# Patient Record
Sex: Male | Born: 1935 | Race: White | Hispanic: No | Marital: Married | Smoking: Former smoker
Health system: Southern US, Community
[De-identification: ages and names within clinical notes are randomized; demographics above are authoritative.]

## PROBLEM LIST (undated history)

## (undated) DIAGNOSIS — M1990 Unspecified osteoarthritis, unspecified site: Secondary | ICD-10-CM

## (undated) DIAGNOSIS — K5792 Diverticulitis of intestine, part unspecified, without perforation or abscess without bleeding: Secondary | ICD-10-CM

## (undated) DIAGNOSIS — K219 Gastro-esophageal reflux disease without esophagitis: Secondary | ICD-10-CM

## (undated) DIAGNOSIS — C61 Malignant neoplasm of prostate: Secondary | ICD-10-CM

## (undated) DIAGNOSIS — I10 Essential (primary) hypertension: Secondary | ICD-10-CM

## (undated) DIAGNOSIS — B029 Zoster without complications: Secondary | ICD-10-CM

## (undated) DIAGNOSIS — E7800 Pure hypercholesterolemia, unspecified: Secondary | ICD-10-CM

## (undated) HISTORY — DX: Zoster without complications: B02.9

## (undated) HISTORY — PX: PROSTATE SURGERY: SHX751

## (undated) HISTORY — DX: Essential (primary) hypertension: I10

## (undated) HISTORY — PX: BACK SURGERY: SHX140

## (undated) HISTORY — DX: Malignant neoplasm of prostate: C61

## (undated) HISTORY — DX: Unspecified osteoarthritis, unspecified site: M19.90

## (undated) HISTORY — DX: Diverticulitis of intestine, part unspecified, without perforation or abscess without bleeding: K57.92

## (undated) HISTORY — DX: Pure hypercholesterolemia, unspecified: E78.00

## (undated) HISTORY — DX: Gastro-esophageal reflux disease without esophagitis: K21.9

## (undated) HISTORY — PX: COLONOSCOPY: SHX174

---

## 1958-01-08 HISTORY — PX: APPENDECTOMY: SHX54

## 1958-01-08 HISTORY — PX: TONSILLECTOMY: SUR1361

## 1968-01-09 HISTORY — PX: INGUINAL HERNIA REPAIR: SUR1180

## 1998-02-22 ENCOUNTER — Ambulatory Visit (HOSPITAL_COMMUNITY): Admission: RE | Admit: 1998-02-22 | Discharge: 1998-02-22 | Payer: Self-pay | Admitting: Cardiology

## 2000-06-05 ENCOUNTER — Ambulatory Visit (HOSPITAL_COMMUNITY): Admission: RE | Admit: 2000-06-05 | Discharge: 2000-06-05 | Payer: Self-pay | Admitting: Internal Medicine

## 2000-06-05 ENCOUNTER — Encounter: Payer: Self-pay | Admitting: Internal Medicine

## 2001-07-14 ENCOUNTER — Encounter: Admission: RE | Admit: 2001-07-14 | Discharge: 2001-07-14 | Payer: Self-pay | Admitting: Internal Medicine

## 2001-07-14 ENCOUNTER — Encounter: Payer: Self-pay | Admitting: Internal Medicine

## 2005-08-22 ENCOUNTER — Emergency Department (HOSPITAL_COMMUNITY): Admission: EM | Admit: 2005-08-22 | Discharge: 2005-08-22 | Payer: Self-pay | Admitting: Emergency Medicine

## 2006-03-18 ENCOUNTER — Ambulatory Visit: Payer: Self-pay | Admitting: Internal Medicine

## 2006-05-02 ENCOUNTER — Ambulatory Visit: Payer: Self-pay | Admitting: Internal Medicine

## 2006-05-02 DIAGNOSIS — K648 Other hemorrhoids: Secondary | ICD-10-CM | POA: Insufficient documentation

## 2006-05-02 DIAGNOSIS — K5730 Diverticulosis of large intestine without perforation or abscess without bleeding: Secondary | ICD-10-CM | POA: Insufficient documentation

## 2006-07-08 ENCOUNTER — Inpatient Hospital Stay (HOSPITAL_COMMUNITY): Admission: RE | Admit: 2006-07-08 | Discharge: 2006-07-09 | Payer: Self-pay | Admitting: Urology

## 2006-07-08 ENCOUNTER — Encounter (INDEPENDENT_AMBULATORY_CARE_PROVIDER_SITE_OTHER): Payer: Self-pay | Admitting: Urology

## 2006-07-23 ENCOUNTER — Ambulatory Visit (HOSPITAL_COMMUNITY): Admission: RE | Admit: 2006-07-23 | Discharge: 2006-07-23 | Payer: Self-pay | Admitting: Internal Medicine

## 2007-12-31 DIAGNOSIS — K219 Gastro-esophageal reflux disease without esophagitis: Secondary | ICD-10-CM | POA: Insufficient documentation

## 2007-12-31 DIAGNOSIS — E109 Type 1 diabetes mellitus without complications: Secondary | ICD-10-CM | POA: Insufficient documentation

## 2007-12-31 DIAGNOSIS — I10 Essential (primary) hypertension: Secondary | ICD-10-CM | POA: Insufficient documentation

## 2007-12-31 DIAGNOSIS — M129 Arthropathy, unspecified: Secondary | ICD-10-CM | POA: Insufficient documentation

## 2008-01-08 ENCOUNTER — Ambulatory Visit: Payer: Self-pay | Admitting: Internal Medicine

## 2008-01-08 DIAGNOSIS — Z860100 Personal history of colon polyps, unspecified: Secondary | ICD-10-CM | POA: Insufficient documentation

## 2008-01-08 DIAGNOSIS — K5732 Diverticulitis of large intestine without perforation or abscess without bleeding: Secondary | ICD-10-CM | POA: Insufficient documentation

## 2009-05-16 DIAGNOSIS — D126 Benign neoplasm of colon, unspecified: Secondary | ICD-10-CM | POA: Insufficient documentation

## 2009-05-16 DIAGNOSIS — H9190 Unspecified hearing loss, unspecified ear: Secondary | ICD-10-CM | POA: Insufficient documentation

## 2009-05-16 DIAGNOSIS — C61 Malignant neoplasm of prostate: Secondary | ICD-10-CM | POA: Insufficient documentation

## 2010-05-23 NOTE — Op Note (Signed)
NAMEBRODAN, GREWELL NO.:  [id number]   MEDICAL RECORD NO.:  [id number]          PATIENT TYPE:  INP   LOCATION:  X010                         FACILITY:  [facility]   PHYSICIAN:  Heloise Purpura, MD      DATE OF BIRTH:  September 25, 1935   DATE OF PROCEDURE:  07/08/2006  DATE OF DISCHARGE:                               OPERATIVE REPORT   PREOPERATIVE DIAGNOSIS:  Clinically localized adenocarcinoma of the  prostate.   POSTOPERATIVE DIAGNOSIS:  Clinically localized adenocarcinoma of the  prostate.   PROCEDURE:  Robotic-assisted laparoscopic radical prostatectomy  (bilateral nerve-sparing).   SURGEON:  Crecencio Mc, MD   ASSISTANT:  Terie Purser, MD   ANESTHESIA:  General.   COMPLICATIONS:  None.   ESTIMATED BLOOD LOSS:  150 mL.   INTRAVENOUS FLUIDS:  2300 mL of lactated Ringer's.   SPECIMENS:  Prostate and seminal vesicles.   DISPOSITION:  Specimen to pathology.   DRAINS:  1. 20-French straight catheter.  2. #19 Blake pelvic drain.   INDICATIONS:  Mark Bowen is a 75 year old gentleman with clinical stage  T1C prostate cancer with a PSA of 2.9 and a Gleason score of 3+3=6.  His  pretreatment AUA Symptom Score was 1 with an IIEF Score of 25/25.  After  discussing management options for clinically localized prostate cancer,  the patient elected to proceed with surgical therapy and the above  procedure.  Potential risks and benefits were discussed with the patient  and informed consent was obtained.   DESCRIPTION OF PROCEDURE:  The patient was taken to the operating room  and a general anesthetic was administered.  He was given preoperative  antibiotics, placed in the dorsal lithotomy position, and prepped and  draped in the usual sterile fashion.  Next a preoperative time-out was  performed.  A Foley catheter was then inserted into the bladder.  A site  was selected just to left of the umbilicus for placement of the camera  port.  This was placed using a standard  open Hasson technique.  This  allowed entry into the peritoneal cavity under direct vision without  difficulty.  A 12 mm port was then placed and a pneumoperitoneum was  established.  A 0-degree lens was then used to inspect the abdomen and  there was no evidence of any intra-abdominal injuries or other  abnormalities.  The remaining ports were then placed.  Bilateral 8 mm  robotic ports were placed 16 cm from the pubic symphysis and 10 cm  lateral to the camera port.  An additional 8 mm robotic port was placed  in the far left lateral abdominal wall.  A 5 mm port was placed between  the camera port and the right robotic port.  An additional 12 mm port  was placed in the far right lateral abdominal wall.  All ports were  placed under direct vision and without difficulty.  The surgical cart  was then docked.  With the aid of the cautery scissors, the bladder was  reflected posteriorly allowing entry into the space of Retzius and  identification of the endopelvic fascia.  The endopelvic fascia was  incised from the apex back to the base of the prostate bilaterally and  the underlying levator muscle fibers were swept laterally off the  prostate.  This isolated the dorsal venous complex, which was then  stapled and divided with a 45-mm flex ETS stapler.  The bladder neck was  identified with the aid of Foley catheter manipulation.  The bladder  neck was divided anteriorly and the catheter was exposed.  The catheter  balloon was deflated and the catheter was brought into the operative  field and used to retract the prostate anteriorly.  The posterior  bladder neck was then divided and dissection continued between the  bladder and prostate until the vasa deferentia and seminal vesicles were  identified.  The vasa deferentia were isolated, divided and lifted  anteriorly.  The seminal vesicles were then lifted anteriorly as well  after care was taken to control the seminal vesicle arterial blood   supply.  The space between Denonvilliers fascia and the anterior rectum  was then bluntly developed, thereby isolating the vascular pedicles of  the prostate.  The lateral prostatic fascia was incised bilaterally and  the neurovascular bundles were swept laterally and posteriorly off the  prostate.  The vascular pedicles of the prostate were then ligated with  Hem-o-lok clips above the level of the neurovascular bundles and divided  with sharp cold scissor dissection.  The neurovascular bundles were  swept off the apex of the prostate and urethra and the urethra was  sharply divided, allowing the prostate to be disarticulated.  The pelvis  was then copiously irrigated and hemostasis was ensured.  With  irrigation in the pelvis, air was injected into the rectal catheter and  there was no evidence of a rectal injury.  There was noted to be an  arterial bleeding site off the right neurovascular bundle.  This was  oversewn with a figure-of-eight 3-0 Vicryl suture.  The remainder of  this 3-0 Vicryl suture was then used to reapproximate Denonvilliers  fascia to the posterior urethral tissue.  A 2-0 Vicryl slip-knot was  then placed at the 6 o'clock position between the bladder neck and  urethra to reapproximate these structures.  A double-armed 3-0 Monocryl  suture was then used to perform a 360-degree running tension-free  anastomosis between the bladder neck and urethra.  A 20-French Coude  catheter was then inserted into the bladder and the catheter was  irrigated.  There were no blood clots within the bladder and the  anastomosis appeared to be watertight.  A #19 Blake drain was then  brought through the left robotic port and appropriately positioned in  the pelvis.  It was secured at the skin with a nylon suture.  The  surgical cart was then undocked.  The right lateral 12 mm port site was  closed with a 0 Vicryl suture placed with the aid of the suture passer  device.  All remaining ports  were removed under direct vision.  The  prostate specimen was then removed intact within the Endopouch retrieval  bag via the periumbilical port site.  This fascial opening was then  closed with a running 0 Vicryl suture.  All port sites were injected  with 0.25% Marcaine and reapproximated at the skin level with staples.  Sterile dressings were applied.  The patient appeared to tolerate the  procedure well and without complications.  He was able to be extubated  and transferred to the recovery unit in satisfactory  condition.           ______________________________  Heloise Purpura, MD  Electronically Signed     LB/MEDQ  D:  07/08/2006  T:  07/09/2006  Job:  [id number]

## 2010-05-23 NOTE — H&P (Signed)
NAME:  Mark Bowen, Mark Bowen NO.:  [id number]   MEDICAL RECORD NO.:  [id number]          PATIENT TYPE:  INP   LOCATION:  NA                           FACILITY:  [facility]   PHYSICIAN:  Heloise Purpura, MD      DATE OF BIRTH:  09-24-1935   DATE OF ADMISSION:  DATE OF DISCHARGE:                              HISTORY & PHYSICAL   CHIEF COMPLAINT:  Prostate cancer.   HISTORY:  Mark Bowen is a 75 year old gentleman with clinical stage T1c  prostate cancer with a PSA of 2.9 and Gleason score 3 + 3 = 6  adenocarcinoma.  After discussion regarding management options for  clinically localized prostate cancer, the patient elected to proceed  with surgical therapy and a robotic prostatectomy.   PAST MEDICAL HISTORY:  1. Hypertension.  2. Diabetes.  3. Arthritis.  4. Gastroesophageal reflux disease.   PAST SURGICAL HISTORY:  1. Bilateral inguinal hernia repair.  2. Appendectomy.  3. Back surgery.  4. Tonsillectomy.   MEDICATIONS:  1. Lovastatin.  2. Metformin.  3. Omeprazole.  4. Benazepril/hydrochlorothiazide.  5. Aspirin, which is on hold.  6. Multivitamin, which is on hold.  7. Potassium supplement.  8. Cinnamon supplement, which is on hold.  9. Omega 3, which is on hold.  10.Fish oil, which is on hold.  11.Glucosamine, which is on hold.   ALLERGIES:  IV CONTRAST, which results in hives.   FAMILY HISTORY:  There is an extensive family history for prostate  cancer.  The patient has three brothers who have all been diagnosed with  prostate cancer.  The patient's father also had prostate cancer.  No  family members have died of prostate cancer.  His mother did live to be  age 52.   SOCIAL HISTORY:  The patient is currently retired.  He does work in  Animal nutritionist.  He did smoke one pack of cigarettes for 8 years  and quit 4-5 years ago.  He drinks alcohol only occasionally.   REVIEW OF SYSTEMS:  A complete review of systems was performed.  Pertinent positives  include history of sinus problems, joint pain and  heartburn.  All other systems are reviewed and otherwise negative.   PHYSICAL EXAMINATION:  CONSTITUTIONAL:  Well-nourished, well-developed,  age-appropriate male in no acute distress.  CARDIOVASCULAR:  Regular rate and rhythm without obvious murmurs.  LUNGS:  Clear bilaterally.  ABDOMEN:  Soft, nontender, nondistended without abdominal masses.  He  has well-healed right lower quadrant appendectomy scar.  DIGITAL RECTAL EXAM:  No nodularity or induration.   IMPRESSION:  Clinically localized adenocarcinoma of prostate.   PLAN:  Mark Bowen will undergo robotic-assisted laparoscopic radical  prostatectomy and then be admitted to the hospital for routine  postoperative care.           ______________________________  Heloise Purpura, MD  Electronically Signed     LB/MEDQ  D:  07/08/2006  T:  07/08/2006  Job:  [id number]

## 2010-05-23 NOTE — Discharge Summary (Signed)
NAMETEAGON, KRON NO.:  [id number]   MEDICAL RECORD NO.:  [id number]          PATIENT TYPE:  INP   LOCATION:  1409                         FACILITY:  [facility]   PHYSICIAN:  Heloise Purpura, MD      DATE OF BIRTH:  1935/04/05   DATE OF ADMISSION:  07/08/2006  DATE OF DISCHARGE:  07/09/2006                               DISCHARGE SUMMARY   ADMISSION DIAGNOSIS:  Prostate cancer.   DISCHARGE DIAGNOSIS:  Prostate cancer.   PROCEDURE:  Robotic-assisted laparoscopic radical prostatectomy.   HISTORY AND PHYSICAL:  For full details, please see admission history  and physical.  Briefly, Mr. Sailors is a 75 year old gentleman with  clinically localized prostate cancer.  After discussing management  options for clinically localized disease, he elected to proceed with  surgical therapy and a robotic-assisted laparoscopic radical  prostatectomy.   HOSPITAL COURSE:  On 07/08/2006, the patient was taken to the operating  room, and the above procedure was performed.  The patient tolerated this  procedure without difficulty and without complications.  Postoperatively, he was able to be transferred to a regular hospital  room following recovery from anesthesia.  He was able to begin  ambulating the night of surgery.  On the morning of postoperative day  #1, he had maintained excellent urine output from his Foley catheter,  with minimal output from his pelvic drain.  Pelvic drain was therefore  removed.  He was able to begin a clear liquid diet which he tolerated  without difficulty, and he was transitioned to oral pain medication.  He  was instructed on routine Foley catheter care.  In addition, he remained  hemodynamically stable, and his hemoglobin levels hemoglobin levels were  adequate on postoperative day #1.  He was therefore discharged home in  excellent condition.   DISPOSITION:  Home.   DISCHARGE MEDICATIONS:  The patient was instructed to resume his regular  home  medications excepting any aspirin, nonsteroidal anti-inflammatory  drugs, or herbal supplements.  He was given a prescription to take  Vicodin as needed for pain, told to take Colace as a stool softener, and  was instructed to begin Cipro 1 day prior to his return visit for Foley  catheter removal.   DISCHARGE INSTRUCTIONS:  The patient was instructed to be ambulatory but  specifically told to refrain from any heavy lifting, strenuous activity,  or driving.  He was told to gradually advance his diet, once passing  flatus.  He was also instructed on routine Foley catheter care.   FOLLOW UP:  Mr. Madan will follow-up in 1 week for Foley catheter  removal and to discuss his surgical pathology in detail.   ADDENDUM:  Mr. Michalski was noted on his preoperative chest x-ray to have a  possible lung mass.  A repeat chest x-ray performed with nipple markers  demonstrated this lesion to be most likely due to a nipple shadow.  However,  there was noted to be some increased markings suggestive of increased  vascularity, and it was recommended that this be further evaluated.  I,  therefore, discussed this with Mr.  Berkheimer and recommended that Dr. Waynard Edwards  be allowed to follow up on this and determine further evaluation.  I  will plan to send Dr. Waynard Edwards a copy of his chest x-ray report.           ______________________________  Heloise Purpura, MD  Electronically Signed     LB/MEDQ  D:  07/09/2006  T:  07/09/2006  Job:  [id number]

## 2010-10-06 ENCOUNTER — Other Ambulatory Visit: Payer: Self-pay | Admitting: Dermatology

## 2010-10-24 LAB — HEMOGLOBIN AND HEMATOCRIT, BLOOD
HCT: 36.5 — ABNORMAL LOW
Hemoglobin: 12.5 — ABNORMAL LOW

## 2010-10-25 LAB — URINALYSIS, ROUTINE W REFLEX MICROSCOPIC
Ketones, ur: NEGATIVE
Protein, ur: NEGATIVE
Urobilinogen, UA: 0.2

## 2010-10-25 LAB — CBC
MCHC: 34.6
Platelets: 206
RBC: 5.34
WBC: 6.2

## 2010-10-25 LAB — BASIC METABOLIC PANEL
BUN: 10
CO2: 31
Calcium: 9.8
Creatinine, Ser: 0.92
GFR calc Af Amer: >60

## 2010-10-25 LAB — TYPE AND SCREEN: ABO/RH(D): O POS

## 2010-10-25 LAB — ABO/RH: ABO/RH(D): O POS

## 2011-02-14 ENCOUNTER — Encounter: Payer: Self-pay | Admitting: Internal Medicine

## 2011-02-27 ENCOUNTER — Encounter: Payer: Self-pay | Admitting: Internal Medicine

## 2011-04-04 ENCOUNTER — Ambulatory Visit (AMBULATORY_SURGERY_CENTER): Payer: Medicare Other

## 2011-04-04 VITALS — Ht 69.0 in | Wt 177.0 lb

## 2011-04-04 DIAGNOSIS — Z1211 Encounter for screening for malignant neoplasm of colon: Secondary | ICD-10-CM

## 2011-04-04 DIAGNOSIS — Z860100 Personal history of colon polyps, unspecified: Secondary | ICD-10-CM

## 2011-04-04 MED ORDER — PEG-KCL-NACL-NASULF-NA ASC-C 100 G PO SOLR: 1.0000 | Freq: Once | ORAL | Status: DC

## 2011-04-18 ENCOUNTER — Encounter: Payer: Self-pay | Admitting: Internal Medicine

## 2011-04-18 ENCOUNTER — Ambulatory Visit (AMBULATORY_SURGERY_CENTER): Payer: Medicare Other | Admitting: Internal Medicine

## 2011-04-18 VITALS — BP 136/80 | HR 57 | Temp 97.4°F | Resp 14 | Ht 69.0 in | Wt 177.0 lb

## 2011-04-18 DIAGNOSIS — Z1211 Encounter for screening for malignant neoplasm of colon: Secondary | ICD-10-CM

## 2011-04-18 DIAGNOSIS — Z86010 Personal history of colonic polyps: Secondary | ICD-10-CM

## 2011-04-18 DIAGNOSIS — K5730 Diverticulosis of large intestine without perforation or abscess without bleeding: Secondary | ICD-10-CM

## 2011-04-18 LAB — GLUCOSE, CAPILLARY: Glucose-Capillary: 127 mg/dL — ABNORMAL HIGH (ref 70–99)

## 2011-04-18 MED ORDER — SODIUM CHLORIDE 0.9 % IV SOLN: 500.0000 mL | INTRAVENOUS | Status: DC

## 2011-04-18 NOTE — Progress Notes (Signed)
Patient did not experience any of the following events: a burn prior to discharge; a fall within the facility; wrong site/side/patient/procedure/implant event; or a hospital transfer or hospital admission upon discharge from the facility. (G8907) Patient did not have preoperative order for IV antibiotic SSI prophylaxis. (G8918)  

## 2011-04-18 NOTE — Patient Instructions (Signed)
YOU HAD AN ENDOSCOPIC PROCEDURE TODAY AT THE [facility]: Refer to the procedure report that was given to you for any specific questions about what was found during the examination.  If the procedure report does not answer your questions, please call your gastroenterologist to clarify.  If you requested that your care partner not be given the details of your procedure findings, then the procedure report has been included in a sealed envelope for you to review at your convenience later.  YOU SHOULD EXPECT: Some feelings of bloating in the abdomen. Passage of more gas than usual.  Walking can help get rid of the air that was put into your GI tract during the procedure and reduce the bloating. If you had a lower endoscopy (such as a colonoscopy or flexible sigmoidoscopy) you may notice spotting of blood in your stool or on the toilet paper. If you underwent a bowel prep for your procedure, then you may not have a normal bowel movement for a few days.  DIET: Your first meal following the procedure should be a light meal and then it is ok to progress to your normal diet.  A half-sandwich or bowl of soup is an example of a good first meal.  Heavy or fried foods are harder to digest and may make you feel nauseous or bloated.  Likewise meals heavy in dairy and vegetables can cause extra gas to form and this can also increase the bloating.  Drink plenty of fluids but you should avoid alcoholic beverages for 24 hours.  ACTIVITY: Your care partner should take you home directly after the procedure.  You should plan to take it easy, moving slowly for the rest of the day.  You can resume normal activity the day after the procedure however you should NOT DRIVE or use heavy machinery for 24 hours (because of the sedation medicines used during the test).    SYMPTOMS TO REPORT IMMEDIATELY: A gastroenterologist can be reached at any hour.  During normal business hours, 8:30 AM to 5:00 PM Monday through Friday,  call [phone].  After hours and on weekends, please call the GI answering service at [phone] who will take a message and have the physician on call contact you.   Following lower endoscopy (colonoscopy or flexible sigmoidoscopy):  Excessive amounts of blood in the stool  Significant tenderness or worsening of abdominal pains  Swelling of the abdomen that is new, acute  Fever of 100F or higher  Following upper endoscopy (EGD)  Vomiting of blood or coffee ground material  New chest pain or pain under the shoulder blades  Painful or persistently difficult swallowing  New shortness of breath  Fever of 100F or higher  Black, tarry-looking stools  FOLLOW UP: If any biopsies were taken you will be contacted by phone or by letter within the next 1-3 weeks.  Call your gastroenterologist if you have not heard about the biopsies in 3 weeks.  Our staff will call the home number listed on your records the next business day following your procedure to check on you and address any questions or concerns that you may have at that time regarding the information given to you following your procedure. This is a courtesy call and so if there is no answer at the home number and we have not heard from you through the emergency physician on call, we will assume that you have returned to your regular daily activities without incident.  SIGNATURES/CONFIDENTIALITY: You and/or your care   partner have signed paperwork which will be entered into your electronic medical record.  These signatures attest to the fact that that the information above on your After Visit Summary has been reviewed and is understood.  Full responsibility of the confidentiality of this discharge information lies with you and/or your care-partner.  

## 2011-04-18 NOTE — Op Note (Signed)
[facility] [address] [place], [place]  [address]  COLONOSCOPY PROCEDURE REPORT  PATIENT:  Mark Bowen, Mark Bowen  MR#:  [id number] BIRTHDATE:  1935-05-12, 75 yrs. old  GENDER:  male ENDOSCOPIST:  Wilhemina Bonito. Eda Keys, MD REF. BY:  Surveillance Program Recall, PROCEDURE DATE:  04/18/2011 PROCEDURE:  Surveillance Colonoscopy ASA CLASS:  Class II INDICATIONS:  history of pre-cancerous (adenomatous) colon polyps, surveillance and high-risk screening ; index 2004 (TA); f/u 2008 MEDICATIONS:   MAC sedation, administered by CRNA, propofol (Diprivan) 180 mg IV  DESCRIPTION OF PROCEDURE:   After the risks benefits and alternatives of the procedure were thoroughly explained, informed consent was obtained.  Digital rectal exam was performed and revealed no abnormalities.   The LB CF-H180AL [id number] endoscope was introduced through the anus and advanced to the cecum, which was identified by both the appendix and ileocecal valve, without limitations.  The quality of the prep was excellent, using MoviPrep.  The instrument was then slowly withdrawn as the colon was fully examined. <<PROCEDUREIMAGES>>  FINDINGS:  Moderate diverticulosis was found throughout the colon. A tiny A.V. 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  Otherwise normal colonoscopy without polyps, masses, vascular ectasias, or inflammatory changes. Retroflexed views in the rectum revealed internal hemorrhoids.  The time to cecum = 2:19  minutes. The scope was then withdrawn in 11:11  minutes from the cecum and the procedure completed.  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 Eda Keys, MD  CC:  Rodrigo Ran, MD;  The Patient  n. eSIGNED:   Wilhemina Bonito. Eda Keys at 04/18/2011 10:32 AM  Joselyn Glassman, [id number]

## 2011-04-19 ENCOUNTER — Telehealth: Payer: Self-pay | Admitting: *Deleted

## 2011-04-19 NOTE — Telephone Encounter (Signed)
  Follow up Call-  Call back number 04/18/2011  Post procedure Call Back phone  # [phone]  Permission to leave phone message Yes     Patient questions:  Do you have a fever, pain , or abdominal swelling? no Pain Score  0 *  Have you tolerated food without any problems? yes  Have you been able to return to your normal activities? yes  Do you have any questions about your discharge instructions: Diet   no Medications  no Follow up visit  no  Do you have questions or concerns about your Care? no  Actions: * If pain score is 4 or above: No action needed, pain <4.

## 2011-10-29 DIAGNOSIS — R5383 Other fatigue: Secondary | ICD-10-CM | POA: Insufficient documentation

## 2013-11-30 DIAGNOSIS — F329 Major depressive disorder, single episode, unspecified: Secondary | ICD-10-CM | POA: Insufficient documentation

## 2014-06-02 ENCOUNTER — Encounter: Payer: Self-pay | Admitting: Podiatry

## 2014-06-02 ENCOUNTER — Ambulatory Visit (INDEPENDENT_AMBULATORY_CARE_PROVIDER_SITE_OTHER): Payer: Medicare Other | Admitting: Podiatry

## 2014-06-02 VITALS — BP 120/74 | HR 64 | Resp 12

## 2014-06-02 DIAGNOSIS — B351 Tinea unguium: Secondary | ICD-10-CM

## 2014-06-02 DIAGNOSIS — M79676 Pain in unspecified toe(s): Secondary | ICD-10-CM | POA: Diagnosis not present

## 2014-06-02 NOTE — Patient Instructions (Signed)
We discussed treatment options for fungal toenails including no treatment , repetitive debridement or possible oral medication  Diabetes and Foot Care Diabetes may cause you to have problems because of poor blood supply (circulation) to your feet and legs. This may cause the skin on your feet to become thinner, break easier, and heal more slowly. Your skin may become dry, and the skin may peel and crack. You may also have nerve damage in your legs and feet causing decreased feeling in them. You may not notice minor injuries to your feet that could lead to infections or more serious problems. Taking care of your feet is one of the most important things you can do for yourself.  HOME CARE INSTRUCTIONS  Wear shoes at all times, even in the house. Do not go barefoot. Bare feet are easily injured.  Check your feet daily for blisters, cuts, and redness. If you cannot see the bottom of your feet, use a mirror or ask someone for help.  Wash your feet with warm water (do not use hot water) and mild soap. Then pat your feet and the areas between your toes until they are completely dry. Do not soak your feet as this can dry your skin.  Apply a moisturizing lotion or petroleum jelly (that does not contain alcohol and is unscented) to the skin on your feet and to dry, brittle toenails. Do not apply lotion between your toes.  Trim your toenails straight across. Do not dig under them or around the cuticle. File the edges of your nails with an emery board or nail file.  Do not cut corns or calluses or try to remove them with medicine.  Wear clean socks or stockings every day. Make sure they are not too tight. Do not wear knee-high stockings since they may decrease blood flow to your legs.  Wear shoes that fit properly and have enough cushioning. To break in new shoes, wear them for just a few hours a day. This prevents you from injuring your feet. Always look in your shoes before you put them on to be sure there  are no objects inside.  Do not cross your legs. This may decrease the blood flow to your feet.  If you find a minor scrape, cut, or break in the skin on your feet, keep it and the skin around it clean and dry. These areas may be cleansed with mild soap and water. Do not cleanse the area with peroxide, alcohol, or iodine.  When you remove an adhesive bandage, be sure not to damage the skin around it.  If you have a wound, look at it several times a day to make sure it is healing.  Do not use heating pads or hot water bottles. They may burn your skin. If you have lost feeling in your feet or legs, you may not know it is happening until it is too late.  Make sure your health care provider performs a complete foot exam at least annually or more often if you have foot problems. Report any cuts, sores, or bruises to your health care provider immediately. SEEK MEDICAL CARE IF:   You have an injury that is not healing.  You have cuts or breaks in the skin.  You have an ingrown nail.  You notice redness on your legs or feet.  You feel burning or tingling in your legs or feet.  You have pain or cramps in your legs and feet.  Your legs or feet are  numb.  Your feet always feel cold. SEEK IMMEDIATE MEDICAL CARE IF:   There is increasing redness, swelling, or pain in or around a wound.  There is a red line that goes up your leg.  Pus is coming from a wound.  You develop a fever or as directed by your health care provider.  You notice a bad smell coming from an ulcer or wound. Document Released: 12/23/1999 Document Revised: 08/27/2012 Document Reviewed: 06/03/2012 [facility] Patient Information 2015 [place], [place]. This information is not intended to replace advice given to you by your health care provider. Make sure you discuss any questions you have with your health care provider.

## 2014-06-02 NOTE — Progress Notes (Signed)
   Subjective:    Patient ID: Mark Bowen, male    DOB: 03-Feb-1935, 79 y.o.   MRN: [id number]  HPI  N-THICK, DISCOLORATION L-B/L TOENAILS D-LONG-TERM O-SLOWLY C-WORSE, THICKER A-PRESSURE WITH SHOES T-TRIM  Review of Systems  All other systems reviewed and are negative.  Patient denies any history of foot ulceration or claudication    Objective:   Physical Exam  Orientated 3  Vascular: DP and PT pulses 2/4 bilaterally Capillary reflex immediate bilaterally No peripheral edema noted bilaterally  Neurological: Sensation to 10 g monofilament wire intact 5/5 bilaterally Ankle reflex equal and reactive bilaterally Vibratory sensation intact bilaterally  Dermatological: Dry scaling skin plantarly bilaterally The toenails are elongated, discolored, brittle, hypertrophic and tender to direct palpation 6-10  Musculoskeletal: HAV deformities bilaterally Hammertoe second bilaterally There is no restriction ankle, subtalar, midtarsal joints bilaterally    Assessment & Plan:   Assessment: Satisfactory neurovascular status Tinea pedis bilaterally Symptomatic onychomycoses 6-10  Plan: Review the results with patient today Discussed treatment options for fungal toenails and at this time patient is requesting nail debridement without active treatment Debride toenails 10 without any bleeding  Reappoint 3 months

## 2014-07-14 DIAGNOSIS — R1032 Left lower quadrant pain: Secondary | ICD-10-CM | POA: Insufficient documentation

## 2014-08-10 DIAGNOSIS — M4716 Other spondylosis with myelopathy, lumbar region: Secondary | ICD-10-CM | POA: Insufficient documentation

## 2014-09-08 ENCOUNTER — Ambulatory Visit: Payer: Medicare Other | Admitting: Podiatry

## 2015-01-06 ENCOUNTER — Encounter: Payer: Self-pay | Admitting: Internal Medicine

## 2016-03-02 ENCOUNTER — Encounter: Payer: Self-pay | Admitting: Internal Medicine

## 2016-04-02 ENCOUNTER — Ambulatory Visit (INDEPENDENT_AMBULATORY_CARE_PROVIDER_SITE_OTHER): Payer: Medicare Other | Admitting: Internal Medicine

## 2016-04-02 ENCOUNTER — Encounter (INDEPENDENT_AMBULATORY_CARE_PROVIDER_SITE_OTHER): Payer: Self-pay

## 2016-04-02 ENCOUNTER — Encounter: Payer: Self-pay | Admitting: Internal Medicine

## 2016-04-02 VITALS — BP 136/74 | HR 68 | Ht 68.0 in | Wt 168.0 lb

## 2016-04-02 DIAGNOSIS — K219 Gastro-esophageal reflux disease without esophagitis: Secondary | ICD-10-CM | POA: Diagnosis not present

## 2016-04-02 DIAGNOSIS — R197 Diarrhea, unspecified: Secondary | ICD-10-CM

## 2016-04-02 DIAGNOSIS — Z86010 Personal history of colonic polyps: Secondary | ICD-10-CM | POA: Diagnosis not present

## 2016-04-02 DIAGNOSIS — K5732 Diverticulitis of large intestine without perforation or abscess without bleeding: Secondary | ICD-10-CM

## 2016-04-02 NOTE — Progress Notes (Signed)
HISTORY OF PRESENT ILLNESS:  Mark Bowen is a 81 y.o. male , Mark Bowen, with a history of hypertension, diabetes, arthritis, GERD, prostate cancer, adenomatous colon polyps, and diverticulosis complicated by diverticulitis. The patient presents today with a chief complaint of recurrent diverticulitis and transient diarrhea. He was last seen in 2013. He is sent by his primary care provider Dr. Joylene Draft. The patient underwent index colonoscopy 2004 and was found to have diverticulosis and adenomatous colon polyp. Follow-up exam in 2008 negative for neoplasia. Seen in the office 2009 for recurrent diverticulitis. CT confirmation of the same. Had been doing well until this past year when he has experienced 3 episodes of recurrent diverticulitis requiring antibiotics. After his most recent bout he had difficulties with diarrhea. Clostridium difficile negative. Empirically treated with Flagyl. The probiotic. Diarrhea has resolved. No further problems with pain. He has questions regarding diverticular disease. A friendly his had surgery. His last colonoscopy was in 2013. 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 Moderate diverticulosis throughout. No neoplasia. Follow-up in 5 years recommended. Patient also has a history of GERD which he takes omeprazole daily. No breakthrough symptoms. No dysphagia  REVIEW OF SYSTEMS:  All non-GI ROS negative except for urine problems, itching, arthritis  Past Medical History:  Diagnosis Date  . Arthritis   . Diabetes mellitus 2007   type 2  . Diverticulitis   . GERD (gastroesophageal reflux disease)   . Hypercholesterolemia since 2008 preventative   taking Zocor, now cholesterol WNL  . Hypertension since 1970  . Prostate CA ([place])   . Shingles     Past Surgical History:  Procedure Laterality Date  . APPENDECTOMY  1960  . [place] & 2009  . INGUINAL HERNIA REPAIR  1970   bilateral  . PROSTATE SURGERY    . TONSILLECTOMY  1960    Social History Mark Bowen   reports that he quit smoking about 57 years ago. He has never used smokeless tobacco. He reports that he does not drink alcohol or use drugs.  family history includes Lung cancer in his father; Prostate cancer in his brother.  Allergies  Allergen Reactions  . Iodine   . Iohexol      Code: HIVES, Desc: pt. states he breaks out in hives with iv dye        PHYSICAL EXAMINATION: Vital signs: BP 136/74   Pulse 68   Ht 5\' 8"  (1.727 m)   Wt 168 lb (76.2 kg)   BMI 25.54 kg/m   Constitutional: generally well-appearing, no acute distress Psychiatric: alert and oriented x3, cooperative Eyes: extraocular movements intact, anicteric, conjunctiva pink Mouth: oral pharynx moist, no lesions Neck: supple no lymphadenopathy Cardiovascular: heart regular rate and rhythm, no murmur Lungs: clear to auscultation bilaterally Abdomen: soft, nontender, nondistended, no obvious ascites, no peritoneal signs, normal bowel sounds, no organomegaly Rectal:Deferred until colonoscopy Extremities: no clubbing cyanosis or lower extremity edema bilaterally Skin: no lesions on visible extremities Neuro: No focal deficits. Normal DTRs. Cranial nerves intact  ASSESSMENT:  #1. Recurrent diverticulitis without complications. Currently asymptomatic #2. Transient diarrhea. Antibiotic-related. Resolved. Discussed #3. History of adenomatous colon polyps. Last colonoscopy 5 years ago. Due for surveillance #4. GERD. Asymptomatic on PPI  PLAN:  #1. Discussion on diverticular disease including the role of surgery #2. Recommend fiber supplementation with Metamucil one to 2 tablespoons daily #3. If the patient experiences a significant recurrent bout of diverticulitis would recommend CT imaging to rule out any complicating features #4. Schedule surveillance colonoscopy.The nature  of the procedure, as well as the risks, benefits, and alternatives were carefully and thoroughly reviewed with the patient. Ample time for  discussion and questions allowed. The patient understood, was satisfied, and agreed to proceed. #5. Hold diabetic medications the day of the examination to avoid and wanted hypoglycemia  A copy of this consultation note has been sent to Dr. Joylene Draft

## 2016-04-02 NOTE — Patient Instructions (Signed)
If you are age 79 or older, your body mass index should be between 23-30. Your Body mass index is 41.45 kg/m. If this is out of the aforementioned range listed, please consider follow up with your Primary Care Provider.  If you are age 43 or younger, your body mass index should be between 19-25. Your Body mass index is 41.45 kg/m. If this is out of the aformentioned range listed, please consider follow up with your Primary Care Provider.   1-2 tablespoons of metamucil daily.  Thank you for choosing [facility]

## 2016-06-10 ENCOUNTER — Inpatient Hospital Stay (HOSPITAL_COMMUNITY)
Admission: EM | Admit: 2016-06-10 | Discharge: 2016-06-15 | DRG: 871 | Disposition: A | Discharge status: Home / Self Care | Payer: Medicare Other | Attending: Internal Medicine | Admitting: Internal Medicine

## 2016-06-10 ENCOUNTER — Emergency Department (HOSPITAL_COMMUNITY): Payer: Medicare Other

## 2016-06-10 ENCOUNTER — Encounter (HOSPITAL_COMMUNITY): Payer: Self-pay | Admitting: Emergency Medicine

## 2016-06-10 DIAGNOSIS — R05 Cough: Secondary | ICD-10-CM

## 2016-06-10 DIAGNOSIS — A419 Sepsis, unspecified organism: Secondary | ICD-10-CM | POA: Diagnosis present

## 2016-06-10 DIAGNOSIS — B349 Viral infection, unspecified: Secondary | ICD-10-CM | POA: Diagnosis present

## 2016-06-10 DIAGNOSIS — E7800 Pure hypercholesterolemia, unspecified: Secondary | ICD-10-CM | POA: Diagnosis present

## 2016-06-10 DIAGNOSIS — Z87891 Personal history of nicotine dependence: Secondary | ICD-10-CM | POA: Diagnosis not present

## 2016-06-10 DIAGNOSIS — R059 Cough, unspecified: Secondary | ICD-10-CM

## 2016-06-10 DIAGNOSIS — K219 Gastro-esophageal reflux disease without esophagitis: Secondary | ICD-10-CM | POA: Diagnosis present

## 2016-06-10 DIAGNOSIS — J209 Acute bronchitis, unspecified: Secondary | ICD-10-CM | POA: Diagnosis present

## 2016-06-10 DIAGNOSIS — E872 Acidosis: Secondary | ICD-10-CM | POA: Diagnosis present

## 2016-06-10 DIAGNOSIS — Z7982 Long term (current) use of aspirin: Secondary | ICD-10-CM

## 2016-06-10 DIAGNOSIS — Z7984 Long term (current) use of oral hypoglycemic drugs: Secondary | ICD-10-CM | POA: Diagnosis not present

## 2016-06-10 DIAGNOSIS — R509 Fever, unspecified: Secondary | ICD-10-CM | POA: Insufficient documentation

## 2016-06-10 DIAGNOSIS — Z91041 Radiographic dye allergy status: Secondary | ICD-10-CM | POA: Diagnosis not present

## 2016-06-10 DIAGNOSIS — M1990 Unspecified osteoarthritis, unspecified site: Secondary | ICD-10-CM | POA: Diagnosis present

## 2016-06-10 DIAGNOSIS — G0490 Encephalitis and encephalomyelitis, unspecified: Secondary | ICD-10-CM | POA: Diagnosis present

## 2016-06-10 DIAGNOSIS — E785 Hyperlipidemia, unspecified: Secondary | ICD-10-CM | POA: Diagnosis present

## 2016-06-10 DIAGNOSIS — Z8546 Personal history of malignant neoplasm of prostate: Secondary | ICD-10-CM | POA: Diagnosis not present

## 2016-06-10 DIAGNOSIS — M791 Myalgia: Secondary | ICD-10-CM | POA: Diagnosis not present

## 2016-06-10 DIAGNOSIS — D696 Thrombocytopenia, unspecified: Secondary | ICD-10-CM | POA: Diagnosis present

## 2016-06-10 DIAGNOSIS — E871 Hypo-osmolality and hyponatremia: Secondary | ICD-10-CM | POA: Diagnosis present

## 2016-06-10 DIAGNOSIS — R519 Headache, unspecified: Secondary | ICD-10-CM

## 2016-06-10 DIAGNOSIS — Z888 Allergy status to other drugs, medicaments and biological substances status: Secondary | ICD-10-CM | POA: Diagnosis not present

## 2016-06-10 DIAGNOSIS — R410 Disorientation, unspecified: Secondary | ICD-10-CM | POA: Diagnosis not present

## 2016-06-10 DIAGNOSIS — Z779 Other contact with and (suspected) exposures hazardous to health: Secondary | ICD-10-CM | POA: Diagnosis not present

## 2016-06-10 DIAGNOSIS — E119 Type 2 diabetes mellitus without complications: Secondary | ICD-10-CM

## 2016-06-10 DIAGNOSIS — W57XXXA Bitten or stung by nonvenomous insect and other nonvenomous arthropods, initial encounter: Secondary | ICD-10-CM | POA: Diagnosis present

## 2016-06-10 DIAGNOSIS — I10 Essential (primary) hypertension: Secondary | ICD-10-CM | POA: Diagnosis present

## 2016-06-10 DIAGNOSIS — R51 Headache: Secondary | ICD-10-CM

## 2016-06-10 DIAGNOSIS — Z8042 Family history of malignant neoplasm of prostate: Secondary | ICD-10-CM | POA: Diagnosis not present

## 2016-06-10 DIAGNOSIS — E876 Hypokalemia: Secondary | ICD-10-CM | POA: Diagnosis not present

## 2016-06-10 DIAGNOSIS — Z801 Family history of malignant neoplasm of trachea, bronchus and lung: Secondary | ICD-10-CM | POA: Diagnosis not present

## 2016-06-10 DIAGNOSIS — E860 Dehydration: Secondary | ICD-10-CM | POA: Diagnosis present

## 2016-06-10 DIAGNOSIS — A4189 Other specified sepsis: Principal | ICD-10-CM | POA: Diagnosis present

## 2016-06-10 DIAGNOSIS — Z86010 Personal history of colonic polyps: Secondary | ICD-10-CM | POA: Diagnosis not present

## 2016-06-10 LAB — CSF CELL COUNT WITH DIFFERENTIAL
RBC Count, CSF: 0 /mm3
RBC Count, CSF: 1 /mm3 — ABNORMAL HIGH
Tube #: 1
Tube #: 4
WBC, CSF: 1 /mm3 (ref 0–5)
WBC, CSF: 3 /mm3 (ref 0–5)

## 2016-06-10 LAB — CBC WITH DIFFERENTIAL/PLATELET
BASOS ABS: 0 10*3/uL (ref 0.0–0.1)
BASOS PCT: 0 %
EOS ABS: 0 10*3/uL (ref 0.0–0.7)
EOS PCT: 0 %
HCT: 45 % (ref 39.0–52.0)
Hemoglobin: 15.8 g/dL (ref 13.0–17.0)
LYMPHS PCT: 7 %
Lymphs Abs: 0.6 10*3/uL — ABNORMAL LOW (ref 0.7–4.0)
MCH: 30.3 pg (ref 26.0–34.0)
MCHC: 35.1 g/dL (ref 30.0–36.0)
MCV: 86.4 fL (ref 78.0–100.0)
MONO ABS: 0.9 10*3/uL (ref 0.1–1.0)
Monocytes Relative: 10 %
Neutro Abs: 7.4 10*3/uL (ref 1.7–7.7)
Neutrophils Relative %: 83 %
PLATELETS: 159 10*3/uL (ref 150–400)
RBC: 5.21 MIL/uL (ref 4.22–5.81)
RDW: 12.9 % (ref 11.5–15.5)
WBC: 8.9 10*3/uL (ref 4.0–10.5)

## 2016-06-10 LAB — RESPIRATORY PANEL BY PCR

## 2016-06-10 LAB — GLUCOSE, CSF: Glucose, CSF: 84 mg/dL — ABNORMAL HIGH (ref 40–70)

## 2016-06-10 LAB — CRYPTOCOCCAL ANTIGEN, CSF: Crypto Ag: NEGATIVE

## 2016-06-10 LAB — URINALYSIS, ROUTINE W REFLEX MICROSCOPIC
BILIRUBIN URINE: NEGATIVE
Bacteria, UA: NONE SEEN
GLUCOSE, UA: 50 mg/dL — AB
Ketones, ur: 20 mg/dL — AB
LEUKOCYTES UA: NEGATIVE
Nitrite: NEGATIVE
PH: 5 (ref 5.0–8.0)
Protein, ur: 100 mg/dL — AB
SPECIFIC GRAVITY, URINE: 1.02 (ref 1.005–1.030)
SQUAMOUS EPITHELIAL / LPF: NONE SEEN

## 2016-06-10 LAB — COMPREHENSIVE METABOLIC PANEL
ALK PHOS: 74 U/L (ref 38–126)
ALT: 17 U/L (ref 17–63)
ANION GAP: 11 (ref 5–15)
AST: 24 U/L (ref 15–41)
Albumin: 3.5 g/dL (ref 3.5–5.0)
BUN: 11 mg/dL (ref 6–20)
CALCIUM: 8.7 mg/dL — AB (ref 8.9–10.3)
CO2: 23 mmol/L (ref 22–32)
CREATININE: 1.13 mg/dL (ref 0.61–1.24)
Chloride: 98 mmol/L — ABNORMAL LOW (ref 101–111)
GFR calc Af Amer: >60 mL/min (ref >60)
Glucose, Bld: 201 mg/dL — ABNORMAL HIGH (ref 65–99)
Potassium: 3.5 mmol/L (ref 3.5–5.1)
Sodium: 132 mmol/L — ABNORMAL LOW (ref 135–145)
TOTAL PROTEIN: 6.9 g/dL (ref 6.5–8.1)
Total Bilirubin: 1 mg/dL (ref 0.3–1.2)

## 2016-06-10 LAB — PROTEIN, CSF: Total  Protein, CSF: 52 mg/dL — ABNORMAL HIGH (ref 15–45)

## 2016-06-10 LAB — I-STAT CG4 LACTIC ACID, ED
LACTIC ACID, VENOUS: 2.06 mmol/L — AB (ref 0.5–1.9)
Lactic Acid, Venous: 1.06 mmol/L (ref 0.5–1.9)

## 2016-06-10 LAB — INFLUENZA PANEL BY PCR (TYPE A & B)
Influenza A By PCR: NEGATIVE
Influenza B By PCR: NEGATIVE

## 2016-06-10 MED ORDER — VANCOMYCIN HCL IN DEXTROSE 750-5 MG/150ML-% IV SOLN
750.0000 mg | Freq: Two times a day (BID) | INTRAVENOUS | Status: DC
  Filled 2016-06-10: qty 150

## 2016-06-10 MED ORDER — ACETAMINOPHEN 325 MG PO TABS
650.0000 mg | ORAL_TABLET | Freq: Once | ORAL | Status: AC
  Administered 2016-06-10: 650 mg via ORAL
  Filled 2016-06-10: qty 2

## 2016-06-10 MED ORDER — SODIUM CHLORIDE 0.9% FLUSH
3.0000 mL | Freq: Two times a day (BID) | INTRAVENOUS | Status: DC
  Administered 2016-06-11 – 2016-06-15 (×6): 3 mL via INTRAVENOUS

## 2016-06-10 MED ORDER — ACETAMINOPHEN 500 MG PO TABS
1000.0000 mg | ORAL_TABLET | Freq: Once | ORAL | Status: AC
  Administered 2016-06-10: 1000 mg via ORAL
  Filled 2016-06-10: qty 2

## 2016-06-10 MED ORDER — OMEGA-3 FATTY ACIDS 1000 MG PO CAPS: 1.0000 g | ORAL_CAPSULE | Freq: Every day | ORAL | Status: DC

## 2016-06-10 MED ORDER — HYDRALAZINE HCL 20 MG/ML IJ SOLN: 5.0000 mg | INTRAMUSCULAR | Status: DC | PRN

## 2016-06-10 MED ORDER — SIMVASTATIN 20 MG PO TABS
20.0000 mg | ORAL_TABLET | Freq: Every day | ORAL | Status: DC
  Administered 2016-06-11 – 2016-06-14 (×4): 20 mg via ORAL
  Filled 2016-06-10 (×4): qty 1

## 2016-06-10 MED ORDER — OMEGA-3-ACID ETHYL ESTERS 1 G PO CAPS
1.0000 g | ORAL_CAPSULE | Freq: Every day | ORAL | Status: DC
  Administered 2016-06-11 – 2016-06-15 (×5): 1 g via ORAL
  Filled 2016-06-10 (×6): qty 1

## 2016-06-10 MED ORDER — VANCOMYCIN HCL IN DEXTROSE 1-5 GM/200ML-% IV SOLN
1000.0000 mg | Freq: Once | INTRAVENOUS | Status: AC
  Administered 2016-06-11: 1000 mg via INTRAVENOUS
  Filled 2016-06-10: qty 200

## 2016-06-10 MED ORDER — KETOROLAC TROMETHAMINE 15 MG/ML IJ SOLN
15.0000 mg | Freq: Once | INTRAMUSCULAR | Status: AC
  Administered 2016-06-10: 15 mg via INTRAVENOUS
  Filled 2016-06-10: qty 1

## 2016-06-10 MED ORDER — DOXYCYCLINE HYCLATE 100 MG IV SOLR
100.0000 mg | Freq: Two times a day (BID) | INTRAVENOUS | Status: DC
  Administered 2016-06-11 – 2016-06-14 (×7): 100 mg via INTRAVENOUS
  Filled 2016-06-10 (×9): qty 100

## 2016-06-10 MED ORDER — INSULIN ASPART 100 UNIT/ML ~~LOC~~ SOLN
0.0000 [IU] | Freq: Every day | SUBCUTANEOUS | Status: DC
Start: 2016-06-10 — End: 2016-06-15

## 2016-06-10 MED ORDER — SODIUM CHLORIDE 0.9 % IV BOLUS (SEPSIS)
1000.0000 mL | Freq: Once | INTRAVENOUS | Status: AC
  Administered 2016-06-11: 1000 mL via INTRAVENOUS

## 2016-06-10 MED ORDER — DEXTROSE 5 % IV SOLN
700.0000 mg | Freq: Three times a day (TID) | INTRAVENOUS | Status: DC
  Administered 2016-06-11 – 2016-06-12 (×5): 700 mg via INTRAVENOUS
  Filled 2016-06-10 (×7): qty 14

## 2016-06-10 MED ORDER — ZOLPIDEM TARTRATE 5 MG PO TABS
5.0000 mg | ORAL_TABLET | Freq: Every evening | ORAL | Status: DC | PRN
  Administered 2016-06-11 – 2016-06-12 (×2): 5 mg via ORAL
  Filled 2016-06-10 (×2): qty 1

## 2016-06-10 MED ORDER — PIPERACILLIN-TAZOBACTAM 3.375 G IVPB 30 MIN
3.3750 g | Freq: Once | INTRAVENOUS | Status: AC
  Administered 2016-06-11: 3.375 g via INTRAVENOUS
  Filled 2016-06-10: qty 50

## 2016-06-10 MED ORDER — ONDANSETRON HCL 4 MG/2ML IJ SOLN: 4.0000 mg | Freq: Three times a day (TID) | INTRAMUSCULAR | Status: DC | PRN

## 2016-06-10 MED ORDER — ACETAMINOPHEN 325 MG PO TABS
650.0000 mg | ORAL_TABLET | Freq: Four times a day (QID) | ORAL | Status: DC | PRN
  Administered 2016-06-11 – 2016-06-14 (×11): 650 mg via ORAL
  Filled 2016-06-10 (×11): qty 2

## 2016-06-10 MED ORDER — SODIUM CHLORIDE 0.9 % IV SOLN
INTRAVENOUS | Status: DC
Start: 2016-06-10 — End: 2016-06-11
  Administered 2016-06-11: 02:00:00 via INTRAVENOUS

## 2016-06-10 MED ORDER — ASPIRIN 81 MG PO CHEW
81.0000 mg | CHEWABLE_TABLET | Freq: Every day | ORAL | Status: DC
  Administered 2016-06-11 – 2016-06-15 (×5): 81 mg via ORAL
  Filled 2016-06-10 (×5): qty 1

## 2016-06-10 MED ORDER — PIPERACILLIN-TAZOBACTAM 3.375 G IVPB
3.3750 g | Freq: Three times a day (TID) | INTRAVENOUS | Status: DC
  Filled 2016-06-10: qty 50

## 2016-06-10 MED ORDER — PANTOPRAZOLE SODIUM 40 MG PO TBEC
40.0000 mg | DELAYED_RELEASE_TABLET | Freq: Every day | ORAL | Status: DC
  Administered 2016-06-11 – 2016-06-15 (×5): 40 mg via ORAL
  Filled 2016-06-10 (×3): qty 1
  Filled 2016-06-10: qty 2
  Filled 2016-06-10: qty 1
  Filled 2016-06-10: qty 2

## 2016-06-10 MED ORDER — DOXYCYCLINE HYCLATE 100 MG PO TABS: 100.0000 mg | ORAL_TABLET | Freq: Once | ORAL | Status: DC

## 2016-06-10 MED ORDER — LIDOCAINE HCL 2 % IJ SOLN
20.0000 mL | Freq: Once | INTRAMUSCULAR | Status: AC
  Administered 2016-06-10: 400 mg
  Filled 2016-06-10: qty 20

## 2016-06-10 MED ORDER — SODIUM CHLORIDE 0.9 % IV BOLUS (SEPSIS)
2000.0000 mL | Freq: Once | INTRAVENOUS | Status: AC
  Administered 2016-06-10: 2000 mL via INTRAVENOUS

## 2016-06-10 MED ORDER — INSULIN ASPART 100 UNIT/ML ~~LOC~~ SOLN
0.0000 [IU] | Freq: Three times a day (TID) | SUBCUTANEOUS | Status: DC
  Administered 2016-06-11 – 2016-06-12 (×3): 2 [IU] via SUBCUTANEOUS
  Administered 2016-06-12 (×2): 1 [IU] via SUBCUTANEOUS
  Administered 2016-06-13: 2 [IU] via SUBCUTANEOUS
  Administered 2016-06-14: 1 [IU] via SUBCUTANEOUS
  Administered 2016-06-14: 2 [IU] via SUBCUTANEOUS
  Administered 2016-06-14: 3 [IU] via SUBCUTANEOUS
  Administered 2016-06-15: 1 [IU] via SUBCUTANEOUS
  Administered 2016-06-15: 2 [IU] via SUBCUTANEOUS

## 2016-06-10 NOTE — ED Notes (Signed)
Patient transported to CT 

## 2016-06-10 NOTE — ED Triage Notes (Addendum)
Pt to ER for evaluation of fever, measured at home up to 104, at present 103.1. Denies cough, but does report nausea, vomiting and diarrhea onset two days ago along with fever. 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 Hx of diverticulitis.

## 2016-06-10 NOTE — Progress Notes (Signed)
Pharmacy Antibiotic Note  Mark Bowen is a 81 y.o. male admitted on 06/10/2016 with sepsis with unclear etiology and HA.  Pharmacy has been consulted for Vancocin, Zosyn, and acyclovir dosing.  Plan: Vancomycin 1000mg  x1 then 750mg  IV every 12 hours.  Goal trough 15-20 mcg/mL. Zosyn 3.375g IV every 8 hours (4-hour infusion).  Acyclovir 700mg  IV every 8 hours.  Height: 5\' 9"  (175.3 cm) Weight: 172 lb (78 kg) IBW/kg (Calculated) : 70.7  Temp (24hrs), Avg:101.9 F (38.8 C), Min:100.4 F (38 C), Max:103.1 F (39.5 C)   Recent Labs Lab 06/10/16 1451 06/10/16 1511 06/10/16 1743  WBC 8.9  --   --   CREATININE 1.13  --   --   LATICACIDVEN  --  2.06* 1.06    Estimated Creatinine Clearance: 53.9 mL/min (by C-G formula based on SCr of 1.13 mg/dL).    Allergies  Allergen Reactions  . Iodine   . Iohexol      Code: HIVES, Desc: pt. states he breaks out in hives with iv dye      Thank you for allowing pharmacy to be a part of this patient's care.  Wynona Neat, PharmD, BCPS  06/10/2016 11:24 PM

## 2016-06-10 NOTE — ED Notes (Signed)
ED Provider at bedside. 

## 2016-06-10 NOTE — H&P (Signed)
History and Physical    Mark Bowen XNA:[id number] DOB: Sep 22, 1935 DOA: 06/10/2016  Referring MD/NP/PA:   PCP: [name], MD   Patient coming from:  The patient is coming from home.  At baseline, pt is independent for most of ADL.   Chief Complaint: fever, HA, nausea, vomiting  HPI: Mark Bowen is a 81 y.o. male with medical history significant of hypertension, hyperlipidemia, diabetes mellitus, GERD, prostate cancer (surgery, no radiation and chemotherapy), diverticulitis, who presents with fever, nausea, vomiting, HA.  Patient states that he has been feeling ill for 4 day. He has difficulty sleeping and body aches. Today he developed chill and fever with temperature 104 at home. He has bad headache on the top of his head, which is constant, nonradiating. Denies neck pain or neck rigidity. Patient does not have cough, shortness breath, chest pain. He states that he has chronic mild loose stool bowel movement, which has not changed. He has nausea and vomited twice today. No hematochezia or hematemesis. Denies symptoms of UTI or hematuria. No unilateral weakness. Patient notes that he has had tick bites most recently approximately 3 weeks ago. He also notes numerous mosquito bites. He denies any rashes. 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  ED Course: pt was found to have WBC 8.9, lactate of 2.06, 1.06, negative urinalysis, creatinine 1.13, temperature 103.1, tachycardia, tachypnea, oxygen satting 95% on room air, negative CT he had a full acute intracranial abnormalities, negative chest x-ray. Lumbar puncture was performed by EDP. Initial SCF results showed WBC 3, 1 and RBC 1, 0, protein 52, and glucose 84. Flu pcr negative. Pt is admitted to telemetry bed as inpatient.  Review of Systems:   General: has fevers, chills, no changes in body weight, has poor appetite, has fatigue and HA HEENT: no blurry vision, hearing changes or sore throat Respiratory: no  dyspnea, coughing, wheezing CV: no chest pain, no palpitations GI: has nausea, vomiting, abdominal pain. Has loose stool. GU: no dysuria, burning on urination, increased urinary frequency, hematuria  Ext: no leg edema Neuro: no unilateral weakness, numbness, or tingling, no vision change or hearing loss Skin: no rash, no skin tear. MSK: No muscle spasm, no deformity, no limitation of range of movement in spin Heme: No easy bruising.  Travel history: No recent long distant travel.  Allergy:  Allergies  Allergen Reactions  . Iodine   . Iohexol      Code: HIVES, Desc: pt. states he breaks out in hives with iv dye     Past Medical History:  Diagnosis Date  . Arthritis   . Diabetes mellitus 2007   type 2  . Diverticulitis   . GERD (gastroesophageal reflux disease)   . Hypercholesterolemia since 2008 preventative   taking Zocor, now cholesterol WNL  . Hypertension since 1970  . Prostate CA ([place])   . Shingles     Past Surgical History:  Procedure Laterality Date  . APPENDECTOMY  1960  . [place] & 2009  . INGUINAL HERNIA REPAIR  1970   bilateral  . PROSTATE SURGERY    . TONSILLECTOMY  1960    Social History:  reports that he quit smoking about 57 years ago. He has never used smokeless tobacco. He reports that he does not drink alcohol or use drugs.  Family History:  Family History  Problem Relation Age of Onset  . Lung cancer Father   . Prostate cancer Brother  x 3 brothers  . Colon cancer Neg Hx   . Stomach cancer Neg Hx      Prior to Admission medications   Medication Sig Start Date End Date Taking? Authorizing Provider  aspirin 81 MG tablet Take 81 mg by mouth daily.   Yes [provider]  benazepril (LOTENSIN) 20 MG tablet Take 20 mg by mouth daily.   Yes [provider]  fish oil-omega-3 fatty acids 1000 MG capsule Take 1 g by mouth daily.    Yes [provider]  omeprazole (PRILOSEC) 20 MG capsule Take 20 mg by  mouth daily.   Yes [provider]  simvastatin (ZOCOR) 40 MG tablet Take 20 mg by mouth every morning.   Yes [provider]  sitaGLIPtan-metformin (JANUMET) 50-1000 MG per tablet Take 1 tablet by mouth 2 (two) times daily with a meal.   Yes [provider]    Physical Exam: Vitals:   06/10/16 2300 06/10/16 2348 06/11/16 0000 06/11/16 0039  BP: 110/66  120/72 (!) 122/97  Pulse: (!) 59   (!) 57  Resp: 16  17 18   Temp:  98.6 F (37 C)  97.9 F (36.6 C)  TempSrc:  Oral  Oral  SpO2: 95%   99%  Weight:      Height:       General: Not in acute distress HEENT:       Eyes: PERRL, EOMI, no scleral icterus.       ENT: No discharge from the ears and nose, no pharynx injection, no tonsillar enlargement.        Neck: No JVD, no bruit, no mass felt. Heme: No neck lymph node enlargement. Cardiac: S1/S2, RRR, No murmurs, No gallops or rubs. Respiratory: No rales, wheezing, rhonchi or rubs. GI: Soft, nondistended, nontender, no rebound pain, no organomegaly, BS present. GU: No hematuria Ext: No pitting leg edema bilaterally. 2+DP/PT pulse bilaterally. Musculoskeletal: No joint deformities, No joint redness or warmth, no limitation of ROM in spin. Skin: No rashes.  Neuro: Alert, oriented X3, cranial nerves II-XII grossly intact, moves all extremities normally. Neck is supple. Psych: Patient is not psychotic, no suicidal or hemocidal ideation.  Labs on Admission: I have personally reviewed following labs and imaging studies  CBC:  Recent Labs Lab 06/10/16 1451  WBC 8.9  NEUTROABS 7.4  HGB 15.8  HCT 45.0  MCV 86.4  PLT 638   Basic Metabolic Panel:  Recent Labs Lab 06/10/16 1451  NA 132*  K 3.5  CL 98*  CO2 23  GLUCOSE 201*  BUN 11  CREATININE 1.13  CALCIUM 8.7*   GFR: Estimated Creatinine Clearance: 53.9 mL/min (by C-G formula based on SCr of 1.13 mg/dL). Liver Function Tests:  Recent Labs Lab 06/10/16 1451  AST 24  ALT 17  ALKPHOS 74    BILITOT 1.0  PROT 6.9  ALBUMIN 3.5   No results for input(s): LIPASE, AMYLASE in the last 168 hours. No results for input(s): AMMONIA in the last 168 hours. Coagulation Profile: No results for input(s): INR, PROTIME in the last 168 hours. Cardiac Enzymes: No results for input(s): CKTOTAL, CKMB, CKMBINDEX, TROPONINI in the last 168 hours. BNP (last 3 results) No results for input(s): PROBNP in the last 8760 hours. HbA1C: No results for input(s): HGBA1C in the last 72 hours. CBG: No results for input(s): GLUCAP in the last 168 hours. Lipid Profile: No results for input(s): CHOL, HDL, LDLCALC, TRIG, CHOLHDL, LDLDIRECT in the last 72 hours. Thyroid Function Tests:  No results for input(s): TSH, T4TOTAL, FREET4, T3FREE, THYROIDAB in the last 72 hours. Anemia Panel: No results for input(s): VITAMINB12, FOLATE, FERRITIN, TIBC, IRON, RETICCTPCT in the last 72 hours. Urine analysis:    Component Value Date/Time   COLORURINE YELLOW 06/10/2016 1538   APPEARANCEUR CLEAR 06/10/2016 1538   LABSPEC 1.020 06/10/2016 1538   PHURINE 5.0 06/10/2016 1538   GLUCOSEU 50 (A) 06/10/2016 1538   HGBUR MODERATE (A) 06/10/2016 1538   BILIRUBINUR NEGATIVE 06/10/2016 1538   KETONESUR 20 (A) 06/10/2016 1538   PROTEINUR 100 (A) 06/10/2016 1538   UROBILINOGEN 0.2 07/03/2006 0915   NITRITE NEGATIVE 06/10/2016 1538   LEUKOCYTESUR NEGATIVE 06/10/2016 1538   Sepsis Labs: @LABRCNTIP (procalcitonin:4,lacticidven:4) ) Recent Results (from the past 240 hour(s))  Respiratory Panel by PCR     Status: None   Collection Time: 06/10/16  5:11 PM  Result Value Ref Range Status   Adenovirus NOT DETECTED NOT DETECTED Final   Coronavirus 229E NOT DETECTED NOT DETECTED Final   Coronavirus HKU1 NOT DETECTED NOT DETECTED Final   Coronavirus NL63 NOT DETECTED NOT DETECTED Final   Coronavirus OC43 NOT DETECTED NOT DETECTED Final   Metapneumovirus NOT DETECTED NOT DETECTED Final   Rhinovirus / Enterovirus NOT DETECTED NOT  DETECTED Final   Influenza A NOT DETECTED NOT DETECTED Final   Influenza B NOT DETECTED NOT DETECTED Final   Parainfluenza Virus 1 NOT DETECTED NOT DETECTED Final   Parainfluenza Virus 2 NOT DETECTED NOT DETECTED Final   Parainfluenza Virus 3 NOT DETECTED NOT DETECTED Final   Parainfluenza Virus 4 NOT DETECTED NOT DETECTED Final   Respiratory Syncytial Virus NOT DETECTED NOT DETECTED Final   Bordetella pertussis NOT DETECTED NOT DETECTED Final   Chlamydophila pneumoniae NOT DETECTED NOT DETECTED Final   Mycoplasma pneumoniae NOT DETECTED NOT DETECTED Final  CSF culture     Status: None (Preliminary result)   Collection Time: 06/10/16  9:24 PM  Result Value Ref Range Status   Specimen Description CSF  Final   Special Requests NO 2  Final   Gram Stain   Final    CYTOSPIN SMEAR WBC PRESENT, PREDOMINANTLY MONONUCLEAR NO ORGANISMS SEEN    Culture PENDING  Incomplete   Report Status PENDING  Incomplete     Radiological Exams on Admission: Dg Chest 2 View  Result Date: 06/10/2016 CLINICAL DATA:  Fever EXAM: CHEST  2 VIEW COMPARISON:  07/08/2006 FINDINGS: Low lung volumes. Heart size is accentuated by the AP nature of the study and the low lung volumes, felt to be within normal limits. No confluent opacities or effusions. No acute bony abnormality. IMPRESSION: No active cardiopulmonary disease. Electronically Signed   By: Rolm Baptise M.D.   On: 06/10/2016 18:41   Ct Head Wo Contrast  Result Date: 06/10/2016 CLINICAL DATA:  Headaches EXAM: CT HEAD WITHOUT CONTRAST TECHNIQUE: Contiguous axial images were obtained from the base of the skull through the vertex without intravenous contrast. COMPARISON:  None. FINDINGS: Brain: Diffuse atrophic changes are noted. No findings to suggest acute hemorrhage, acute infarction or space-occupying mass lesion are noted. Vascular: No hyperdense vessel or unexpected calcification. Skull: Normal. Negative for fracture or focal lesion. Sinuses/Orbits: No acute  finding. Other: None IMPRESSION: Diffuse atrophic changes.  No acute abnormality is noted. Electronically Signed   By: Inez Catalina M.D.   On: 06/10/2016 20:31     EKG:  Reviewed independently, sinus rhythm, QTC 419, low voltage, nonspecific T-wave change.  Assessment/Plan Principal Problem:   Sepsis ([place]) Active Problems:  Essential hypertension   GERD   HLD (hyperlipidemia)   Diabetes mellitus without complication ([place])   Sepsis [facility]): Patient is septic with elevated lactate, fever, tachycardia and tachypnea. No leukocytosis. Currently hemodynamically stable. Etiology is not clear. Differential diagnosis is broad, including meningitis given headache, tick born diease and viral or fungal infection given working with pigeon. LP was performed in ED, initial CSF analysis is not consistent with bacterial meningitis, cannot rule out viral meningitis completely. Not sure if pt may have other source of infectiion.   -will admit to tele bed as inpt -start doxycline IV -start IV vanco and zosyn -IV acyclovir until HSV prn negative -f/u CSF analysis, lyme and RMSF titers, Bx and Ux -will get Procalcitonin and trend lactic acid levels per sepsis protocol. -IVF: 3L of NS bolus in ED, followed by 100 cc/h   HTN: -Hold Benezepril since pt is at risk of developing hypertension -IV hydralazine when necessary  GERD: -Protonix  HLD: -zocor  DM-II: Last A1c nor on record. Patient is taking Janumet at home -SSI -Check A1c  DVT ppx: SCD Code Status: Full code Family Communication:  Yes, patient's son-in law at bed side Disposition Plan:  Anticipate discharge back to previous home environment Consults called:  none Admission status:   Inpatient/tele   Date of Service 06/11/2016    Ivor Costa [facility] Pager [phone]  If 7PM-7AM, please contact night-coverage [email or web address] Password [email or web address] 06/11/2016, 1:00 AM

## 2016-06-10 NOTE — ED Provider Notes (Signed)
[facility] DEPT Provider Note   CSN: [id number] Arrival date & time: 06/10/16  1430   History   Chief Complaint Chief Complaint  Patient presents with  . Fever    HPI Mark Bowen is a 81 y.o. male.  HPI   81 year old male presents today with complaints of fever. Patient notes that 4 days ago he started feeling ill. He had difficulty sleeping 3 nights ago with body aches and fever. Daughter notes that she measured temperature of 101 yesterday, 104 today. Patient reports he's had a generalized headache, nonsevere with no associated neck stiffness. He denies any upper respiratory congestion, cough, abdominal pain. Patient notes he normally has loose stools, this is unchanged. He denies any abdominal pain, urinary changes. He denies any rashes. Patient notes that he has had tick bites most recently approximately 2 weeks ago. He also notes numerous mosquito bites. Patient reports that one of his coworkers was sick with similar symptoms several weeks ago. 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  Daughter notes some word finding yesterday.    Past Medical History:  Diagnosis Date  . Arthritis   . Diabetes mellitus 2007   type 2  . Diverticulitis   . GERD (gastroesophageal reflux disease)   . Hypercholesterolemia since 2008 preventative   taking Zocor, now cholesterol WNL  . Hypertension since 1970  . Prostate CA ([place])   . Shingles     Patient Active Problem List   Diagnosis Date Noted  . DIVERTICULITIS-COLON 01/08/2008  . CHOLELITHIASIS 01/08/2008  . PERSONAL HX COLONIC POLYPS 01/08/2008  . DIABETES MELLITUS, TYPE I, CONTROLLED 12/31/2007  . HYPERTENSION 12/31/2007  . GERD 12/31/2007  . ARTHRITIS 12/31/2007  . HEMORRHOIDS, INTERNAL 05/02/2006  . DIVERTICULOSIS, COLON 05/02/2006    Past Surgical History:  Procedure Laterality Date  . APPENDECTOMY  1960  . [place] & 2009  . INGUINAL HERNIA REPAIR  1970   bilateral  . PROSTATE  SURGERY    . TONSILLECTOMY  1960     Home Medications    Prior to Admission medications   Medication Sig Start Date End Date Taking? Authorizing Provider  aspirin 81 MG tablet Take 81 mg by mouth daily.    [provider]  benazepril (LOTENSIN) 20 MG tablet Take 20 mg by mouth daily.    [provider]  fish oil-omega-3 fatty acids 1000 MG capsule Take 1 g by mouth daily.     [provider]  omeprazole (PRILOSEC) 20 MG capsule Take 20 mg by mouth daily.    [provider]  simvastatin (ZOCOR) 40 MG tablet Take 20 mg by mouth every morning.    [provider]  sitaGLIPtan-metformin (JANUMET) 50-1000 MG per tablet Take 1 tablet by mouth 2 (two) times daily with a meal.    [provider]    Family History Family History  Problem Relation Age of Onset  . Lung cancer Father   . Prostate cancer Brother        x 3 brothers  . Colon cancer Neg Hx   . Stomach cancer Neg Hx     Social History Social History  Substance Use Topics  . Smoking status: Former Smoker    Quit date: 04/04/1959  . Smokeless tobacco: Never Used  . Alcohol use No     Allergies   Iodine and Iohexol   Review of Systems Review of Systems  All other systems reviewed and are negative.   Physical Exam Updated  Vital Signs BP (!) 144/81   Pulse 82   Temp (!) 100.4 F (38 C) (Oral)   Resp (!) 22   Ht 5\' 9"  (1.753 m)   Wt 78 kg (172 lb)   SpO2 97%   BMI 25.40 kg/m   Physical Exam  Constitutional: He is oriented to person, place, and time. He appears well-developed and well-nourished.  HENT:  Head: Normocephalic and atraumatic.  Eyes: Conjunctivae are normal. Pupils are equal, round, and reactive to light. Right eye exhibits no discharge. Left eye exhibits no discharge. No scleral icterus.  Neck: Normal range of motion. Neck supple. No JVD present. No tracheal deviation present.  Neck supple FOM, NTTP  Cardiovascular: Normal rate, regular  rhythm, normal heart sounds and intact distal pulses.  Exam reveals no gallop and no friction rub.   No murmur heard. Pulmonary/Chest: Effort normal and breath sounds normal. No stridor. No respiratory distress. He has no wheezes. He has no rales. He exhibits no tenderness.  Musculoskeletal: Normal range of motion.  Neurological: He is alert and oriented to person, place, and time. Coordination normal.  Skin: Skin is warm. No rash noted. No erythema.  Psychiatric: He has a normal mood and affect. His behavior is normal. Judgment and thought content normal.  Nursing note and vitals reviewed.   ED Treatments / Results  Labs (all labs ordered are listed, but only abnormal results are displayed) Labs Reviewed  COMPREHENSIVE METABOLIC PANEL - Abnormal; Notable for the following:       Result Value   Sodium 132 (*)    Chloride 98 (*)    Glucose, Bld 201 (*)    Calcium 8.7 (*)    All other components within normal limits  CBC WITH DIFFERENTIAL/PLATELET - Abnormal; Notable for the following:    Lymphs Abs 0.6 (*)    All other components within normal limits  URINALYSIS, ROUTINE W REFLEX MICROSCOPIC - Abnormal; Notable for the following:    Glucose, UA 50 (*)    Hgb urine dipstick MODERATE (*)    Ketones, ur 20 (*)    Protein, ur 100 (*)    All other components within normal limits  I-STAT CG4 LACTIC ACID, ED - Abnormal; Notable for the following:    Lactic Acid, Venous 2.06 (*)    All other components within normal limits  RESPIRATORY PANEL BY PCR  CULTURE, BLOOD (ROUTINE X 2)  CULTURE, BLOOD (ROUTINE X 2)  CSF CULTURE  GRAM STAIN  HSV CULTURE AND TYPING  INFLUENZA PANEL BY PCR (TYPE A & B)  ROCKY MTN SPOTTED FVR ABS PNL(IGG+IGM)  B. BURGDORFI ANTIBODIES  CSF CELL COUNT WITH DIFFERENTIAL  CSF CELL COUNT WITH DIFFERENTIAL  GLUCOSE, CSF  PROTEIN, CSF  HERPES SIMPLEX VIRUS(HSV) DNA BY PCR  CRYPTOCOCCAL ANTIGEN, CSF  ARBOVIRUS IGG, CSF  VDRL, CSF  I-STAT CG4 LACTIC ACID, ED     EKG  EKG Interpretation None       Radiology Dg Chest 2 View  Result Date: 06/10/2016 CLINICAL DATA:  Fever EXAM: CHEST  2 VIEW COMPARISON:  07/08/2006 FINDINGS: Low lung volumes. Heart size is accentuated by the AP nature of the study and the low lung volumes, felt to be within normal limits. No confluent opacities or effusions. No acute bony abnormality. IMPRESSION: No active cardiopulmonary disease. Electronically Signed   By: Rolm Baptise M.D.   On: 06/10/2016 18:41   Ct Head Wo Contrast  Result Date: 06/10/2016 CLINICAL DATA:  Headaches EXAM: CT HEAD WITHOUT CONTRAST  TECHNIQUE: Contiguous axial images were obtained from the base of the skull through the vertex without intravenous contrast. COMPARISON:  None. FINDINGS: Brain: Diffuse atrophic changes are noted. No findings to suggest acute hemorrhage, acute infarction or space-occupying mass lesion are noted. Vascular: No hyperdense vessel or unexpected calcification. Skull: Normal. Negative for fracture or focal lesion. Sinuses/Orbits: No acute finding. Other: None IMPRESSION: Diffuse atrophic changes.  No acute abnormality is noted. Electronically Signed   By: Inez Catalina M.D.   On: 06/10/2016 20:31    Procedures .Lumbar Puncture Date/Time: 06/10/2016 9:20 PM Performed by: Penni Bombard, Makena Murdock Authorized by: Penni Bombard, Vonnie Ligman   Consent:    Consent obtained:  Verbal and emergent situation   Consent given by:  Patient   Risks discussed:  Bleeding, headache, infection, nerve damage, pain and repeat procedure   Alternatives discussed:  No treatment and observation Universal protocol:    Procedure explained and questions answered to patient or proxy's satisfaction: yes     Relevant documents present and verified: yes     Test results available and properly labeled: yes     Imaging studies available: yes     Required blood products, implants, devices, and special equipment available: yes     Immediately prior to procedure a time out was  called: yes     Site/side marked: yes     Patient identity confirmed:  Verbally with patient and hospital-assigned identification number Pre-procedure details:    Procedure purpose:  Diagnostic   Preparation: Patient was prepped and draped in usual sterile fashion   Anesthesia (see MAR for exact dosages):    Anesthesia method:  Local infiltration   Local anesthetic:  Lidocaine 2% w/o epi Procedure details:    Lumbar space:  L3-L4 interspace   Patient position:  L lateral decubitus   Needle gauge:  20   Needle type:  Spinal needle - Quincke tip   Needle length (in):  3.5   Ultrasound guidance: no     Number of attempts:  1   Fluid appearance:  Clear   Tubes of fluid:  4   Total volume (ml):  8 Post-procedure:    Puncture site:  Direct pressure applied and adhesive bandage applied   Patient tolerance of procedure:  Tolerated well, no immediate complications   (including critical care time)  Medications Ordered in ED Medications  lidocaine (XYLOCAINE) 2 % (with pres) injection 400 mg (not administered)  ketorolac (TORADOL) 15 MG/ML injection 15 mg (not administered)  sodium chloride 0.9 % bolus 2,000 mL (0 mLs Intravenous Stopped 06/10/16 1758)  acetaminophen (TYLENOL) tablet 650 mg (650 mg Oral Given 06/10/16 1627)     Initial Impression / Assessment and Plan / ED Course  I have reviewed the triage vital signs and the nursing notes.  Pertinent labs & imaging results that were available during my care of the patient were reviewed by me and considered in my medical decision making (see chart for details).     Final Clinical Impressions(s) / ED Diagnoses   Final diagnoses:  Fever, unspecified fever cause    Labs:  Lactic acid, recommend spotted fever, [place] E antibodies, influenza panel, respiratory panel, blood culture, urinalysis , CMP, CBC  Imaging: DG chest 2 view, CT head without   Consults:  Therapeutics: Tylenol  Discharge Meds:   Assessment/Plan:  75 YOM  presents today with fever. Uncertain etiology at this time. Pt is having headache without meningeal signs. He has no other acute findings as to origin of fever.  Pt will require LP in the ED with likely hospital admission for ongoing evaluation.   LP successfully - labs sent to lab   New Prescriptions New Prescriptions   No medications on file     Francee Gentile 06/10/16 2122    Carmin Muskrat, MD 06/10/16 2356

## 2016-06-10 NOTE — ED Notes (Signed)
Lactic acid 2.06

## 2016-06-11 DIAGNOSIS — Z87891 Personal history of nicotine dependence: Secondary | ICD-10-CM

## 2016-06-11 DIAGNOSIS — W57XXXA Bitten or stung by nonvenomous insect and other nonvenomous arthropods, initial encounter: Secondary | ICD-10-CM

## 2016-06-11 DIAGNOSIS — Z888 Allergy status to other drugs, medicaments and biological substances status: Secondary | ICD-10-CM

## 2016-06-11 DIAGNOSIS — R51 Headache: Secondary | ICD-10-CM

## 2016-06-11 DIAGNOSIS — Z91041 Radiographic dye allergy status: Secondary | ICD-10-CM

## 2016-06-11 DIAGNOSIS — D696 Thrombocytopenia, unspecified: Secondary | ICD-10-CM

## 2016-06-11 DIAGNOSIS — M791 Myalgia: Secondary | ICD-10-CM

## 2016-06-11 DIAGNOSIS — Z8042 Family history of malignant neoplasm of prostate: Secondary | ICD-10-CM

## 2016-06-11 DIAGNOSIS — R410 Disorientation, unspecified: Secondary | ICD-10-CM

## 2016-06-11 DIAGNOSIS — Z801 Family history of malignant neoplasm of trachea, bronchus and lung: Secondary | ICD-10-CM

## 2016-06-11 DIAGNOSIS — Z779 Other contact with and (suspected) exposures hazardous to health: Secondary | ICD-10-CM

## 2016-06-11 DIAGNOSIS — E871 Hypo-osmolality and hyponatremia: Secondary | ICD-10-CM

## 2016-06-11 LAB — LACTIC ACID, PLASMA
Lactic Acid, Venous: 1 mmol/L (ref 0.5–1.9)
Lactic Acid, Venous: 1.1 mmol/L (ref 0.5–1.9)

## 2016-06-11 LAB — BASIC METABOLIC PANEL
ANION GAP: 8 (ref 5–15)
BUN: 11 mg/dL (ref 6–20)
CO2: 23 mmol/L (ref 22–32)
Calcium: 7.8 mg/dL — ABNORMAL LOW (ref 8.9–10.3)
Chloride: 101 mmol/L (ref 101–111)
Creatinine, Ser: 1.02 mg/dL (ref 0.61–1.24)
GFR calc Af Amer: >60 mL/min (ref >60)
GFR calc non Af Amer: >60 mL/min (ref >60)
GLUCOSE: 156 mg/dL — AB (ref 65–99)
POTASSIUM: 3.6 mmol/L (ref 3.5–5.1)
Sodium: 132 mmol/L — ABNORMAL LOW (ref 135–145)

## 2016-06-11 LAB — CBC
HEMATOCRIT: 37.4 % — AB (ref 39.0–52.0)
HEMOGLOBIN: 12.8 g/dL — AB (ref 13.0–17.0)
MCH: 29.8 pg (ref 26.0–34.0)
MCHC: 34.2 g/dL (ref 30.0–36.0)
MCV: 87 fL (ref 78.0–100.0)
Platelets: 137 10*3/uL — ABNORMAL LOW (ref 150–400)
RBC: 4.3 MIL/uL (ref 4.22–5.81)
RDW: 13 % (ref 11.5–15.5)
WBC: 6.8 10*3/uL (ref 4.0–10.5)

## 2016-06-11 LAB — GLUCOSE, CAPILLARY
GLUCOSE-CAPILLARY: 163 mg/dL — AB (ref 65–99)
GLUCOSE-CAPILLARY: 168 mg/dL — AB (ref 65–99)
GLUCOSE-CAPILLARY: 137 mg/dL — AB (ref 65–99)
Glucose-Capillary: 146 mg/dL — ABNORMAL HIGH (ref 65–99)

## 2016-06-11 LAB — PROCALCITONIN: PROCALCITONIN: 0.39 ng/mL

## 2016-06-11 LAB — PROTIME-INR
INR: 1.18
Prothrombin Time: 15.1 seconds (ref 11.4–15.2)

## 2016-06-11 MED ORDER — IBUPROFEN 600 MG PO TABS
600.0000 mg | ORAL_TABLET | Freq: Four times a day (QID) | ORAL | Status: AC | PRN
  Administered 2016-06-11 – 2016-06-13 (×4): 600 mg via ORAL
  Filled 2016-06-11 (×4): qty 1

## 2016-06-11 MED ORDER — SODIUM CHLORIDE 0.9 % IV SOLN
INTRAVENOUS | Status: AC
  Administered 2016-06-11 – 2016-06-12 (×2): via INTRAVENOUS

## 2016-06-11 MED ORDER — PIPERACILLIN-TAZOBACTAM 3.375 G IVPB
3.3750 g | Freq: Three times a day (TID) | INTRAVENOUS | Status: DC
  Filled 2016-06-11: qty 50

## 2016-06-11 NOTE — Consult Note (Signed)
[facility] for Infectious Disease  Total days of antibiotics 2        Day Day 2 Vancomycin, Zosyn, Acyclovir, Doxycycline         Admission Date: 06/10/2016      Reason for Consult: Fevers    Referring Physician: Algis Liming  Principal Problem:   Sepsis [facility]) Active Problems:   Essential hypertension   GERD   HLD (hyperlipidemia)   Diabetes mellitus without complication ([place])    HPI: Mark Bowen is a 81 y.o. male that was admitted to the hospital with fever, nausea, vomiting and headaches. Symptoms started Friday night with myalgias/arthralgias and feeling "wore out" with what are normal activities for him. Saturday morning he felt mild improvement after Tylenol. Saturday evening/night he again felt terrible with same symptoms, but also had facial flushing/chills and confusion per his daughter. He does not recall a lot of the conversations from Saturday night. Sunday morning he ate a light breakfast and had 2 small episodes of emesis w/o nausea. Presented to the hospital when temp was found to be 104 deg. He endorse a history of tick and mosquito bites with a known bite approx 3 weeks prior. He also picked a small tick off his left inner thigh last night and has a small red spot today. No ticks he recalls picking off him have ever been embedded or large. He sprays often and feels he is careful to prevent. 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 Lives in [place] since the 1970s. No travel to any other parts of the [place] or out of the country recently. He has a house in [place] that he is looking forward to visit soon. Has not been around any sick contacts recently.   In the ED he was found to have elevated lactate of 2.06, Temp 103.1deg, tachycardia, tachypnea with adequate oxygen saturation on RA. Head CT revealed no acute process and chest XRay unrevealing. LP was obtained  WBC 3 >> 1, RBC 1 >> 0, Protein 52 and glucose 84. Started empirically on Acyclovir, Doxycycline, Vancomycin and Zosyn. He was slightly hyponatremic (132) with normal LFTs. No thrombocytopenia or leukocytosis. Creatinine normal.   Overnight he has had ongoing fevers (101.2 - 102.3) responsive to Tylenol. He is normotensive with BP 130s/70s, tachycardia and lactic acidosis has resolved with IVF. He is overall still with same symptoms today. Headache is described to be over his whole scalp and is constantly achy, worsened with palpation of scalp. No chest pain, sore throat, lymphadenopathy, cough, sputum production, shortness of breath or GI/GU complaints/changes.   Past Medical History:  Diagnosis Date  . Arthritis   . Diabetes mellitus 2007   type 2  . Diverticulitis   . GERD (gastroesophageal reflux disease)   . Hypercholesterolemia since 2008 preventative   taking Zocor, now cholesterol WNL  . Hypertension since 1970  . Prostate CA ([place])   . Shingles     Allergies:  Allergies  Allergen Reactions  . Iodine   . Iohexol      Code: HIVES, Desc: pt. states he breaks out in hives with iv dye     Current antibiotics:   MEDICATIONS: . aspirin  81 mg Oral Daily  . insulin aspart  0-5 Units Subcutaneous QHS  . insulin aspart  0-9 Units Subcutaneous TID WC  . omega-3 acid ethyl esters  1  g Oral Daily  . pantoprazole  40 mg Oral Daily  . simvastatin  20 mg Oral q1800  . sodium chloride flush  3 mL Intravenous Q12H    Social History  Substance Use Topics  . Smoking status: Former Smoker    Quit date: 04/04/1959  . Smokeless tobacco: Never Used  . Alcohol use No    Family History  Problem Relation Age of Onset  . Lung cancer Father   . Prostate cancer Brother        x 3 brothers  . Colon cancer Neg Hx   . Stomach cancer Neg Hx     Review of Systems - Negative except for what has been described in HPI.    OBJECTIVE: Temp:  [97.9 F (36.6 C)-103.1 F (39.5 C)] 98.4 F  (36.9 C) (06/04 0840) Pulse Rate:  [57-100] 67 (06/04 0840) Resp:  [16-22] 16 (06/04 0840) BP: (107-153)/(66-97) 133/77 (06/04 0840) SpO2:  [95 %-100 %] 95 % (06/04 0840) Weight:  [172 lb (78 kg)] 172 lb (78 kg) (06/03 1543)   General appearance: alert, cooperative and no distress Eyes: conjunctivae/corneas clear. PERRL, EOM's intact. Fundi benign. Throat: lips, mucosa, and tongue normal; teeth and gums normal Neck: no adenopathy, no carotid bruit, no JVD, supple, symmetrical, trachea midline, thyroid not enlarged, symmetric, no tenderness/mass/nodules and no nuchal rigidity or impairment to ROM Resp: clear to auscultation bilaterally and normal effort and rate Cardio: regular rate and rhythm, S1, S2 normal, no murmur, click, rub or gallop GI: soft, non-tender; bowel sounds normal; no masses,  no organomegaly Extremities: extremities normal, atraumatic, no cyanosis or edema Pulses: 2+ and symmetric Skin: Skin color, texture, turgor normal. No rashes or lesions or Left Inner Thigh - small papule with red base noted in area of where he pulled tick off him this morning Lymph nodes: Cervical, supraclavicular, and axillary nodes normal Neurologic: Alert and oriented X 3, normal strength and tone. Normal symmetric reflexes. CN II-XII intact with exam.   LABS: Results for orders placed or performed during the hospital encounter of 06/10/16 (from the past 48 hour(s))  Comprehensive metabolic panel     Status: Abnormal   Collection Time: 06/10/16  2:51 PM  Result Value Ref Range   Sodium 132 (L) 135 - 145 mmol/L   Potassium 3.5 3.5 - 5.1 mmol/L   Chloride 98 (L) 101 - 111 mmol/L   CO2 23 22 - 32 mmol/L   Glucose, Bld 201 (H) 65 - 99 mg/dL   BUN 11 6 - 20 mg/dL   Creatinine, Ser 1.13 0.61 - 1.24 mg/dL   Calcium 8.7 (L) 8.9 - 10.3 mg/dL   Total Protein 6.9 6.5 - 8.1 g/dL   Albumin 3.5 3.5 - 5.0 g/dL   AST 24 15 - 41 U/L   ALT 17 17 - 63 U/L   Alkaline Phosphatase 74 38 - 126 U/L   Total  Bilirubin 1.0 0.3 - 1.2 mg/dL   GFR calc non Af Amer >60 >60 mL/min   GFR calc Af Amer >60 >60 mL/min    Comment: (NOTE) The eGFR has been calculated using the CKD EPI equation. This calculation has not been validated in all clinical situations. eGFR's persistently <60 mL/min signify possible Chronic Kidney Disease.    Anion gap 11 5 - 15  CBC with Differential     Status: Abnormal   Collection Time: 06/10/16  2:51 PM  Result Value Ref Range   WBC 8.9 4.0 - 10.5 K/uL  RBC 5.21 4.22 - 5.81 MIL/uL   Hemoglobin 15.8 13.0 - 17.0 g/dL   HCT 45.0 39.0 - 52.0 %   MCV 86.4 78.0 - 100.0 fL   MCH 30.3 26.0 - 34.0 pg   MCHC 35.1 30.0 - 36.0 g/dL   RDW 12.9 11.5 - 15.5 %   Platelets 159 150 - 400 K/uL   Neutrophils Relative % 83 %   Neutro Abs 7.4 1.7 - 7.7 K/uL   Lymphocytes Relative 7 %   Lymphs Abs 0.6 (L) 0.7 - 4.0 K/uL   Monocytes Relative 10 %   Monocytes Absolute 0.9 0.1 - 1.0 K/uL   Eosinophils Relative 0 %   Eosinophils Absolute 0.0 0.0 - 0.7 K/uL   Basophils Relative 0 %   Basophils Absolute 0.0 0.0 - 0.1 K/uL  I-Stat CG4 Lactic Acid, ED     Status: Abnormal   Collection Time: 06/10/16  3:11 PM  Result Value Ref Range   Lactic Acid, Venous 2.06 (HH) 0.5 - 1.9 mmol/L   Comment NOTIFIED PHYSICIAN   Urinalysis, Routine w reflex microscopic     Status: Abnormal   Collection Time: 06/10/16  3:38 PM  Result Value Ref Range   Color, Urine YELLOW YELLOW   APPearance CLEAR CLEAR   Specific Gravity, Urine 1.020 1.005 - 1.030   pH 5.0 5.0 - 8.0   Glucose, UA 50 (A) NEGATIVE mg/dL   Hgb urine dipstick MODERATE (A) NEGATIVE   Bilirubin Urine NEGATIVE NEGATIVE   Ketones, ur 20 (A) NEGATIVE mg/dL   Protein, ur 100 (A) NEGATIVE mg/dL   Nitrite NEGATIVE NEGATIVE   Leukocytes, UA NEGATIVE NEGATIVE   RBC / HPF 6-30 0 - 5 RBC/hpf   WBC, UA 0-5 0 - 5 WBC/hpf   Bacteria, UA NONE SEEN NONE SEEN   Squamous Epithelial / LPF NONE SEEN NONE SEEN   Mucous PRESENT   Influenza panel by PCR  (type A & B)     Status: None   Collection Time: 06/10/16  5:11 PM  Result Value Ref Range   Influenza A By PCR NEGATIVE NEGATIVE   Influenza B By PCR NEGATIVE NEGATIVE    Comment: (NOTE) The Xpert Xpress Flu assay is intended as an aid in the diagnosis of  influenza and should not be used as a sole basis for treatment.  This  assay is FDA approved for nasopharyngeal swab specimens only. Nasal  washings and aspirates are unacceptable for Xpert Xpress Flu testing.   Respiratory Panel by PCR     Status: None   Collection Time: 06/10/16  5:11 PM  Result Value Ref Range   Adenovirus NOT DETECTED NOT DETECTED   Coronavirus 229E NOT DETECTED NOT DETECTED   Coronavirus HKU1 NOT DETECTED NOT DETECTED   Coronavirus NL63 NOT DETECTED NOT DETECTED   Coronavirus OC43 NOT DETECTED NOT DETECTED   Metapneumovirus NOT DETECTED NOT DETECTED   Rhinovirus / Enterovirus NOT DETECTED NOT DETECTED   Influenza A NOT DETECTED NOT DETECTED   Influenza B NOT DETECTED NOT DETECTED   Parainfluenza Virus 1 NOT DETECTED NOT DETECTED   Parainfluenza Virus 2 NOT DETECTED NOT DETECTED   Parainfluenza Virus 3 NOT DETECTED NOT DETECTED   Parainfluenza Virus 4 NOT DETECTED NOT DETECTED   Respiratory Syncytial Virus NOT DETECTED NOT DETECTED   Bordetella pertussis NOT DETECTED NOT DETECTED   Chlamydophila pneumoniae NOT DETECTED NOT DETECTED   Mycoplasma pneumoniae NOT DETECTED NOT DETECTED  I-Stat CG4 Lactic Acid, ED  Status: None   Collection Time: 06/10/16  5:43 PM  Result Value Ref Range   Lactic Acid, Venous 1.06 0.5 - 1.9 mmol/L  CSF cell count with differential collection tube #: 1     Status: None   Collection Time: 06/10/16  9:24 PM  Result Value Ref Range   Tube # 1    Color, CSF COLORLESS COLORLESS   Appearance, CSF CLEAR CLEAR   Supernatant NOT INDICATED    RBC Count, CSF 0 0 /cu mm   WBC, CSF 1 0 - 5 /cu mm   Other Cells, CSF TOO FEW TO COUNT, SMEAR AVAILABLE FOR REVIEW     Comment: FEW  LYMPHOCYTES AND MONOCYTES  CSF culture     Status: None (Preliminary result)   Collection Time: 06/10/16  9:24 PM  Result Value Ref Range   Specimen Description CSF    Special Requests NO 2    Gram Stain      CYTOSPIN SMEAR WBC PRESENT, PREDOMINANTLY MONONUCLEAR NO ORGANISMS SEEN    Culture PENDING    Report Status PENDING   Glucose, CSF     Status: Abnormal   Collection Time: 06/10/16  9:24 PM  Result Value Ref Range   Glucose, CSF 84 (H) 40 - 70 mg/dL  Protein, CSF     Status: Abnormal   Collection Time: 06/10/16  9:24 PM  Result Value Ref Range   Total  Protein, CSF 52 (H) 15 - 45 mg/dL  Cryptococcal antigen, CSF     Status: Abnormal   Collection Time: 06/10/16  9:24 PM  Result Value Ref Range   Crypto Ag NEGATIVE NEGATIVE   Cryptococcal Ag Titer NONE (A) NOT INDICATED  CSF cell count with differential collection tube #: 4     Status: Abnormal   Collection Time: 06/10/16  9:26 PM  Result Value Ref Range   Tube # 4    Color, CSF COLORLESS COLORLESS   Appearance, CSF CLEAR CLEAR   Supernatant NOT INDICATED    RBC Count, CSF 1 (H) 0 /cu mm   WBC, CSF 3 0 - 5 /cu mm   Other Cells, CSF TOO FEW TO COUNT, SMEAR AVAILABLE FOR REVIEW     Comment: FEW LYMPHOCYTES AND MONOCYTES  Glucose, capillary     Status: Abnormal   Collection Time: 06/11/16  1:04 AM  Result Value Ref Range   Glucose-Capillary 146 (H) 65 - 99 mg/dL  Lactic acid, plasma     Status: None   Collection Time: 06/11/16  1:20 AM  Result Value Ref Range   Lactic Acid, Venous 1.0 0.5 - 1.9 mmol/L  Procalcitonin     Status: None   Collection Time: 06/11/16  1:20 AM  Result Value Ref Range   Procalcitonin 0.39 ng/mL    Comment:        Interpretation: PCT (Procalcitonin) <= 0.5 ng/mL: Systemic infection (sepsis) is not likely. Local bacterial infection is possible. (NOTE)         ICU PCT Algorithm               Non ICU PCT Algorithm    ----------------------------     ------------------------------         PCT  < 0.25 ng/mL                 PCT < 0.1 ng/mL     Stopping of antibiotics            Stopping of antibiotics  strongly encouraged.               strongly encouraged.    ----------------------------     ------------------------------       PCT level decrease by               PCT < 0.25 ng/mL       >= 80% from peak PCT       OR PCT 0.25 - 0.5 ng/mL          Stopping of antibiotics                                             encouraged.     Stopping of antibiotics           encouraged.    ----------------------------     ------------------------------       PCT level decrease by              PCT >= 0.25 ng/mL       < 80% from peak PCT        AND PCT >= 0.5 ng/mL            Continuin g antibiotics                                              encouraged.       Continuing antibiotics            encouraged.    ----------------------------     ------------------------------     PCT level increase compared          PCT > 0.5 ng/mL         with peak PCT AND          PCT >= 0.5 ng/mL             Escalation of antibiotics                                          strongly encouraged.      Escalation of antibiotics        strongly encouraged.   Protime-INR     Status: None   Collection Time: 06/11/16  1:20 AM  Result Value Ref Range   Prothrombin Time 15.1 11.4 - 15.2 seconds   INR 4.17   Basic metabolic panel     Status: Abnormal   Collection Time: 06/11/16  1:20 AM  Result Value Ref Range   Sodium 132 (L) 135 - 145 mmol/L   Potassium 3.6 3.5 - 5.1 mmol/L   Chloride 101 101 - 111 mmol/L   CO2 23 22 - 32 mmol/L   Glucose, Bld 156 (H) 65 - 99 mg/dL   BUN 11 6 - 20 mg/dL   Creatinine, Ser 1.02 0.61 - 1.24 mg/dL   Calcium 7.8 (L) 8.9 - 10.3 mg/dL   GFR calc non Af Amer >60 >60 mL/min   GFR calc Af Amer >60 >60 mL/min    Comment: (NOTE) The eGFR has been calculated using the CKD EPI equation. This calculation has not been validated in all clinical situations. eGFR's persistently <60  mL/min signify  possible Chronic Kidney Disease.    Anion gap 8 5 - 15  CBC     Status: Abnormal   Collection Time: 06/11/16  1:20 AM  Result Value Ref Range   WBC 6.8 4.0 - 10.5 K/uL   RBC 4.30 4.22 - 5.81 MIL/uL   Hemoglobin 12.8 (L) 13.0 - 17.0 g/dL   HCT 37.4 (L) 39.0 - 52.0 %   MCV 87.0 78.0 - 100.0 fL   MCH 29.8 26.0 - 34.0 pg   MCHC 34.2 30.0 - 36.0 g/dL   RDW 13.0 11.5 - 15.5 %   Platelets 137 (L) 150 - 400 K/uL  Lactic acid, plasma     Status: None   Collection Time: 06/11/16  5:01 AM  Result Value Ref Range   Lactic Acid, Venous 1.1 0.5 - 1.9 mmol/L  Glucose, capillary     Status: Abnormal   Collection Time: 06/11/16  7:43 AM  Result Value Ref Range   Glucose-Capillary 163 (H) 65 - 99 mg/dL    MICRO: LP 6/3 >> WBC 3 >> 1, RBC 1 >> 0, Protein 52 and glucose 84 CSF Cx 6/3 >> pending, no organisms seen on GS  Arbovirus IgG >> pending  HSV PCR >> pending  VDRL >> pending Resp Panel 6/3 >> neg  BCx 6/3 >> pending RMSM Ab >> pending B. Burgdorfi Ab >> pending   IMAGING: Dg Chest 2 View  Result Date: 06/10/2016 CLINICAL DATA:  Fever EXAM: CHEST  2 VIEW COMPARISON:  07/08/2006 FINDINGS: Low lung volumes. Heart size is accentuated by the AP nature of the study and the low lung volumes, felt to be within normal limits. No confluent opacities or effusions. No acute bony abnormality. IMPRESSION: No active cardiopulmonary disease. Electronically Signed   By: Rolm Baptise M.D.   On: 06/10/2016 18:41   Ct Head Wo Contrast  Result Date: 06/10/2016 CLINICAL DATA:  Headaches EXAM: CT HEAD WITHOUT CONTRAST TECHNIQUE: Contiguous axial images were obtained from the base of the skull through the vertex without intravenous contrast. COMPARISON:  None. FINDINGS: Brain: Diffuse atrophic changes are noted. No findings to suggest acute hemorrhage, acute infarction or space-occupying mass lesion are noted. Vascular: No hyperdense vessel or unexpected calcification. Skull: Normal. Negative for  fracture or focal lesion. Sinuses/Orbits: No acute finding. Other: None IMPRESSION: Diffuse atrophic changes.  No acute abnormality is noted. Electronically Signed   By: Inez Catalina M.D.   On: 06/10/2016 20:31    HISTORICAL MICRO/IMAGING  Assessment/Plan:  81 yo male with fevers, arthralgias/myalgias and history of tick bites and wildlife exposure.   Fevers =  - Continue Doxycycline as there is potential for tick-born illness - Would D/C Vanco/Zosyn as there does not seem to be a bacterial component to his presentation  HA = confusion/encephalitis symptoms - LP does not indicate bacterial meningitis.  - D/C Droplet Precautions - HSV pending, for now can continue the acyclovir.  - Neurologic exam normal  Would continue the doxy and acyclovir until HSV pends back. Continue to watch for clinical improvement and await serologies.   Janene Madeira, MSN, NP-C [facility] [place] Cell: [phone] Pager: [phone]  06/11/2016 10:34 AM

## 2016-06-11 NOTE — Progress Notes (Signed)
Received report from ED RN, Caitlynn.  

## 2016-06-11 NOTE — Progress Notes (Signed)
NURSING PROGRESS NOTE  Mark Bowen [id number] Admission Data: 06/11/2016 1:31 AM Attending Provider: Ivor Costa, MD [facility]:[id number], Elta Guadel[place], MD Code Status: Full  Allergies:  Iodine and Iohexol Past Medical History:   has a past medical history of Arthritis; Diabetes mellitus (2007); Diverticulitis; GERD (gastroesophageal reflux disease); Hypercholesterolemia (since 2008 preventative); Hypertension (since 1970); Prostate CA ([place]); and Shingles. Past Surgical History:   has a past surgical history that includes Tonsillectomy (1960); Inguinal hernia repair (1970); Appendectomy (1960); Back surgery (1960 & 2009); and Prostate surgery. Social History:   reports that he quit smoking about 57 years ago. He has never used smokeless tobacco. He reports that he does not drink alcohol or use drugs.  Mark Bowen is a 81 y.o. male patient admitted from ED:   Last Documented Vital Signs: Blood pressure (!) 122/97, pulse (!) 57, temperature 97.9 F (36.6 C), temperature source Oral, resp. rate 18, height 5\' 9"  (1.753 m), weight 78 kg (172 lb), SpO2 99 %.  Cardiac Monitoring: Box # 13 in place. Cardiac monitor yields:normal sinus rhythm.  IV Fluids:  IV in place, occlusive dsg intact without redness, IV cath antecubital left, condition patent and no redness normal saline.   Skin: Appropriate for ethnicity and intact with exception of abrasion to left lower leg.  Patient orientated to room. Information packet given to patient. Admission inpatient armband information verified with patient to include name and date of birth and placed on patient arm. Side rails up x 2, fall assessment and education completed with patient. Patient able to verbalize understanding of risk associated with falls and verbalized understanding to call for assistance before getting out of bed. Call light within reach. Patient able to voice and demonstrate understanding of unit orientation instructions.    Will continue to evaluate and  treat per MD orders.  Clydell Hakim RN, BSN

## 2016-06-11 NOTE — Progress Notes (Signed)
Patient asked RN to look at left inner thigh because he thought he had a mole or a tick on his leg.  RN then removed a tick from the patient's leg and patient said that it was a seed tick. On initial assessment both RNs thought that the tick was a mole.  Tick placed in specimen container for MD to see.  Patient doesn't complain of any pain or itching at the site.  RN will continue to monitor patient and pass information on to on comming RN.

## 2016-06-11 NOTE — Progress Notes (Addendum)
PROGRESS NOTE   Mark Bowen  OZH:[id number]    DOB: 05-15-1935    DOA: 06/10/2016  PCP: [name], MD   I have briefly reviewed patients previous medical records in [facility].  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 History of tick exposure (a tic was removed from left upper medial thigh in the hospital) & mosquito bites. No cough, dyspnea, dysuria, urinary frequency, arthralgias/arthritis or skin rash. Admitted for sepsis evaluation and management. Infectious disease consulted.   Assessment & Plan:   Principal Problem:   Sepsis ([place]) Active Problems:   Essential hypertension   GERD   HLD (hyperlipidemia)   Diabetes mellitus without complication ([place])   1. Sepsis: Unclear etiology.? Tick borne illness versus other causes related to his occupation of wildlife exposure. UA, chest x-ray, flu panel PCR, RSV negative. CSF not indicative of acute bacterial meningitis, cryptococcal antigen negative. Pro-calcitonin elevated at 0.39. Mildly elevated lactate has normalized. CSF culture, urine culture, blood cultures 2: Pending. CSF VDRL, arbovirus, HSV PCR pending. RMSF and Borrelia serology pending. CT head without acute findings. Treated with IV fluids and started empirically on IV vancomycin, Zosyn, acyclovir and doxycycline. Clinically somewhat better. Infectious disease consulted for assistance.? DC acyclovir, vancomycin and Zosyn. Symptomatic treatment. 2. Mild thrombocytopenia: Possibly related to problem #1. No bleeding reported. Follow CBCs. 3. Dehydration with hyponatremia: Secondary to fevers, sepsis, GI losses and poor oral intake. Brief IV fluids. Encourage oral  intake. Follow BMP in a.m. 4. Essential hypertension: Controlled. Benazepril currently on hold. Continue IV when necessary hydralazine. 5. DM type II: Follow A1c. Continue SSI. General met on hold. 6. Hyperlipidemia: Continue statins. 7. GERD: Asymptomatic. Continue Protonix.  8. Tick bite: A small tick (? Seed tick) removed from patient's left upper thigh. Discussion as above.   DVT prophylaxis: SCDs Code Status: Full Family Communication: Discussed in detail with daughter at bedside. Updated care and answered questions. Disposition: DC home when medically improved and stable.   Consultants:  Infectious disease   Procedures:  Lumbar puncture  Antimicrobials:  IV Zosyn IV vancomycin IV doxycycline IV acyclovir    Subjective: Complaints of headache at the vertex, improved, 4/10, mostly on touching the scalp. Ongoing myalgia but no arthralgia or arthritis. No BM or vomiting over the last 24 hours. No skin rash reported.  ROS: Denies dyspnea, cough, chest pain, urinary frequency or dysuria.  Objective:  Vitals:   06/11/16 0428 06/11/16 0515 06/11/16 0619 06/11/16 0840  BP:  132/76  133/77  Pulse:  74  67  Resp:  18  16  Temp: 98.6 F (37 C) (!) 101.2 F (38.4 C) 99.5 F (37.5 C) 98.4 F (36.9 C)  TempSrc: Oral  Oral Oral  SpO2:  100%  95%  Weight:      Height:        Examination:  General exam: Pleasant elderly male, moderately built and nourished, lying comfortably supine in bed. Does not look septic or toxic currently. Respiratory system: Clear to auscultation. Respiratory effort normal. Cardiovascular system: S1 & S2 heard, RRR. No JVD, murmurs, rubs, gallops or clicks. No pedal edema. Telemetry personally reviewed: SB in the 50s-SR in the 60s. Gastrointestinal system: Abdomen  is nondistended, soft and nontender. No organomegaly or masses felt. Normal bowel sounds heard. Central nervous system: Alert and oriented. No focal neurological deficits. Extremities:  Symmetric 5 x 5 power. Skin: No rashes, lesions or ulcers Psychiatry: Judgement and insight appear normal. Mood & affect appropriate.     Data Reviewed: I have personally reviewed following labs and imaging studies  CBC:  Recent Labs Lab 06/10/16 1451 06/11/16 0120  WBC 8.9 6.8  NEUTROABS 7.4  --   HGB 15.8 12.8*  HCT 45.0 37.4*  MCV 86.4 87.0  PLT 159 720*   Basic Metabolic Panel:  Recent Labs Lab 06/10/16 1451 06/11/16 0120  NA 132* 132*  K 3.5 3.6  CL 98* 101  CO2 23 23  GLUCOSE 201* 156*  BUN 11 11  CREATININE 1.13 1.02  CALCIUM 8.7* 7.8*   Liver Function Tests:  Recent Labs Lab 06/10/16 1451  AST 24  ALT 17  ALKPHOS 74  BILITOT 1.0  PROT 6.9  ALBUMIN 3.5   Coagulation Profile:  Recent Labs Lab 06/11/16 0120  INR 1.18   CBG:  Recent Labs Lab 06/11/16 0104 06/11/16 0743  GLUCAP 146* 163*    Recent Results (from the past 240 hour(s))  Respiratory Panel by PCR     Status: None   Collection Time: 06/10/16  5:11 PM  Result Value Ref Range Status   Adenovirus NOT DETECTED NOT DETECTED Final   Coronavirus 229E NOT DETECTED NOT DETECTED Final   Coronavirus HKU1 NOT DETECTED NOT DETECTED Final   Coronavirus NL63 NOT DETECTED NOT DETECTED Final   Coronavirus OC43 NOT DETECTED NOT DETECTED Final   Metapneumovirus NOT DETECTED NOT DETECTED Final   Rhinovirus / Enterovirus NOT DETECTED NOT DETECTED Final   Influenza A NOT DETECTED NOT DETECTED Final   Influenza B NOT DETECTED NOT DETECTED Final   Parainfluenza Virus 1 NOT DETECTED NOT DETECTED Final   Parainfluenza Virus 2 NOT DETECTED NOT DETECTED Final   Parainfluenza Virus 3 NOT DETECTED NOT DETECTED Final   Parainfluenza Virus 4 NOT DETECTED NOT DETECTED Final   Respiratory Syncytial Virus NOT DETECTED NOT DETECTED Final   Bordetella pertussis NOT DETECTED NOT DETECTED Final   Chlamydophila pneumoniae NOT DETECTED NOT DETECTED Final   Mycoplasma pneumoniae NOT DETECTED NOT DETECTED Final    CSF culture     Status: None (Preliminary result)   Collection Time: 06/10/16  9:24 PM  Result Value Ref Range Status   Specimen Description CSF  Final   Special Requests NO 2  Final   Gram Stain   Final    CYTOSPIN SMEAR WBC PRESENT, PREDOMINANTLY MONONUCLEAR NO ORGANISMS SEEN    Culture PENDING  Incomplete   Report Status PENDING  Incomplete         Radiology Studies: Dg Chest 2 View  Result Date: 06/10/2016 CLINICAL DATA:  Fever EXAM: CHEST  2 VIEW COMPARISON:  07/08/2006 FINDINGS: Low lung volumes. Heart size is accentuated by the AP nature of the study and the low lung volumes, felt to be within normal limits. No confluent opacities or effusions. No acute bony abnormality. IMPRESSION: No active cardiopulmonary disease. Electronically Signed   By: Rolm Baptise M.D.   On: 06/10/2016 18:41   Ct Head Wo Contrast  Result Date: 06/10/2016 CLINICAL DATA:  Headaches EXAM: CT HEAD WITHOUT CONTRAST TECHNIQUE: Contiguous axial images were obtained from the base of the skull through the vertex without intravenous contrast. COMPARISON:  None. FINDINGS: Brain: Diffuse atrophic changes are noted. No findings  to suggest acute hemorrhage, acute infarction or space-occupying mass lesion are noted. Vascular: No hyperdense vessel or unexpected calcification. Skull: Normal. Negative for fracture or focal lesion. Sinuses/Orbits: No acute finding. Other: None IMPRESSION: Diffuse atrophic changes.  No acute abnormality is noted. Electronically Signed   By: Inez Catalina M.D.   On: 06/10/2016 20:31        Scheduled Meds: . aspirin  81 mg Oral Daily  . insulin aspart  0-5 Units Subcutaneous QHS  . insulin aspart  0-9 Units Subcutaneous TID WC  . omega-3 acid ethyl esters  1 g Oral Daily  . pantoprazole  40 mg Oral Daily  . simvastatin  20 mg Oral q1800  . sodium chloride flush  3 mL Intravenous Q12H   Continuous Infusions: . sodium chloride 100 mL/hr at 06/11/16 0205  . acyclovir Stopped  (06/11/16 0310)  . doxycycline (VIBRAMYCIN) IV Stopped (06/11/16 0405)  . piperacillin-tazobactam (ZOSYN)  IV    . vancomycin       LOS: 1 day     Aikam Vinje, MD, FACP, FHM. [facility] Pager [phone]  If 7PM-7AM, please contact night-coverage [email or web address] Password [email or web address] 06/11/2016, 10:46 AM

## 2016-06-11 NOTE — Progress Notes (Signed)
[facility] Infusion Coordinator will follow pt with ID team to support home IV ABX if needed at DC.   If patient discharges after hours, please call [phone].   Larry Sierras 06/11/2016, 4:48 PM

## 2016-06-12 DIAGNOSIS — I10 Essential (primary) hypertension: Secondary | ICD-10-CM

## 2016-06-12 DIAGNOSIS — A419 Sepsis, unspecified organism: Secondary | ICD-10-CM

## 2016-06-12 DIAGNOSIS — K219 Gastro-esophageal reflux disease without esophagitis: Secondary | ICD-10-CM

## 2016-06-12 DIAGNOSIS — R509 Fever, unspecified: Secondary | ICD-10-CM

## 2016-06-12 DIAGNOSIS — E119 Type 2 diabetes mellitus without complications: Secondary | ICD-10-CM

## 2016-06-12 LAB — CBC WITH DIFFERENTIAL/PLATELET
BASOS ABS: 0 10*3/uL (ref 0.0–0.1)
Basophils Relative: 0 %
EOS ABS: 0 10*3/uL (ref 0.0–0.7)
EOS PCT: 0 %
HCT: 35.6 % — ABNORMAL LOW (ref 39.0–52.0)
Hemoglobin: 11.9 g/dL — ABNORMAL LOW (ref 13.0–17.0)
Lymphocytes Relative: 14 %
Lymphs Abs: 0.6 10*3/uL — ABNORMAL LOW (ref 0.7–4.0)
MCH: 28.5 pg (ref 26.0–34.0)
MCHC: 33.4 g/dL (ref 30.0–36.0)
MCV: 85.4 fL (ref 78.0–100.0)
Monocytes Absolute: 0.4 10*3/uL (ref 0.1–1.0)
Monocytes Relative: 9 %
Neutro Abs: 3.1 10*3/uL (ref 1.7–7.7)
Neutrophils Relative %: 77 %
PLATELETS: 122 10*3/uL — AB (ref 150–400)
RBC: 4.17 MIL/uL — AB (ref 4.22–5.81)
RDW: 13 % (ref 11.5–15.5)
WBC: 4.1 10*3/uL (ref 4.0–10.5)

## 2016-06-12 LAB — HEMOGLOBIN A1C
Hgb A1c MFr Bld: 7 % — ABNORMAL HIGH (ref 4.8–5.6)
Mean Plasma Glucose: 154 mg/dL

## 2016-06-12 LAB — GLUCOSE, CAPILLARY
GLUCOSE-CAPILLARY: 135 mg/dL — AB (ref 65–99)
GLUCOSE-CAPILLARY: 171 mg/dL — AB (ref 65–99)
GLUCOSE-CAPILLARY: 169 mg/dL — AB (ref 65–99)
Glucose-Capillary: 139 mg/dL — ABNORMAL HIGH (ref 65–99)
Glucose-Capillary: 172 mg/dL — ABNORMAL HIGH (ref 65–99)

## 2016-06-12 LAB — B. BURGDORFI ANTIBODIES: B burgdorferi Ab IgG+IgM: <0.91 {ISR} (ref 0.00–0.90)

## 2016-06-12 LAB — BASIC METABOLIC PANEL
Anion gap: 10 (ref 5–15)
BUN: 8 mg/dL (ref 6–20)
CALCIUM: 7.6 mg/dL — AB (ref 8.9–10.3)
CO2: 21 mmol/L — ABNORMAL LOW (ref 22–32)
CREATININE: 1 mg/dL (ref 0.61–1.24)
Chloride: 102 mmol/L (ref 101–111)
GFR calc Af Amer: >60 mL/min (ref >60)
Glucose, Bld: 154 mg/dL — ABNORMAL HIGH (ref 65–99)
POTASSIUM: 3.3 mmol/L — AB (ref 3.5–5.1)
SODIUM: 133 mmol/L — AB (ref 135–145)

## 2016-06-12 LAB — HERPES SIMPLEX VIRUS(HSV) DNA BY PCR
HSV 1 DNA: NEGATIVE
HSV 2 DNA: NEGATIVE

## 2016-06-12 LAB — URINE CULTURE

## 2016-06-12 LAB — VDRL, CSF: VDRL Quant, CSF: NONREACTIVE

## 2016-06-12 LAB — RMSF, IGG, IFA: RMSF, IGG, IFA: <1:64 {titer}

## 2016-06-12 LAB — ROCKY MTN SPOTTED FVR ABS PNL(IGG+IGM)
RMSF IgG: POSITIVE — AB
RMSF IgM: 0.25 index (ref 0.00–0.89)

## 2016-06-12 LAB — SEDIMENTATION RATE: Sed Rate: 33 mm/hr — ABNORMAL HIGH (ref 0–16)

## 2016-06-12 MED ORDER — POTASSIUM CHLORIDE CRYS ER 20 MEQ PO TBCR
40.0000 meq | EXTENDED_RELEASE_TABLET | Freq: Once | ORAL | Status: AC
  Administered 2016-06-12: 40 meq via ORAL
  Filled 2016-06-12: qty 2

## 2016-06-12 MED ORDER — POTASSIUM CHLORIDE IN NACL 20-0.9 MEQ/L-% IV SOLN
INTRAVENOUS | Status: DC
  Administered 2016-06-13 – 2016-06-15 (×4): via INTRAVENOUS
  Filled 2016-06-12 (×4): qty 1000

## 2016-06-12 NOTE — Progress Notes (Signed)
Mark Bowen for Infectious Disease    Date of Admission:  06/10/2016    Total days of antibiotics 3                                                                                     Day 3 Acyclovir, Doxycycline   ID: BRAXDEN LOVERING is a 81 y.o. male with  fevers, arthralgias/myalgias and history of tick bites and wildlife exposure.   Principal Problem:   Sepsis ([place]) Active Problems:   Essential hypertension   GERD   HLD (hyperlipidemia)   Diabetes mellitus without complication ([facility])   Subjective: Feeling better. Arthralgias/myalgias controlled with ibuprofen/tylenol and HA's improved. Only symptomatic when he has fevers. No further nausea or vomiting episodes. Passing urine fine and is having BMs normally today.   Medications:  . aspirin  81 mg Oral Daily  . insulin aspart  0-5 Units Subcutaneous QHS  . insulin aspart  0-9 Units Subcutaneous TID WC  . omega-3 acid ethyl esters  1 g Oral Daily  . pantoprazole  40 mg Oral Daily  . simvastatin  20 mg Oral q1800  . sodium chloride flush  3 mL Intravenous Q12H    Objective: Vital signs in last 24 hours: Temp:  [98.6 F (37 C)-103.2 F (39.6 C)] 100.1 F (37.8 C) (06/05 0544) Pulse Rate:  [65-79] 65 (06/05 0544) Resp:  [16-18] 18 (06/05 0544) BP: (111-128)/(63-65) 120/64 (06/05 0544) SpO2:  [92 %-100 %] 92 % (06/05 0544)   General appearance: alert, cooperative and no distress. Sitting up on side of bed eating breakfast Eyes: conjunctivae/corneas clear Resp: clear to auscultation bilaterally and normal effort and rate Cardio: regular rate and rhythm, S1, S2 normal, no murmur, click, rub or gallop GI: soft, non-tender; bowel sounds normal; no masses,  no organomegaly Extremities: extremities normal, atraumatic, no cyanosis or edema Pulses: 2+ and symmetric Skin: Skin color, texture, turgor normal. No rashes or lesions or Left Inner Thigh - no advancement of red papule where tick was removed.  Lymph nodes: Cervical,  supraclavicular, and axillary nodes normal Neurologic: Alert and oriented X 3, normal strength and tone.  Lab Results  Recent Labs  06/11/16 0120 06/12/16 0529  WBC 6.8 4.1  HGB 12.8* 11.9*  HCT 37.4* 35.6*  NA 132* 133*  K 3.6 3.3*  CL 101 102  CO2 23 21*  BUN 11 8  CREATININE 1.02 1.00   Liver Panel  Recent Labs  06/10/16 1451  PROT 6.9  ALBUMIN 3.5  AST 24  ALT 17  ALKPHOS 74  BILITOT 1.0   Sedimentation Rate No results for input(s): ESRSEDRATE in the last 72 hours. C-Reactive Protein No results for input(s): CRP in the last 72 hours.  Microbiology: LP 6/3 >> WBC 3 >> 1, RBC 1 >> 0, Protein 52 and glucose 84 CSF Cx 6/3 >> NG x 24h             Arbovirus IgG >> pending             HSV PCR >> pending             VDRL >> pending Resp Panel  6/3 >> neg  BCx 6/3 >> NG x 24h  RMSM Ab >> pending B. Burgdorfi Ab >> pending   Assessment/Plan: Fevers =  - Still with occasional fevers to 103 over last 12h. If this is RMSF can take a few days on Doxycycline to defervesce. - With suspicion for tick bite would continue Doxycycline. Would consider changing to PO today or tomorrow as he is holding down foods and has no further nausea. Would not treat for longer than 14 days total. No need for home IV antibiotics.   HA = confusion/encephalitis symptoms initially - Resolved  - LP does not indicate bacterial meningitis - HSV pending, for now can continue the acyclovir until ruled out  From our stand point he is OK for discharge home as he is improved symptomatically and fevers are controllable with ibuprofen/tylenol. Eating/drinking normally. Cautioned him today about sun exposure when he goes home with Doxycycline as it can cause severe sun burns.   Janene Madeira, MSN, NP-C [facility] for Infectious [place] Cell: [phone] Pager: [phone]  06/12/2016, 9:16 AM

## 2016-06-12 NOTE — Progress Notes (Signed)
PROGRESS NOTE   Mark Bowen  BDZ:[id number]    DOB: 01-16-1935    DOA: 06/10/2016  PCP: [name], MD   I have briefly reviewed patients previous medical records in [facility].  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 History of tick exposure (a tic was removed from left upper medial thigh in the hospital) & mosquito bites. No cough, dyspnea, dysuria, urinary frequency, arthralgias/arthritis or skin rash. Admitted for sepsis evaluation and management. Infectious disease consulted.   Assessment & Plan:   Principal Problem:   Sepsis ([place]) Active Problems:   Essential hypertension   GERD   HLD (hyperlipidemia)   Diabetes mellitus without complication ([place])   1. Sepsis: Continues to have occasional fever. Tick borne illness versus other causes related to his occupation of wildlife exposure. Continue doxycycline per infectious disease. UA, chest x-ray, flu panel PCR, RSV negative. CSF not indicative of acute bacterial meningitis, cryptococcal antigen negative. Pro-calcitonin elevated at 0.39. Mildly elevated lactate has normalized. CSF culture, urine culture, blood cultures 2: Pending. CSF VDRL, arbovirus, HSV PCR pending. RMSF and Borrelia serology pending. CT head without acute findings. Treated with IV fluids and started empirically on IV vancomycin, Zosyn, acyclovir and doxycycline. Clinically somewhat better. Infectious disease consulted for assistance.  DC acyclovir, vancomycin and Zosyn. Symptomatic treatment. 2. Confusion/suspected encephalitis-symptoms resolved, MB does not indicate bacterial meningitis. Acyclovir  Per ID recommendations 3. Mild thrombocytopenia:  Possibly related to problem #1. No bleeding reported. Follow CBCs. 4. Dehydration with hyponatremia: Secondary to fevers, sepsis, GI losses and poor oral intake. Brief IV fluids. Encourage oral intake. Follow BMP in a.m. 5. Essential hypertension: Controlled. Benazepril currently on hold. Continue IV when necessary hydralazine. 6. DM type II:  A1c 7.0 . Continue SSI.  Oral hypoglycemics on hold 7. Hyperlipidemia: Continue statins. 8. GERD: Asymptomatic. Continue Protonix.  9. Tick bite: A small tick (? Seed tick) removed from patient's left upper thigh. Discussion as above. 10. Hypokalemia-replete   DVT prophylaxis: SCDs Code Status: Full Family Communication: Discussed in detail with daughter at bedside. Updated care and answered questions. Disposition: DC home likely tomorrow   Consultants:  Infectious disease   Procedures:  Lumbar puncture  Antimicrobials:  IV Zosyn IV vancomycin IV doxycycline IV acyclovir    Subjective: Unable to discharge ,shaking chills ,rigors , HA   ROS: Denies dyspnea, cough, chest pain, urinary frequency or dysuria.  Objective:  Vitals:   06/11/16 2324 06/12/16 0412 06/12/16 0515 06/12/16 0544  BP:    120/64  Pulse:    65  Resp:    18  Temp: 99.5 F (37.5 C) (!) 103.1 F (39.5 C) (!) 100.4 F (38 C) 100.1 F (37.8 C)  TempSrc: Oral Oral Oral Oral  SpO2:    92%  Weight:      Height:        Examination:  General exam: Pleasant elderly male, moderately built and nourished, lying comfortably supine in bed. Does not look septic or toxic currently. Respiratory system: Clear to auscultation. Respiratory effort normal. Cardiovascular system: S1 & S2 heard, RRR. No JVD, murmurs, rubs, gallops or clicks. No pedal edema. Telemetry personally reviewed: SB in the 50s-SR in the 60s.  Gastrointestinal system: Abdomen is nondistended, soft and nontender. No organomegaly or masses felt. Normal bowel sounds heard. Central nervous system: Alert and  oriented. No focal neurological deficits. Extremities: Symmetric 5 x 5 power. Skin: No rashes, lesions or ulcers Psychiatry: Judgement and insight appear normal. Mood & affect appropriate.     Data Reviewed: I have personally reviewed following labs and imaging studies  CBC:  Recent Labs Lab 06/10/16 1451 06/11/16 0120 06/12/16 0529  WBC 8.9 6.8 4.1  NEUTROABS 7.4  --  3.1  HGB 15.8 12.8* 11.9*  HCT 45.0 37.4* 35.6*  MCV 86.4 87.0 85.4  PLT 159 137* 062*   Basic Metabolic Panel:  Recent Labs Lab 06/10/16 1451 06/11/16 0120 06/12/16 0529  NA 132* 132* 133*  K 3.5 3.6 3.3*  CL 98* 101 102  CO2 23 23 21*  GLUCOSE 201* 156* 154*  BUN 11 11 8   CREATININE 1.13 1.02 1.00  CALCIUM 8.7* 7.8* 7.6*   Liver Function Tests:  Recent Labs Lab 06/10/16 1451  AST 24  ALT 17  ALKPHOS 74  BILITOT 1.0  PROT 6.9  ALBUMIN 3.5   Coagulation Profile:  Recent Labs Lab 06/11/16 0120  INR 1.18   CBG:  Recent Labs Lab 06/11/16 0104 06/11/16 0743 06/11/16 1645 06/11/16 2156 06/12/16 0835  GLUCAP 146* 163* 168* 137* 135*    Recent Results (from the past 240 hour(s))  Culture, blood (Routine x 2)     Status: None (Preliminary result)   Collection Time: 06/10/16  3:37 PM  Result Value Ref Range Status   Specimen Description BLOOD LEFT ANTECUBITAL  Final   Special Requests   Final    BOTTLES DRAWN AEROBIC AND ANAEROBIC Blood Culture adequate volume   Culture NO GROWTH < 24 HOURS  Final   Report Status PENDING  Incomplete  Urine culture     Status: Abnormal   Collection Time: 06/10/16  3:38 PM  Result Value Ref Range Status   Specimen Description URINE, RANDOM  Final   Special Requests NONE  Final   Culture <10,000 COLONIES/mL INSIGNIFICANT GROWTH (A)  Final   Report Status 06/12/2016 FINAL  Final  Culture, blood (Routine x 2)     Status: None (Preliminary result)   Collection Time: 06/10/16  3:51 PM  Result Value Ref Range Status   Specimen Description BLOOD  RIGHT WRIST  Final   Special Requests   Final    BOTTLES DRAWN AEROBIC AND ANAEROBIC Blood Culture adequate volume   Culture NO GROWTH < 24 HOURS  Final   Report Status PENDING  Incomplete  Respiratory Panel by PCR     Status: None   Collection Time: 06/10/16  5:11 PM  Result Value Ref Range Status   Adenovirus NOT DETECTED NOT DETECTED Final   Coronavirus 229E NOT DETECTED NOT DETECTED Final   Coronavirus HKU1 NOT DETECTED NOT DETECTED Final   Coronavirus NL63 NOT DETECTED NOT DETECTED Final   Coronavirus OC43 NOT DETECTED NOT DETECTED Final   Metapneumovirus NOT DETECTED NOT DETECTED Final   Rhinovirus / Enterovirus NOT DETECTED NOT DETECTED Final   Influenza A NOT DETECTED NOT DETECTED Final   Influenza B NOT DETECTED NOT DETECTED Final   Parainfluenza Virus 1 NOT DETECTED NOT DETECTED Final   Parainfluenza Virus 2 NOT DETECTED NOT DETECTED Final   Parainfluenza Virus 3 NOT DETECTED NOT DETECTED Final   Parainfluenza Virus 4 NOT DETECTED NOT DETECTED Final   Respiratory Syncytial Virus NOT DETECTED NOT DETECTED Final  Bordetella pertussis NOT DETECTED NOT DETECTED Final   Chlamydophila pneumoniae NOT DETECTED NOT DETECTED Final   Mycoplasma pneumoniae NOT DETECTED NOT DETECTED Final  CSF culture     Status: None (Preliminary result)   Collection Time: 06/10/16  9:24 PM  Result Value Ref Range Status   Specimen Description CSF  Final   Special Requests NO 2  Final   Gram Stain   Final    CYTOSPIN SMEAR WBC PRESENT, PREDOMINANTLY MONONUCLEAR NO ORGANISMS SEEN    Culture NO GROWTH 2 DAYS  Final   Report Status PENDING  Incomplete         Radiology Studies: Dg Chest 2 View  Result Date: 06/10/2016 CLINICAL DATA:  Fever EXAM: CHEST  2 VIEW COMPARISON:  07/08/2006 FINDINGS: Low lung volumes. Heart size is accentuated by the AP nature of the study and the low lung volumes, felt to be within normal limits. No confluent opacities or effusions. No acute bony abnormality.  IMPRESSION: No active cardiopulmonary disease. Electronically Signed   By: Rolm Baptise M.D.   On: 06/10/2016 18:41   Ct Head Wo Contrast  Result Date: 06/10/2016 CLINICAL DATA:  Headaches EXAM: CT HEAD WITHOUT CONTRAST TECHNIQUE: Contiguous axial images were obtained from the base of the skull through the vertex without intravenous contrast. COMPARISON:  None. FINDINGS: Brain: Diffuse atrophic changes are noted. No findings to suggest acute hemorrhage, acute infarction or space-occupying mass lesion are noted. Vascular: No hyperdense vessel or unexpected calcification. Skull: Normal. Negative for fracture or focal lesion. Sinuses/Orbits: No acute finding. Other: None IMPRESSION: Diffuse atrophic changes.  No acute abnormality is noted. Electronically Signed   By: Inez Catalina M.D.   On: 06/10/2016 20:31        Scheduled Meds: . aspirin  81 mg Oral Daily  . insulin aspart  0-5 Units Subcutaneous QHS  . insulin aspart  0-9 Units Subcutaneous TID WC  . omega-3 acid ethyl esters  1 g Oral Daily  . pantoprazole  40 mg Oral Daily  . potassium chloride  40 mEq Oral Once  . simvastatin  20 mg Oral q1800  . sodium chloride flush  3 mL Intravenous Q12H   Continuous Infusions: . acyclovir Stopped (06/12/16 1047)  . doxycycline (VIBRAMYCIN) IV Stopped (06/12/16 0001)     LOS: 2 days     Reyne Dumas, MD,  [facility] Pager [phone]  If 7PM-7AM, please contact night-coverage [email or web address] Password [email or web address] 06/12/2016, 11:17 AM

## 2016-06-13 ENCOUNTER — Inpatient Hospital Stay (HOSPITAL_COMMUNITY): Payer: Medicare Other

## 2016-06-13 LAB — HSV CULTURE AND TYPING

## 2016-06-13 LAB — GLUCOSE, CAPILLARY
GLUCOSE-CAPILLARY: 117 mg/dL — AB (ref 65–99)
GLUCOSE-CAPILLARY: 179 mg/dL — AB (ref 65–99)
Glucose-Capillary: 121 mg/dL — ABNORMAL HIGH (ref 65–99)
Glucose-Capillary: 156 mg/dL — ABNORMAL HIGH (ref 65–99)

## 2016-06-13 MED ORDER — ACETAMINOPHEN 325 MG PO TABS: 650.0000 mg | ORAL_TABLET | Freq: Four times a day (QID) | ORAL | 2 refills | Status: DC | PRN

## 2016-06-13 MED ORDER — TRAMADOL HCL 50 MG PO TABS
50.0000 mg | ORAL_TABLET | Freq: Four times a day (QID) | ORAL | Status: DC | PRN
  Administered 2016-06-13 – 2016-06-15 (×5): 50 mg via ORAL
  Filled 2016-06-13 (×5): qty 1

## 2016-06-13 MED ORDER — SODIUM CHLORIDE 0.9 % IV BOLUS (SEPSIS)
500.0000 mL | Freq: Once | INTRAVENOUS | Status: AC
  Administered 2016-06-13: 500 mL via INTRAVENOUS

## 2016-06-13 MED ORDER — DOXYCYCLINE HYCLATE 50 MG PO CAPS: 100.0000 mg | ORAL_CAPSULE | Freq: Two times a day (BID) | ORAL | 0 refills | Status: AC

## 2016-06-13 MED ORDER — GABAPENTIN 600 MG PO TABS
300.0000 mg | ORAL_TABLET | Freq: Two times a day (BID) | ORAL | Status: DC
  Administered 2016-06-13 – 2016-06-15 (×5): 300 mg via ORAL
  Filled 2016-06-13 (×5): qty 1

## 2016-06-13 MED ORDER — BENAZEPRIL HCL 20 MG PO TABS: 20.0000 mg | ORAL_TABLET | Freq: Every day | ORAL | 1 refills | Status: DC

## 2016-06-13 NOTE — Discharge Summary (Addendum)
Physician Discharge Summary  RIELEY HAUSMAN MRN: [id number] DOB/AGE: Aug 05, 1935 81 y.o.  PCP: [name], MD   Admit date: 06/10/2016 Discharge date: 06/13/2016  Discharge Diagnoses:    Principal Problem:   Sepsis [facility]) Active Problems:   Essential hypertension   GERD   HLD (hyperlipidemia)   Diabetes mellitus without complication ([place])   Addendum PATIENT SPIKED FEVER DEVELOPED HA AND UPSET THAT HE IS NOT IMPROVING WILL CHECK MRI BRAIN AND REPEAT CXR HAVE NOTIFIED DR COMER TO EVALUATE HIM PRIOR  TO DC  Follow-up recommendations Follow-up with PCP in 3-5 days , including all  additional recommended appointments as below Follow-up CBC, CMP in 3-5 days Patient can follow-up with Dr. Scharlene Gloss if needed for persistent fevers      Current Discharge Medication List    START taking these medications   Details  acetaminophen (TYLENOL) 325 MG tablet Take 2 tablets (650 mg total) by mouth every 6 (six) hours as needed for mild pain, fever or headache. Qty: 30 tablet, Refills: 2    doxycycline (VIBRAMYCIN) 50 MG capsule Take 2 capsules (100 mg total) by mouth 2 (two) times daily. Qty: 40 capsule, Refills: 0      CONTINUE these medications which have CHANGED   Details  benazepril (LOTENSIN) 20 MG tablet Take 1 tablet (20 mg total) by mouth daily. Qty: 30 tablet, Refills: 1      CONTINUE these medications which have NOT CHANGED   Details  aspirin 81 MG tablet Take 81 mg by mouth daily.    fish oil-omega-3 fatty acids 1000 MG capsule Take 1 g by mouth daily.     omeprazole (PRILOSEC) 20 MG capsule Take 20 mg by mouth daily.    simvastatin (ZOCOR) 40 MG tablet Take 20 mg by mouth every morning.    sitaGLIPtan-metformin (JANUMET) 50-1000 MG per tablet Take 1 tablet by mouth 2 (two) times daily with a meal.         Discharge Condition: Stable   Discharge Instructions Get Medicines reviewed and adjusted: Please take all your medications with you for your next  visit with your Primary MD  Please request your Primary MD to go over all hospital tests and procedure/radiological results at the follow up, please ask your Primary MD to get all Hospital records sent to his/her office.  If you experience worsening of your admission symptoms, develop shortness of breath, life threatening emergency, suicidal or homicidal thoughts you must seek medical attention immediately by calling 911 or calling your MD immediately if symptoms less severe.  You must read complete instructions/literature along with all the possible adverse reactions/side effects for all the Medicines you take and that have been prescribed to you. Take any new Medicines after you have completely understood and accpet all the possible adverse reactions/side effects.   Do not drive when taking Pain medications.   Do not take more than prescribed Pain, Sleep and Anxiety Medications  Special Instructions: If you have smoked or chewed Tobacco in the last 2 yrs please stop smoking, stop any regular Alcohol and or any Recreational drug use.  Wear Seat belts while driving.  Please note  You were cared for by a hospitalist during your hospital stay. Once you are discharged, your primary care physician will handle any further medical issues. Please note that NO REFILLS for any discharge medications will be authorized once you are discharged, as it is imperative that you return to your primary care physician (or establish a relationship with a primary  care physician if you do not have one) for your aftercare needs so that they can reassess your need for medications and monitor your lab values.     Allergies  Allergen Reactions  . Iodine   . Iohexol      Code: HIVES, Desc: pt. states he breaks out in hives with iv dye       Disposition:  home   Consults:  Infectious disease    Significant Diagnostic Studies:  Dg Chest 2 View  Result Date: 06/10/2016 CLINICAL DATA:  Fever EXAM:  CHEST  2 VIEW COMPARISON:  07/08/2006 FINDINGS: Low lung volumes. Heart size is accentuated by the AP nature of the study and the low lung volumes, felt to be within normal limits. No confluent opacities or effusions. No acute bony abnormality. IMPRESSION: No active cardiopulmonary disease. Electronically Signed   By: Rolm Baptise M.D.   On: 06/10/2016 18:41   Ct Head Wo Contrast  Result Date: 06/10/2016 CLINICAL DATA:  Headaches EXAM: CT HEAD WITHOUT CONTRAST TECHNIQUE: Contiguous axial images were obtained from the base of the skull through the vertex without intravenous contrast. COMPARISON:  None. FINDINGS: Brain: Diffuse atrophic changes are noted. No findings to suggest acute hemorrhage, acute infarction or space-occupying mass lesion are noted. Vascular: No hyperdense vessel or unexpected calcification. Skull: Normal. Negative for fracture or focal lesion. Sinuses/Orbits: No acute finding. Other: None IMPRESSION: Diffuse atrophic changes.  No acute abnormality is noted. Electronically Signed   By: Inez Catalina M.D.   On: 06/10/2016 20:31       Filed Weights   06/10/16 1543  Weight: 78 kg (172 lb)     Microbiology: Recent Results (from the past 240 hour(s))  Culture, blood (Routine x 2)     Status: None (Preliminary result)   Collection Time: 06/10/16  3:37 PM  Result Value Ref Range Status   Specimen Description BLOOD LEFT ANTECUBITAL  Final   Special Requests   Final    BOTTLES DRAWN AEROBIC AND ANAEROBIC Blood Culture adequate volume   Culture NO GROWTH 2 DAYS  Final   Report Status PENDING  Incomplete  Urine culture     Status: Abnormal   Collection Time: 06/10/16  3:38 PM  Result Value Ref Range Status   Specimen Description URINE, RANDOM  Final   Special Requests NONE  Final   Culture <10,000 COLONIES/mL INSIGNIFICANT GROWTH (A)  Final   Report Status 06/12/2016 FINAL  Final  Culture, blood (Routine x 2)     Status: None (Preliminary result)   Collection Time: 06/10/16   3:51 PM  Result Value Ref Range Status   Specimen Description BLOOD RIGHT WRIST  Final   Special Requests   Final    BOTTLES DRAWN AEROBIC AND ANAEROBIC Blood Culture adequate volume   Culture NO GROWTH 2 DAYS  Final   Report Status PENDING  Incomplete  Respiratory Panel by PCR     Status: None   Collection Time: 06/10/16  5:11 PM  Result Value Ref Range Status   Adenovirus NOT DETECTED NOT DETECTED Final   Coronavirus 229E NOT DETECTED NOT DETECTED Final   Coronavirus HKU1 NOT DETECTED NOT DETECTED Final   Coronavirus NL63 NOT DETECTED NOT DETECTED Final   Coronavirus OC43 NOT DETECTED NOT DETECTED Final   Metapneumovirus NOT DETECTED NOT DETECTED Final   Rhinovirus / Enterovirus NOT DETECTED NOT DETECTED Final   Influenza A NOT DETECTED NOT DETECTED Final   Influenza B NOT DETECTED NOT DETECTED Final  Parainfluenza Virus 1 NOT DETECTED NOT DETECTED Final   Parainfluenza Virus 2 NOT DETECTED NOT DETECTED Final   Parainfluenza Virus 3 NOT DETECTED NOT DETECTED Final   Parainfluenza Virus 4 NOT DETECTED NOT DETECTED Final   Respiratory Syncytial Virus NOT DETECTED NOT DETECTED Final   Bordetella pertussis NOT DETECTED NOT DETECTED Final   Chlamydophila pneumoniae NOT DETECTED NOT DETECTED Final   Mycoplasma pneumoniae NOT DETECTED NOT DETECTED Final  CSF culture     Status: None (Preliminary result)   Collection Time: 06/10/16  9:24 PM  Result Value Ref Range Status   Specimen Description CSF  Final   Special Requests NO 2  Final   Gram Stain   Final    CYTOSPIN SMEAR WBC PRESENT, PREDOMINANTLY MONONUCLEAR NO ORGANISMS SEEN    Culture NO GROWTH 3 DAYS  Final   Report Status PENDING  Incomplete       Blood Culture    Component Value Date/Time   SDES CSF 06/10/2016 2124   SPECREQUEST NO 2 06/10/2016 2124   CULT NO GROWTH 3 DAYS 06/10/2016 2124   REPTSTATUS PENDING 06/10/2016 2124      Labs: Results for orders placed or performed during the hospital encounter of  06/10/16 (from the past 48 hour(s))  Glucose, capillary     Status: Abnormal   Collection Time: 06/11/16  4:45 PM  Result Value Ref Range   Glucose-Capillary 168 (H) 65 - 99 mg/dL  Glucose, capillary     Status: Abnormal   Collection Time: 06/11/16  9:56 PM  Result Value Ref Range   Glucose-Capillary 137 (H) 65 - 99 mg/dL  CBC with Differential/Platelet     Status: Abnormal   Collection Time: 06/12/16  5:29 AM  Result Value Ref Range   WBC 4.1 4.0 - 10.5 K/uL   RBC 4.17 (L) 4.22 - 5.81 MIL/uL   Hemoglobin 11.9 (L) 13.0 - 17.0 g/dL   HCT 35.6 (L) 39.0 - 52.0 %   MCV 85.4 78.0 - 100.0 fL   MCH 28.5 26.0 - 34.0 pg   MCHC 33.4 30.0 - 36.0 g/dL   RDW 13.0 11.5 - 15.5 %   Platelets 122 (L) 150 - 400 K/uL   Neutrophils Relative % 77 %   Neutro Abs 3.1 1.7 - 7.7 K/uL   Lymphocytes Relative 14 %   Lymphs Abs 0.6 (L) 0.7 - 4.0 K/uL   Monocytes Relative 9 %   Monocytes Absolute 0.4 0.1 - 1.0 K/uL   Eosinophils Relative 0 %   Eosinophils Absolute 0.0 0.0 - 0.7 K/uL   Basophils Relative 0 %   Basophils Absolute 0.0 0.0 - 0.1 K/uL  Basic metabolic panel     Status: Abnormal   Collection Time: 06/12/16  5:29 AM  Result Value Ref Range   Sodium 133 (L) 135 - 145 mmol/L   Potassium 3.3 (L) 3.5 - 5.1 mmol/L   Chloride 102 101 - 111 mmol/L   CO2 21 (L) 22 - 32 mmol/L   Glucose, Bld 154 (H) 65 - 99 mg/dL   BUN 8 6 - 20 mg/dL   Creatinine, Ser 1.00 0.61 - 1.24 mg/dL   Calcium 7.6 (L) 8.9 - 10.3 mg/dL   GFR calc non Af Amer >60 >60 mL/min   GFR calc Af Amer >60 >60 mL/min    Comment: (NOTE) The eGFR has been calculated using the CKD EPI equation. This calculation has not been validated in all clinical situations. eGFR's persistently <60 mL/min signify possible Chronic  Kidney Disease.    Anion gap 10 5 - 15  Glucose, capillary     Status: Abnormal   Collection Time: 06/12/16  8:35 AM  Result Value Ref Range   Glucose-Capillary 135 (H) 65 - 99 mg/dL   Comment 1 Document in Chart    Glucose, capillary     Status: Abnormal   Collection Time: 06/12/16 12:04 PM  Result Value Ref Range   Glucose-Capillary 139 (H) 65 - 99 mg/dL   Comment 1 Document in Chart   Sedimentation rate     Status: Abnormal   Collection Time: 06/12/16  2:46 PM  Result Value Ref Range   Sed Rate 33 (H) 0 - 16 mm/hr  Glucose, capillary     Status: Abnormal   Collection Time: 06/12/16  4:24 PM  Result Value Ref Range   Glucose-Capillary 172 (H) 65 - 99 mg/dL   Comment 1 Notify RN    Comment 2 Document in Chart   Glucose, capillary     Status: Abnormal   Collection Time: 06/12/16  6:33 PM  Result Value Ref Range   Glucose-Capillary 171 (H) 65 - 99 mg/dL  Glucose, capillary     Status: Abnormal   Collection Time: 06/12/16  9:15 PM  Result Value Ref Range   Glucose-Capillary 169 (H) 65 - 99 mg/dL  Glucose, capillary     Status: Abnormal   Collection Time: 06/13/16  8:31 AM  Result Value Ref Range   Glucose-Capillary 121 (H) 65 - 99 mg/dL   Comment 1 Notify RN   Glucose, capillary     Status: Abnormal   Collection Time: 06/13/16 11:41 AM  Result Value Ref Range   Glucose-Capillary 117 (H) 65 - 99 mg/dL   Comment 1 Notify RN      Lipid Panel  No results found for: CHOL, TRIG, HDL, CHOLHDL, VLDL, LDLCALC, LDLDIRECT   Lab Results  Component Value Date   HGBA1C 7.0 (H) 06/11/2016      HPI :*   81 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 History of tick exposure (a tic was removed from left upper medial thigh in the hospital) & mosquito bites. No cough, dyspnea, dysuria, urinary frequency, arthralgias/arthritis or skin rash. Admitted for sepsis evaluation and management. Infectious disease  consulted  HOSPITAL COURSE:    1. Probable Sepsis due to viral syndrome: Continues to have occasional fever. Tick borne illness versus other causes related to his occupation of wildlife exposure. Continue doxycycline per infectious disease. UA, chest x-ray, flu panel PCR, RSV negative. CSF not indicative of acute bacterial meningitis, cryptococcal antigen negative. Pro-calcitonin elevated at 0.39. Mildly elevated lactate has normalized. CSF culture, urine culture, blood cultures 2: Pending. CSF VDRL negative, arbovirus pending, HSV PCR negative. RMSF and Borrelia serology also negative except for positive [facility] spotted fever IgG. CT head without acute findings. Initially treated with IV fluids and started empirically on IV vancomycin, Zosyn, acyclovir and doxycycline. Clinically somewhat better. Infectious disease consulted for assistance. ID  DC'd  acyclovir, vancomycin and Zosyn. Discussed with infectious disease Dr. Scharlene Gloss . Fever has abated . No fever since 6 PM yesterday. Okay to discharge from ID standpoint on doxycycline for 7 days  2. Confusion/suspected encephalitis-symptoms resolved, symptoms does not indicate bacterial meningitis.   Per ID recommendations,acyclovir  now discontinued 3. Mild thrombocytopenia: Possibly related to problem #1. No bleeding reported. Follow CBCs. 4. Dehydration with hyponatremia: Secondary to fevers, sepsis, GI losses and poor oral intake. Brief IV fluids. Encourage oral intake. Follow BMP in a.m. 5. Essential hypertension: Controlled. Benazepril   6. DM type II:  A1c 7.0 . Continue SSI.  Oral hypoglycemics on hold 7. Hyperlipidemia: Continue statins. 8. GERD: Asymptomatic. Continue Protonix.  9. Tick bite: A small tick (? Seed tick) removed from patient's left upper thigh. Discussion as above. 10. Hypokalemia-repleted    Discharge Exam:   Blood pressure (!) 151/85, pulse 78, temperature 98.8 F (37.1 C), temperature source Oral, resp. rate  18, height _0  (1.753 m), weight 78 kg (172 lb), SpO2 100 %.  Cardiovascular system: S1 & S2 heard, RRR. No JVD, murmurs, rubs, gallops or clicks. No pedal edema. Telemetry personally reviewed: SB in the 50s-SR in the 60s. Gastrointestinal system: Abdomen is nondistended, soft and nontender. No organomegaly or masses felt. Normal bowel sounds heard. Central nervous system: Alert and oriented. No focal neurological deficits. Extremities: Symmetric 5 x 5 power. Skin: No rashes, lesions or ulcers Psychiatry: Judgement and insight appear normal. Mood & affect appropriate.      Follow-up Information    [name], MD. Call.   Specialty:  Internal Medicine Why:  Hospital follow-up in 3-5 days Contact information: [place] [address] [phone]           Signed: Reyne Dumas 06/13/2016, 12:25 PM        Time spent >1 hour

## 2016-06-13 NOTE — Progress Notes (Signed)
Pt temp 103.3, other VS are WDL, headache intermittent throbbing 10/10. Tylenol given, ice pack applied to groin and axilla. Baltazar Najjar, NP notified.

## 2016-06-13 NOTE — Progress Notes (Signed)
Came to see patient to discuss discharge planning. He is on day 4 of Doxycycline treatment for suspected RMSF. He is concerned about the pain he is experiencing in his scalp and down his face. Reports it feels like shingles and describes it to be shooting pains that are intermittent but worsened by touching his scalp. Ibuprofen and tylenol have been ineffective for his pain control. With the pain like it is he feels like he will end up in the ED tomorrow if he goes home today.   Discussed recent chest xray, head CT, LP and lab results with him and his wife today. Discussed his RMSF IgG +, IgM - at this time. With his occupation he could have had exposure in the past with recent exposure too soon to detect IgM. Consistent with viral process. Would continue treatment with Doxycycline as RMSF is certainly likely. His fevers are lessened, his arthralgia/myalgia is improved and his nausea is improved.   Primary team started on gabapentin 300 mg BID, first dose about 50 min prior to our discussion. Hopeful this will help with his symptom control.   Janene Madeira, MSN, NP-C [facility] [place] Cell: [phone] Pager: [phone]  06/13/2016 4:34 PM

## 2016-06-14 LAB — GLUCOSE, CAPILLARY
GLUCOSE-CAPILLARY: 142 mg/dL — AB (ref 65–99)
GLUCOSE-CAPILLARY: 181 mg/dL — AB (ref 65–99)
Glucose-Capillary: 225 mg/dL — ABNORMAL HIGH (ref 65–99)
Glucose-Capillary: 184 mg/dL — ABNORMAL HIGH (ref 65–99)

## 2016-06-14 LAB — CSF CULTURE: Culture: NO GROWTH

## 2016-06-14 LAB — CSF CULTURE W GRAM STAIN

## 2016-06-14 MED ORDER — AZITHROMYCIN 500 MG PO TABS
500.0000 mg | ORAL_TABLET | Freq: Every day | ORAL | Status: DC
  Administered 2016-06-14 – 2016-06-15 (×2): 500 mg via ORAL
  Filled 2016-06-14 (×2): qty 1

## 2016-06-14 MED ORDER — DOXYCYCLINE HYCLATE 100 MG PO TABS
100.0000 mg | ORAL_TABLET | Freq: Two times a day (BID) | ORAL | Status: DC
  Administered 2016-06-14 – 2016-06-15 (×2): 100 mg via ORAL
  Filled 2016-06-14 (×2): qty 1

## 2016-06-14 NOTE — Care Management Important Message (Signed)
Important Message  Patient Details  Name: Mark Bowen MRN: [id number] Date of Birth: Dec 17, 1935   Medicare Important Message Given:  Yes    Nathen May 06/14/2016, 12:11 PM

## 2016-06-14 NOTE — Progress Notes (Signed)
PROGRESS NOTE   Mark Bowen  KXF:[id number]    DOB: 02-08-1935    DOA: 06/10/2016  PCP: [name], MD   I have briefly reviewed patients previous medical records in [facility].  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 History of tick exposure (a tic was removed from left upper medial thigh in the hospital) & mosquito bites. No cough, dyspnea, dysuria, urinary frequency, arthralgias/arthritis or skin rash. Admitted for sepsis evaluation and management. Infectious disease consulted.   Assessment & Plan:   Principal Problem:   Sepsis ([place]) Active Problems:   Essential hypertension   GERD   HLD (hyperlipidemia)   Diabetes mellitus without complication ([place])   1. Probable Sepsis due to viral syndrome: Continues to have occasional fever.Tick borne illness versus other causes related to his occupation of wildlife exposure.Continue doxycycline per infectious disease.UA, chest x-ray, flu panel PCR, RSV negative. CSF not indicative of acute bacterial meningitis, cryptococcal antigen negative. Pro-calcitonin elevated at 0.39. Mildly elevated lactate has normalized. CSF culture, urine culture, blood cultures 2: Pending. CSF VDRL negative, arbovirus pending, HSV PCR negative. RMSF and Borrelia serology also negative except for positive [facility] spotted fever IgG. CT head without acute findings. Initially treated with IV fluids and started empirically on IV vancomycin, Zosyn, acyclovir and doxycycline. Clinically somewhat better. Infectious disease consulted for assistance. ID DC'd  acyclovir, vancomycin and Zosyn. Discussed with infectious disease Dr. Scharlene Gloss .   Discussed with the patient's wife and daughter regarding x-ray, CT, imaging studies, results of LP/labs. Conveyed that infectious disease would like patient to continue on doxycycline for 1 more week. Change doxycycline to oral. Patient has also developed a cough today and chest x-ray is consistent with acute bronchitis. Started patient on azithromycin 500 mg per day 5 days. 2. Scalp tenderness/headache-started patient on gabapentin 300 mg twice a day. Headache is improved 3. Confusion/suspected encephalitis-symptoms resolved, symptoms does not indicate bacterial meningitis.  Per ID recommendations,acyclovir  now discontinued 4. Mild thrombocytopenia:Possibly related to problem #1. No bleeding reported. Follow CBCs. 5. Dehydration with hyponatremia:Secondary to fevers, sepsis, GI losses and poor oral intake. Brief IV fluids. Encourage oral intake. Follow BMP in a.m. 6. Essential hypertension: Controlled. Benazepril   7. DM type II: A1c 7.0 . Continue SSI. Oral hypoglycemics on hold 8. Hyperlipidemia:Continue statins. 9. GERD:Asymptomatic. Continue Protonix.  10. Tick bite:A small tick (? Seed tick) removed from patient's left upper thigh. Discussion as above. 11. Hypokalemia-repleted, recheck labs    DVT prophylaxis: SCDs Code Status: Full Family Communication: Discussed in detail with daughter at bedside. Updated care and answered questions. Disposition: DC home likely tomorrow   Consultants:  Infectious disease   Procedures:  Lumbar puncture  Antimicrobials:  IV Zosyn IV vancomycin IV doxycycline IV acyclovir    Subjective: Patient states his headaches are better. Fever of 103 yesterday evening at 8 PM  Objective:  Vitals:   06/13/16 2036 06/13/16 2141 06/13/16 2253 06/14/16 0544  BP:  133/73  132/68  Pulse:  71  68  Resp:  18  18  Temp: 100.2 F (37.9 C) 99.8 F (37.7 C) 99.6 F (37.6 C) 98.6 F (37 C)  TempSrc: Axillary Oral Oral  Oral  SpO2:  95%  94%    Weight:      Height:        Examination:  General exam: Pleasant elderly male, moderately built and nourished, lying comfortably supine in bed. Does not look septic or toxic currently. Respiratory system: Clear to auscultation. Respiratory effort normal. Cardiovascular system: S1 & S2 heard, RRR. No JVD, murmurs, rubs, gallops or clicks. No pedal edema. Telemetry personally reviewed: SB in the 50s-SR in the 60s. Gastrointestinal system: Abdomen is nondistended, soft and nontender. No organomegaly or masses felt. Normal bowel sounds heard. Central nervous system: Alert and oriented. No focal neurological deficits. Extremities: Symmetric 5 x 5 power. Skin: No rashes, lesions or ulcers Psychiatry: Judgement and insight appear normal. Mood & affect appropriate.     Data Reviewed: I have personally reviewed following labs and imaging studies  CBC:  Recent Labs Lab 06/10/16 1451 06/11/16 0120 06/12/16 0529  WBC 8.9 6.8 4.1  NEUTROABS 7.4  --  3.1  HGB 15.8 12.8* 11.9*  HCT 45.0 37.4* 35.6*  MCV 86.4 87.0 85.4  PLT 159 137* 878*   Basic Metabolic Panel:  Recent Labs Lab 06/10/16 1451 06/11/16 0120 06/12/16 0529  NA 132* 132* 133*  K 3.5 3.6 3.3*  CL 98* 101 102  CO2 23 23 21*  GLUCOSE 201* 156* 154*  BUN 11 11 8   CREATININE 1.13 1.02 1.00  CALCIUM 8.7* 7.8* 7.6*   Liver Function Tests:  Recent Labs Lab 06/10/16 1451  AST 24  ALT 17  ALKPHOS 74  BILITOT 1.0  PROT 6.9  ALBUMIN 3.5   Coagulation Profile:  Recent Labs Lab 06/11/16 0120  INR 1.18   CBG:  Recent Labs Lab 06/13/16 0831 06/13/16 1141 06/13/16 1751 06/13/16 2140 06/14/16 0758  GLUCAP 121* 117* 156* 179* 142*    Recent Results (from the past 240 hour(s))  Culture, blood (Routine x 2)     Status: None (Preliminary result)   Collection Time: 06/10/16  3:37 PM  Result Value Ref Range Status   Specimen Description BLOOD LEFT ANTECUBITAL  Final   Special Requests   Final    BOTTLES  DRAWN AEROBIC AND ANAEROBIC Blood Culture adequate volume   Culture NO GROWTH 3 DAYS  Final   Report Status PENDING  Incomplete  Urine culture     Status: Abnormal   Collection Time: 06/10/16  3:38 PM  Result Value Ref Range Status   Specimen Description URINE, RANDOM  Final   Special Requests NONE  Final   Culture <10,000 COLONIES/mL INSIGNIFICANT GROWTH (A)  Final   Report Status 06/12/2016 FINAL  Final  Culture, blood (Routine x 2)     Status: None (Preliminary result)   Collection Time: 06/10/16  3:51 PM  Result Value Ref Range Status   Specimen Description BLOOD RIGHT WRIST  Final   Special Requests   Final    BOTTLES DRAWN AEROBIC AND ANAEROBIC Blood Culture adequate volume   Culture NO GROWTH 3 DAYS  Final   Report Status PENDING  Incomplete  Respiratory Panel by PCR     Status: None   Collection Time: 06/10/16  5:11 PM  Result Value Ref Range Status   Adenovirus NOT DETECTED NOT DETECTED Final   Coronavirus 229E NOT DETECTED NOT DETECTED Final   Coronavirus HKU1 NOT DETECTED NOT DETECTED Final   Coronavirus NL63 NOT DETECTED NOT DETECTED Final   Coronavirus OC43 NOT DETECTED NOT DETECTED Final   Metapneumovirus NOT DETECTED NOT DETECTED Final  Rhinovirus / Enterovirus NOT DETECTED NOT DETECTED Final   Influenza A NOT DETECTED NOT DETECTED Final   Influenza B NOT DETECTED NOT DETECTED Final   Parainfluenza Virus 1 NOT DETECTED NOT DETECTED Final   Parainfluenza Virus 2 NOT DETECTED NOT DETECTED Final   Parainfluenza Virus 3 NOT DETECTED NOT DETECTED Final   Parainfluenza Virus 4 NOT DETECTED NOT DETECTED Final   Respiratory Syncytial Virus NOT DETECTED NOT DETECTED Final   Bordetella pertussis NOT DETECTED NOT DETECTED Final   Chlamydophila pneumoniae NOT DETECTED NOT DETECTED Final   Mycoplasma pneumoniae NOT DETECTED NOT DETECTED Final  CSF culture     Status: None (Preliminary result)   Collection Time: 06/10/16  9:24 PM  Result Value Ref Range Status   Specimen  Description CSF  Final   Special Requests NO 2  Final   Gram Stain   Final    CYTOSPIN SMEAR WBC PRESENT, PREDOMINANTLY MONONUCLEAR NO ORGANISMS SEEN    Culture NO GROWTH 3 DAYS  Final   Report Status PENDING  Incomplete  Hsv Culture And Typing     Status: None   Collection Time: 06/10/16  9:24 PM  Result Value Ref Range Status   HSV Culture/Type Comment  Final    Comment: (NOTE) Negative No Herpes simplex virus isolated. Performed At: [facility] [place], [place] [id number] Lindon Romp MD BH:[id number]    Source of Sample CSF  Final  Culture, blood (Routine X 2) w Reflex to ID Panel     Status: None (Preliminary result)   Collection Time: 06/12/16  3:31 PM  Result Value Ref Range Status   Specimen Description BLOOD LEFT ARM  Final   Special Requests   Final    BOTTLES DRAWN AEROBIC AND ANAEROBIC Blood Culture adequate volume   Culture NO GROWTH < 24 HOURS  Final   Report Status PENDING  Incomplete  Culture, blood (Routine X 2) w Reflex to ID Panel     Status: None (Preliminary result)   Collection Time: 06/12/16  3:36 PM  Result Value Ref Range Status   Specimen Description BLOOD RIGHT HAND  Final   Special Requests IN PEDIATRIC BOTTLE Blood Culture adequate volume  Final   Culture NO GROWTH < 24 HOURS  Final   Report Status PENDING  Incomplete         Radiology Studies: Mr Brain 36 Contrast  Result Date: 06/13/2016 CLINICAL DATA:  Headache EXAM: MRI HEAD WITHOUT CONTRAST TECHNIQUE: Multiplanar, multiecho pulse sequences of the brain and surrounding structures were obtained without intravenous contrast. COMPARISON:  Head CT 06/10/2016 FINDINGS: Brain: The midline structures are normal. There is no focal diffusion restriction to indicate acute infarct. There is mild hyperintense T2-weighted signal within the periventricular white matter, most often seen in the setting of chronic microvascular ischemia. No intraparenchymal hematoma or chronic  microhemorrhage. There is moderate diffuse atrophy. The dura is normal and there is no extra-axial collection. Vascular: Major intracranial arterial and venous sinus flow voids are preserved. Skull and upper cervical spine: The visualized skull base, calvarium, upper cervical spine and extracranial soft tissues are normal. Sinuses/Orbits: No fluid levels or advanced mucosal thickening. No mastoid effusion. Normal orbits. IMPRESSION: Moderate atrophy and mild chronic microvascular ischemia for age. No acute intracranial abnormality. Electronically Signed   By: Ulyses Jarred M.D.   On: 06/13/2016 19:18   Dg Chest Port 1 View  Result Date: 06/13/2016 CLINICAL DATA:  81 year old presenting with acute onset of cough. Former smoker.  Current history of diabetes and hypertension. EXAM: PORTABLE CHEST 1 VIEW COMPARISON:  06/10/2016, 07/08/2006, 07/03/2006 and CT chest 07/23/2006. FINDINGS: Cardiac silhouette normal in size for AP portable technique, unchanged. Thoracic aorta atherosclerotic, unchanged. Hilar and mediastinal contours otherwise unremarkable. Pulmonary venous hypertension. Prominent bronchovascular markings diffusely with central peribronchial thickening, new since the examination 3 days ago. No confluent airspace consolidation. IMPRESSION: Moderate changes of acute bronchitis and/or asthma without focal airspace pneumonia (favored over CHF given the normal heart size). Electronically Signed   By: Evangeline Dakin M.D.   On: 06/13/2016 13:59        Scheduled Meds: . aspirin  81 mg Oral Daily  . azithromycin  500 mg Oral Daily  . doxycycline  100 mg Oral Q12H  . gabapentin  300 mg Oral BID  . insulin aspart  0-5 Units Subcutaneous QHS  . insulin aspart  0-9 Units Subcutaneous TID WC  . omega-3 acid ethyl esters  1 g Oral Daily  . pantoprazole  40 mg Oral Daily  . simvastatin  20 mg Oral q1800  . sodium chloride flush  3 mL Intravenous Q12H   Continuous Infusions: . 0.9 % NaCl with KCl 20  mEq / L 75 mL/hr at 06/13/16 1800     LOS: 4 days     Reyne Dumas, MD,  [facility] Pager [phone]  If 7PM-7AM, please contact night-coverage [email or web address] Password [email or web address] 06/14/2016, 10:11 AM

## 2016-06-15 LAB — COMPREHENSIVE METABOLIC PANEL
ALT: 50 U/L (ref 17–63)
ANION GAP: 7 (ref 5–15)
AST: 44 U/L — ABNORMAL HIGH (ref 15–41)
Albumin: 2.2 g/dL — ABNORMAL LOW (ref 3.5–5.0)
Alkaline Phosphatase: 83 U/L (ref 38–126)
BUN: 5 mg/dL — ABNORMAL LOW (ref 6–20)
CHLORIDE: 104 mmol/L (ref 101–111)
CO2: 24 mmol/L (ref 22–32)
Calcium: 8 mg/dL — ABNORMAL LOW (ref 8.9–10.3)
Creatinine, Ser: 0.78 mg/dL (ref 0.61–1.24)
GFR calc Af Amer: >60 mL/min (ref >60)
Glucose, Bld: 151 mg/dL — ABNORMAL HIGH (ref 65–99)
POTASSIUM: 3.8 mmol/L (ref 3.5–5.1)
Sodium: 135 mmol/L (ref 135–145)
TOTAL PROTEIN: 4.8 g/dL — AB (ref 6.5–8.1)
Total Bilirubin: 0.8 mg/dL (ref 0.3–1.2)

## 2016-06-15 LAB — CULTURE, BLOOD (ROUTINE X 2)
CULTURE: NO GROWTH
Culture: NO GROWTH
Special Requests: ADEQUATE
Special Requests: ADEQUATE

## 2016-06-15 LAB — CBC
HEMATOCRIT: 36 % — AB (ref 39.0–52.0)
HEMOGLOBIN: 12 g/dL — AB (ref 13.0–17.0)
MCH: 29 pg (ref 26.0–34.0)
MCHC: 33.3 g/dL (ref 30.0–36.0)
MCV: 87 fL (ref 78.0–100.0)
Platelets: 190 10*3/uL (ref 150–400)
RBC: 4.14 MIL/uL — AB (ref 4.22–5.81)
RDW: 13.7 % (ref 11.5–15.5)
WBC: 3.5 10*3/uL — AB (ref 4.0–10.5)

## 2016-06-15 LAB — GLUCOSE, CAPILLARY
GLUCOSE-CAPILLARY: 184 mg/dL — AB (ref 65–99)
Glucose-Capillary: 121 mg/dL — ABNORMAL HIGH (ref 65–99)

## 2016-06-15 MED ORDER — GABAPENTIN 600 MG PO TABS: 300.0000 mg | ORAL_TABLET | Freq: Two times a day (BID) | ORAL | 0 refills | Status: DC

## 2016-06-15 MED ORDER — AZITHROMYCIN 500 MG PO TABS: 500.0000 mg | ORAL_TABLET | Freq: Every day | ORAL | 0 refills | Status: AC

## 2016-06-15 NOTE — Progress Notes (Signed)
Mark Bowen to be D/C'd Home per MD order.  Discussed with the patient and all questions fully answered.  VSS, Skin clean, dry and intact without evidence of skin break down, no evidence of skin tears noted. IV catheter discontinued intact. Site without signs and symptoms of complications. Dressing and pressure applied.  An After Visit Summary was printed and given to the patient.   D/c education completed with patient/family including follow up instructions, medication list, d/c activities limitations if indicated, with other d/c instructions as indicated by MD - patient able to verbalize understanding, all questions fully answered.   Patient instructed to return to ED, call 911, or call MD for any changes in condition.   Patient escorted via [place], and D/C home via private auto.  Karolee Ohs 06/15/2016 2:14 PM

## 2016-06-15 NOTE — Discharge Summary (Signed)
Physician Discharge Summary  Mark Bowen MRN: [id number] DOB/AGE: [birth year] 81 y.o.  PCP: [name], MD   Admit date: 06/10/2016 Discharge date: 06/15/2016  Discharge Diagnoses:    Principal Problem:   Sepsis [facility]) Active Problems:   Essential hypertension   GERD   HLD (hyperlipidemia)   Diabetes mellitus without complication ([place])    Follow-up recommendations Follow-up with PCP in 3-5 days , including all  additional recommended appointments as below Follow-up CBC, CMP in 3-5 days Patient requested to notify Dr.Comer, if he continues to spike high-grade fevers after completion of antibiotic therapy      Current Discharge Medication List    START taking these medications   Details  acetaminophen (TYLENOL) 325 MG tablet Take 2 tablets (650 mg total) by mouth every 6 (six) hours as needed for mild pain, fever or headache. Qty: 30 tablet, Refills: 2    azithromycin (ZITHROMAX) 500 MG tablet Take 1 tablet (500 mg total) by mouth daily. Qty: 5 tablet, Refills: 0    doxycycline (VIBRAMYCIN) 50 MG capsule Take 2 capsules (100 mg total) by mouth 2 (two) times daily. Qty: 40 capsule, Refills: 0    gabapentin (NEURONTIN) 600 MG tablet Take 0.5 tablets (300 mg total) by mouth 2 (two) times daily. Qty: 30 tablet, Refills: 0      CONTINUE these medications which have CHANGED   Details  benazepril (LOTENSIN) 20 MG tablet Take 1 tablet (20 mg total) by mouth daily. Qty: 30 tablet, Refills: 1      CONTINUE these medications which have NOT CHANGED   Details  aspirin 81 MG tablet Take 81 mg by mouth daily.    fish oil-omega-3 fatty acids 1000 MG capsule Take 1 g by mouth daily.     omeprazole (PRILOSEC) 20 MG capsule Take 20 mg by mouth daily.    simvastatin (ZOCOR) 40 MG tablet Take 20 mg by mouth every morning.    sitaGLIPtan-metformin (JANUMET) 50-1000 MG per tablet Take 1 tablet by mouth 2 (two) times daily with a meal.         Discharge Condition:     Discharge Instructions Get Medicines reviewed and adjusted: Please take all your medications with you for your next visit with your Primary MD  Please request your Primary MD to go over all hospital tests and procedure/radiological results at the follow up, please ask your Primary MD to get all Hospital records sent to his/her office.  If you experience worsening of your admission symptoms, develop shortness of breath, life threatening emergency, suicidal or homicidal thoughts you must seek medical attention immediately by calling 911 or calling your MD immediately if symptoms less severe.  You must read complete instructions/literature along with all the possible adverse reactions/side effects for all the Medicines you take and that have been prescribed to you. Take any new Medicines after you have completely understood and accpet all the possible adverse reactions/side effects.   Do not drive when taking Pain medications.   Do not take more than prescribed Pain, Sleep and Anxiety Medications  Special Instructions: If you have smoked or chewed Tobacco in the last 2 yrs please stop smoking, stop any regular Alcohol and or any Recreational drug use.  Wear Seat belts while driving.  Please note  You were cared for by a hospitalist during your hospital stay. Once you are discharged, your primary care physician will handle any further medical issues. Please note that NO REFILLS for any discharge medications will be authorized once you  are discharged, as it is imperative that you return to your primary care physician (or establish a relationship with a primary care physician if you do not have one) for your aftercare needs so that they can reassess your need for medications and monitor your lab values.     Allergies  Allergen Reactions  . Iodine   . Iohexol      Code: HIVES, Desc: pt. states he breaks out in hives with iv dye       Disposition:  home   Consults: Infectious  disease    Significant Diagnostic Studies:  Dg Chest 2 View  Result Date: 06/10/2016 CLINICAL DATA:  Fever EXAM: CHEST  2 VIEW COMPARISON:  07/08/2006 FINDINGS: Low lung volumes. Heart size is accentuated by the AP nature of the study and the low lung volumes, felt to be within normal limits. No confluent opacities or effusions. No acute bony abnormality. IMPRESSION: No active cardiopulmonary disease. Electronically Signed   By: Rolm Baptise M.D.   On: 06/10/2016 18:41   Ct Head Wo Contrast  Result Date: 06/10/2016 CLINICAL DATA:  Headaches EXAM: CT HEAD WITHOUT CONTRAST TECHNIQUE: Contiguous axial images were obtained from the base of the skull through the vertex without intravenous contrast. COMPARISON:  None. FINDINGS: Brain: Diffuse atrophic changes are noted. No findings to suggest acute hemorrhage, acute infarction or space-occupying mass lesion are noted. Vascular: No hyperdense vessel or unexpected calcification. Skull: Normal. Negative for fracture or focal lesion. Sinuses/Orbits: No acute finding. Other: None IMPRESSION: Diffuse atrophic changes.  No acute abnormality is noted. Electronically Signed   By: Inez Catalina M.D.   On: 06/10/2016 20:31   Mr Brain Wo Contrast  Result Date: 06/13/2016 CLINICAL DATA:  Headache EXAM: MRI HEAD WITHOUT CONTRAST TECHNIQUE: Multiplanar, multiecho pulse sequences of the brain and surrounding structures were obtained without intravenous contrast. COMPARISON:  Head CT 06/10/2016 FINDINGS: Brain: The midline structures are normal. There is no focal diffusion restriction to indicate acute infarct. There is mild hyperintense T2-weighted signal within the periventricular white matter, most often seen in the setting of chronic microvascular ischemia. No intraparenchymal hematoma or chronic microhemorrhage. There is moderate diffuse atrophy. The dura is normal and there is no extra-axial collection. Vascular: Major intracranial arterial and venous sinus flow voids  are preserved. Skull and upper cervical spine: The visualized skull base, calvarium, upper cervical spine and extracranial soft tissues are normal. Sinuses/Orbits: No fluid levels or advanced mucosal thickening. No mastoid effusion. Normal orbits. IMPRESSION: Moderate atrophy and mild chronic microvascular ischemia for age. No acute intracranial abnormality. Electronically Signed   By: Ulyses Jarred M.D.   On: 06/13/2016 19:18   Dg Chest Port 1 View  Result Date: 06/13/2016 CLINICAL DATA:  81 year old presenting with acute onset of cough. Former smoker. Current history of diabetes and hypertension. EXAM: PORTABLE CHEST 1 VIEW COMPARISON:  06/10/2016, 07/08/2006, 07/03/2006 and CT chest 07/23/2006. FINDINGS: Cardiac silhouette normal in size for AP portable technique, unchanged. Thoracic aorta atherosclerotic, unchanged. Hilar and mediastinal contours otherwise unremarkable. Pulmonary venous hypertension. Prominent bronchovascular markings diffusely with central peribronchial thickening, new since the examination 3 days ago. No confluent airspace consolidation. IMPRESSION: Moderate changes of acute bronchitis and/or asthma without focal airspace pneumonia (favored over CHF given the normal heart size). Electronically Signed   By: Evangeline Dakin M.D.   On: 06/13/2016 13:59        Filed Weights   06/10/16 1543  Weight: 78 kg (172 lb)     Microbiology: Recent Results (from  the past 240 hour(s))  Culture, blood (Routine x 2)     Status: None (Preliminary result)   Collection Time: 06/10/16  3:37 PM  Result Value Ref Range Status   Specimen Description BLOOD LEFT ANTECUBITAL  Final   Special Requests   Final    BOTTLES DRAWN AEROBIC AND ANAEROBIC Blood Culture adequate volume   Culture NO GROWTH 4 DAYS  Final   Report Status PENDING  Incomplete  Urine culture     Status: Abnormal   Collection Time: 06/10/16  3:38 PM  Result Value Ref Range Status   Specimen Description URINE, RANDOM  Final    Special Requests NONE  Final   Culture <10,000 COLONIES/mL INSIGNIFICANT GROWTH (A)  Final   Report Status 06/12/2016 FINAL  Final  Culture, blood (Routine x 2)     Status: None (Preliminary result)   Collection Time: 06/10/16  3:51 PM  Result Value Ref Range Status   Specimen Description BLOOD RIGHT WRIST  Final   Special Requests   Final    BOTTLES DRAWN AEROBIC AND ANAEROBIC Blood Culture adequate volume   Culture NO GROWTH 4 DAYS  Final   Report Status PENDING  Incomplete  Respiratory Panel by PCR     Status: None   Collection Time: 06/10/16  5:11 PM  Result Value Ref Range Status   Adenovirus NOT DETECTED NOT DETECTED Final   Coronavirus 229E NOT DETECTED NOT DETECTED Final   Coronavirus HKU1 NOT DETECTED NOT DETECTED Final   Coronavirus NL63 NOT DETECTED NOT DETECTED Final   Coronavirus OC43 NOT DETECTED NOT DETECTED Final   Metapneumovirus NOT DETECTED NOT DETECTED Final   Rhinovirus / Enterovirus NOT DETECTED NOT DETECTED Final   Influenza A NOT DETECTED NOT DETECTED Final   Influenza B NOT DETECTED NOT DETECTED Final   Parainfluenza Virus 1 NOT DETECTED NOT DETECTED Final   Parainfluenza Virus 2 NOT DETECTED NOT DETECTED Final   Parainfluenza Virus 3 NOT DETECTED NOT DETECTED Final   Parainfluenza Virus 4 NOT DETECTED NOT DETECTED Final   Respiratory Syncytial Virus NOT DETECTED NOT DETECTED Final   Bordetella pertussis NOT DETECTED NOT DETECTED Final   Chlamydophila pneumoniae NOT DETECTED NOT DETECTED Final   Mycoplasma pneumoniae NOT DETECTED NOT DETECTED Final  CSF culture     Status: None   Collection Time: 06/10/16  9:24 PM  Result Value Ref Range Status   Specimen Description CSF  Final   Special Requests NO 2  Final   Gram Stain   Final    CYTOSPIN SMEAR WBC PRESENT, PREDOMINANTLY MONONUCLEAR NO ORGANISMS SEEN    Culture NO GROWTH 3 DAYS  Final   Report Status 06/14/2016 FINAL  Final  Hsv Culture And Typing     Status: None   Collection Time: 06/10/16  9:24  PM  Result Value Ref Range Status   HSV Culture/Type Comment  Final    Comment: (NOTE) Negative No Herpes simplex virus isolated. Performed At: [facility] [place], [place] [id number] Lindon Romp MD EX:[id number]    Source of Sample CSF  Final  Culture, blood (Routine X 2) w Reflex to ID Panel     Status: None (Preliminary result)   Collection Time: 06/12/16  3:31 PM  Result Value Ref Range Status   Specimen Description BLOOD LEFT ARM  Final   Special Requests   Final    BOTTLES DRAWN AEROBIC AND ANAEROBIC Blood Culture adequate volume   Culture NO GROWTH 2 DAYS  Final   Report Status PENDING  Incomplete  Culture, blood (Routine X 2) w Reflex to ID Panel     Status: None (Preliminary result)   Collection Time: 06/12/16  3:36 PM  Result Value Ref Range Status   Specimen Description BLOOD RIGHT HAND  Final   Special Requests IN PEDIATRIC BOTTLE Blood Culture adequate volume  Final   Culture NO GROWTH 2 DAYS  Final   Report Status PENDING  Incomplete       Blood Culture    Component Value Date/Time   SDES BLOOD RIGHT HAND 06/12/2016 1536   SPECREQUEST IN PEDIATRIC BOTTLE Blood Culture adequate volume 06/12/2016 1536   CULT NO GROWTH 2 DAYS 06/12/2016 1536   REPTSTATUS PENDING 06/12/2016 1536      Labs: Results for orders placed or performed during the hospital encounter of 06/10/16 (from the past 48 hour(s))  Glucose, capillary     Status: Abnormal   Collection Time: 06/13/16  8:31 AM  Result Value Ref Range   Glucose-Capillary 121 (H) 65 - 99 mg/dL   Comment 1 Notify RN   Glucose, capillary     Status: Abnormal   Collection Time: 06/13/16 11:41 AM  Result Value Ref Range   Glucose-Capillary 117 (H) 65 - 99 mg/dL   Comment 1 Notify RN   Glucose, capillary     Status: Abnormal   Collection Time: 06/13/16  5:51 PM  Result Value Ref Range   Glucose-Capillary 156 (H) 65 - 99 mg/dL  Glucose, capillary     Status: Abnormal   Collection  Time: 06/13/16  9:40 PM  Result Value Ref Range   Glucose-Capillary 179 (H) 65 - 99 mg/dL  Glucose, capillary     Status: Abnormal   Collection Time: 06/14/16  7:58 AM  Result Value Ref Range   Glucose-Capillary 142 (H) 65 - 99 mg/dL   Comment 1 Document in Chart   Glucose, capillary     Status: Abnormal   Collection Time: 06/14/16 11:43 AM  Result Value Ref Range   Glucose-Capillary 225 (H) 65 - 99 mg/dL   Comment 1 Document in Chart   Glucose, capillary     Status: Abnormal   Collection Time: 06/14/16  5:13 PM  Result Value Ref Range   Glucose-Capillary 181 (H) 65 - 99 mg/dL  Glucose, capillary     Status: Abnormal   Collection Time: 06/14/16  9:20 PM  Result Value Ref Range   Glucose-Capillary 184 (H) 65 - 99 mg/dL   Comment 1 Notify RN    Comment 2 Document in Chart   Comprehensive metabolic panel     Status: Abnormal   Collection Time: 06/15/16  6:25 AM  Result Value Ref Range   Sodium 135 135 - 145 mmol/L   Potassium 3.8 3.5 - 5.1 mmol/L   Chloride 104 101 - 111 mmol/L   CO2 24 22 - 32 mmol/L   Glucose, Bld 151 (H) 65 - 99 mg/dL   BUN 5 (L) 6 - 20 mg/dL   Creatinine, Ser 0.78 0.61 - 1.24 mg/dL   Calcium 8.0 (L) 8.9 - 10.3 mg/dL   Total Protein 4.8 (L) 6.5 - 8.1 g/dL   Albumin 2.2 (L) 3.5 - 5.0 g/dL   AST 44 (H) 15 - 41 U/L   ALT 50 17 - 63 U/L   Alkaline Phosphatase 83 38 - 126 U/L   Total Bilirubin 0.8 0.3 - 1.2 mg/dL   GFR calc non Af Amer >60 >60 mL/min  GFR calc Af Amer >60 >60 mL/min    Comment: (NOTE) The eGFR has been calculated using the CKD EPI equation. This calculation has not been validated in all clinical situations. eGFR's persistently <60 mL/min signify possible Chronic Kidney Disease.    Anion gap 7 5 - 15  CBC     Status: Abnormal   Collection Time: 06/15/16  6:25 AM  Result Value Ref Range   WBC 3.5 (L) 4.0 - 10.5 K/uL   RBC 4.14 (L) 4.22 - 5.81 MIL/uL   Hemoglobin 12.0 (L) 13.0 - 17.0 g/dL   HCT 36.0 (L) 39.0 - 52.0 %   MCV 87.0 78.0 -  100.0 fL   MCH 29.0 26.0 - 34.0 pg   MCHC 33.3 30.0 - 36.0 g/dL   RDW 13.7 11.5 - 15.5 %   Platelets 190 150 - 400 K/uL     Lipid Panel  No results found for: CHOL, TRIG, HDL, CHOLHDL, VLDL, LDLCALC, LDLDIRECT   Lab Results  Component Value Date   HGBA1C 7.0 (H) 06/11/2016      HPI :  81 year old male, a wildlife damage control agent, PMH of DM 2, GERD, HLD, HTN, prostate cancer status post surgery, presented to the ED with 4 day history of fever, headaches, generalized body aches, nausea, vomiting and diarrhea. 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 History of tick exposure (a tic was removed from left upper medial thigh in the hospital) & mosquito bites. No cough, dyspnea, dysuria, urinary frequency, arthralgias/arthritis or skin rash. Admitted for FUO  evaluation and management. Infectious disease consulted.  HOSPITAL COURSE:   1. Probable Sepsisdue to viral syndrome/ suspected RMSF: Continues to have occasional fever but none in the last 24 hours.Tick borne illness versus other causes related to his occupation of wildlife exposure.Continue doxycycline per infectious disease.UA, chest x-ray, flu panel PCR, RSV negative. CSF not indicative of acute bacterial meningitis, cryptococcal antigen negative. Pro-calcitonin elevated at 0.39. Mildly elevated lactate has normalized. CSF culture, urine culture, blood cultures 2: Pending. CSF VDRL negative, arbovirus pending, HSV PCR negative. RMSF and Borrelia serology also negative except for positive [facility] spotted fever IgG. CT head without acute findings. Initially treated with IV fluids and started empirically on IV vancomycin, Zosyn, acyclovir and doxycycline. Clinically somewhat better. Infectious disease consulted for assistance. ID DC'd acyclovir, vancomycin and Zosyn. Discussed with infectious disease Dr. Scharlene Gloss .  Discussed with the patient's wife and  daughter regarding x-ray, CT, imaging studies, results of LP/labs. Conveyed that infectious disease would like patient to continue on doxycycline for 1 more week.  . Patient has also developed a cough 6/6 and chest x-ray is consistent with acute bronchitis. Started patient on azithromycin 500 mg per day 5 days. Fever curve has improved. Patient to continue with both antibiotics for 5 more days 2. Scalp tenderness/headache-started patient on gabapentin 300 mg twice a day. Headache is improved 3. Confusion/suspected encephalitis-symptoms resolved, symptomsdoes not indicate bacterial meningitis. Per ID recommendations,acyclovir now discontinued 4. Mild thrombocytopenia:Possibly related to problem #1. No bleeding reported. Follow CBCs. 5. Dehydration with hyponatremia:Secondary to fevers, sepsis, GI losses and poor oral intake. Brief IV fluids. Encourage oral intake.  6. Essential hypertension: Controlled. Benazepril  7. DM type II: A1c 7.0 . Continue SSI. Oral hypoglycemics on hold 8. Hyperlipidemia:Continue statins. 9. GERD:Asymptomatic. Continue Protonix.  10. Tick bite:A small deer  tick   removed from patient's left upper thigh. Discussion as  above. 11. Hypokalemia-repleted,     Discharge Exam:   Blood pressure (!) 147/87, pulse 69, temperature 98.8 F (37.1 C), temperature source Oral, resp. rate 18, height _0  (1.753 m), weight 78 kg (172 lb), SpO2 97 %.   General exam: Pleasant elderly male, moderately built and nourished, lying comfortably supine in bed. Does not look septic or toxic currently. Respiratory system: Clear to auscultation. Respiratory effort normal. Cardiovascular system: S1 & S2 heard, RRR. No JVD, murmurs, rubs, gallops or clicks. No pedal edema. Telemetry personally reviewed: SB in the 50s-SR in the 60s. Gastrointestinal system: Abdomen is nondistended, soft and nontender. No organomegaly or masses felt. Normal bowel sounds heard. Central nervous system:  Alert and oriented. No focal neurological deficits. Extremities: Symmetric 5 x 5 power. Skin: No rashes, lesions or ulcers Psychiatry: Judgement and insight appear normal. Mood & affect appropriate.     Follow-up Information    [name], MD. Call.   Specialty:  Internal Medicine Why:  Hospital follow-up in 3-5 days Contact information: [place] [address] [phone]        Thayer Headings, MD. Call.   Specialty:  Infectious Diseases Why:  Infectious disease if fever persists Contact information: [address] [place] [address] [phone]           Signed: Reyne Dumas 06/15/2016, 8:08 AM        Time spent >1 hour

## 2016-06-17 LAB — CULTURE, BLOOD (ROUTINE X 2)
CULTURE: NO GROWTH
CULTURE: NO GROWTH
SPECIAL REQUESTS: ADEQUATE
Special Requests: ADEQUATE

## 2016-06-21 DIAGNOSIS — W57XXXA Bitten or stung by nonvenomous insect and other nonvenomous arthropods, initial encounter: Secondary | ICD-10-CM | POA: Insufficient documentation

## 2016-06-21 DIAGNOSIS — D696 Thrombocytopenia, unspecified: Secondary | ICD-10-CM | POA: Insufficient documentation

## 2016-06-21 DIAGNOSIS — E871 Hypo-osmolality and hyponatremia: Secondary | ICD-10-CM | POA: Insufficient documentation

## 2016-06-21 DIAGNOSIS — G0490 Encephalitis and encephalomyelitis, unspecified: Secondary | ICD-10-CM | POA: Insufficient documentation

## 2016-06-21 DIAGNOSIS — E860 Dehydration: Secondary | ICD-10-CM | POA: Insufficient documentation

## 2016-06-21 LAB — ARBOVIRUS IGG, CSF: West Nile IgG CSF: 0.22 IV

## 2017-03-19 DIAGNOSIS — B353 Tinea pedis: Secondary | ICD-10-CM | POA: Insufficient documentation

## 2017-10-21 DIAGNOSIS — M79641 Pain in right hand: Secondary | ICD-10-CM | POA: Insufficient documentation

## 2017-10-21 DIAGNOSIS — M6530 Trigger finger, unspecified finger: Secondary | ICD-10-CM | POA: Insufficient documentation

## 2017-11-01 DIAGNOSIS — R52 Pain, unspecified: Secondary | ICD-10-CM | POA: Insufficient documentation

## 2017-11-02 ENCOUNTER — Other Ambulatory Visit: Payer: Self-pay | Admitting: Orthopedic Surgery

## 2017-11-02 DIAGNOSIS — R52 Pain, unspecified: Secondary | ICD-10-CM

## 2017-11-02 DIAGNOSIS — M2450 Contracture, unspecified joint: Secondary | ICD-10-CM

## 2017-11-13 ENCOUNTER — Other Ambulatory Visit: Payer: Medicare Other

## 2018-01-23 DIAGNOSIS — M79672 Pain in left foot: Secondary | ICD-10-CM | POA: Insufficient documentation

## 2018-09-05 DIAGNOSIS — G8929 Other chronic pain: Secondary | ICD-10-CM | POA: Insufficient documentation

## 2018-09-08 ENCOUNTER — Other Ambulatory Visit: Payer: Self-pay | Admitting: Orthopedic Surgery

## 2018-09-08 DIAGNOSIS — M2560 Stiffness of unspecified joint, not elsewhere classified: Secondary | ICD-10-CM

## 2018-09-08 DIAGNOSIS — R52 Pain, unspecified: Secondary | ICD-10-CM

## 2018-09-12 ENCOUNTER — Other Ambulatory Visit: Payer: Self-pay

## 2018-09-12 ENCOUNTER — Ambulatory Visit
Admission: RE | Admit: 2018-09-12 | Discharge: 2018-09-12 | Disposition: A | Discharge status: Home / Self Care | Payer: Medicare Other | Source: Ambulatory Visit | Attending: Orthopedic Surgery | Admitting: Orthopedic Surgery

## 2018-09-12 DIAGNOSIS — R52 Pain, unspecified: Secondary | ICD-10-CM

## 2018-09-12 DIAGNOSIS — M2560 Stiffness of unspecified joint, not elsewhere classified: Secondary | ICD-10-CM

## 2018-11-09 HISTORY — PX: KNEE SURGERY: SHX244

## 2018-11-13 DIAGNOSIS — M542 Cervicalgia: Secondary | ICD-10-CM | POA: Insufficient documentation

## 2018-12-09 DIAGNOSIS — M4802 Spinal stenosis, cervical region: Secondary | ICD-10-CM | POA: Insufficient documentation

## 2018-12-09 DIAGNOSIS — M5412 Radiculopathy, cervical region: Secondary | ICD-10-CM | POA: Insufficient documentation

## 2018-12-09 DIAGNOSIS — R030 Elevated blood-pressure reading, without diagnosis of hypertension: Secondary | ICD-10-CM | POA: Insufficient documentation

## 2019-01-09 DIAGNOSIS — K810 Acute cholecystitis: Secondary | ICD-10-CM

## 2019-01-09 HISTORY — DX: Acute cholecystitis: K81.0

## 2019-01-11 ENCOUNTER — Encounter (HOSPITAL_COMMUNITY): Payer: Self-pay

## 2019-01-11 ENCOUNTER — Inpatient Hospital Stay (HOSPITAL_COMMUNITY)
Admission: EM | Admit: 2019-01-11 | Discharge: 2019-01-16 | DRG: 417 | Disposition: A | Discharge status: Home / Self Care | Payer: Medicare Other | Attending: Surgery | Admitting: Surgery

## 2019-01-11 DIAGNOSIS — Z79899 Other long term (current) drug therapy: Secondary | ICD-10-CM

## 2019-01-11 DIAGNOSIS — E785 Hyperlipidemia, unspecified: Secondary | ICD-10-CM | POA: Diagnosis present

## 2019-01-11 DIAGNOSIS — E119 Type 2 diabetes mellitus without complications: Secondary | ICD-10-CM | POA: Diagnosis present

## 2019-01-11 DIAGNOSIS — Z86010 Personal history of colonic polyps: Secondary | ICD-10-CM

## 2019-01-11 DIAGNOSIS — K839 Disease of biliary tract, unspecified: Secondary | ICD-10-CM

## 2019-01-11 DIAGNOSIS — K859 Acute pancreatitis without necrosis or infection, unspecified: Secondary | ICD-10-CM | POA: Diagnosis not present

## 2019-01-11 DIAGNOSIS — K810 Acute cholecystitis: Secondary | ICD-10-CM | POA: Diagnosis present

## 2019-01-11 DIAGNOSIS — Z8042 Family history of malignant neoplasm of prostate: Secondary | ICD-10-CM

## 2019-01-11 DIAGNOSIS — K9189 Other postprocedural complications and disorders of digestive system: Secondary | ICD-10-CM | POA: Diagnosis not present

## 2019-01-11 DIAGNOSIS — E872 Acidosis: Secondary | ICD-10-CM | POA: Diagnosis present

## 2019-01-11 DIAGNOSIS — Z20822 Contact with and (suspected) exposure to covid-19: Secondary | ICD-10-CM | POA: Diagnosis present

## 2019-01-11 DIAGNOSIS — Z87891 Personal history of nicotine dependence: Secondary | ICD-10-CM

## 2019-01-11 DIAGNOSIS — Z9049 Acquired absence of other specified parts of digestive tract: Secondary | ICD-10-CM

## 2019-01-11 DIAGNOSIS — Z7984 Long term (current) use of oral hypoglycemic drugs: Secondary | ICD-10-CM

## 2019-01-11 DIAGNOSIS — K82A1 Gangrene of gallbladder in cholecystitis: Secondary | ICD-10-CM | POA: Diagnosis present

## 2019-01-11 DIAGNOSIS — Z8546 Personal history of malignant neoplasm of prostate: Secondary | ICD-10-CM

## 2019-01-11 DIAGNOSIS — K219 Gastro-esophageal reflux disease without esophagitis: Secondary | ICD-10-CM | POA: Diagnosis present

## 2019-01-11 DIAGNOSIS — K4020 Bilateral inguinal hernia, without obstruction or gangrene, not specified as recurrent: Secondary | ICD-10-CM | POA: Diagnosis present

## 2019-01-11 DIAGNOSIS — Z801 Family history of malignant neoplasm of trachea, bronchus and lung: Secondary | ICD-10-CM

## 2019-01-11 DIAGNOSIS — K812 Acute cholecystitis with chronic cholecystitis: Secondary | ICD-10-CM | POA: Diagnosis not present

## 2019-01-11 DIAGNOSIS — Z7982 Long term (current) use of aspirin: Secondary | ICD-10-CM

## 2019-01-11 DIAGNOSIS — E7800 Pure hypercholesterolemia, unspecified: Secondary | ICD-10-CM | POA: Diagnosis present

## 2019-01-11 DIAGNOSIS — Y838 Other surgical procedures as the cause of abnormal reaction of the patient, or of later complication, without mention of misadventure at the time of the procedure: Secondary | ICD-10-CM | POA: Diagnosis not present

## 2019-01-11 DIAGNOSIS — I10 Essential (primary) hypertension: Secondary | ICD-10-CM | POA: Diagnosis present

## 2019-01-11 MED ORDER — SODIUM CHLORIDE 0.9% FLUSH
3.0000 mL | Freq: Once | INTRAVENOUS | Status: AC
  Administered 2019-01-12: 3 mL via INTRAVENOUS

## 2019-01-12 ENCOUNTER — Encounter (HOSPITAL_COMMUNITY): Payer: Self-pay

## 2019-01-12 ENCOUNTER — Emergency Department (HOSPITAL_COMMUNITY): Payer: Medicare Other

## 2019-01-12 ENCOUNTER — Other Ambulatory Visit: Payer: Self-pay

## 2019-01-12 DIAGNOSIS — K810 Acute cholecystitis: Secondary | ICD-10-CM | POA: Diagnosis present

## 2019-01-12 LAB — RESPIRATORY PANEL BY RT PCR (FLU A&B, COVID)
Influenza A by PCR: NEGATIVE
Influenza B by PCR: NEGATIVE
SARS Coronavirus 2 by RT PCR: NEGATIVE

## 2019-01-12 LAB — LIPASE, BLOOD: Lipase: 23 U/L (ref 11–51)

## 2019-01-12 LAB — URINALYSIS, ROUTINE W REFLEX MICROSCOPIC
Bacteria, UA: NONE SEEN
Bilirubin Urine: NEGATIVE
Glucose, UA: >=500 mg/dL — AB
Hgb urine dipstick: NEGATIVE
Ketones, ur: 20 mg/dL — AB
Leukocytes,Ua: NEGATIVE
Nitrite: NEGATIVE
Protein, ur: NEGATIVE mg/dL
Specific Gravity, Urine: 1.02 (ref 1.005–1.030)
pH: 5 (ref 5.0–8.0)

## 2019-01-12 LAB — PROTIME-INR
INR: 1 (ref 0.8–1.2)
Prothrombin Time: 13 seconds (ref 11.4–15.2)

## 2019-01-12 LAB — LACTIC ACID, PLASMA
Lactic Acid, Venous: 3.1 mmol/L (ref 0.5–1.9)
Lactic Acid, Venous: 2.2 mmol/L (ref 0.5–1.9)

## 2019-01-12 LAB — CBC
HCT: 47.4 % (ref 39.0–52.0)
Hemoglobin: 15.7 g/dL (ref 13.0–17.0)
MCH: 29.2 pg (ref 26.0–34.0)
MCHC: 33.1 g/dL (ref 30.0–36.0)
MCV: 88.3 fL (ref 80.0–100.0)
Platelets: 213 10*3/uL (ref 150–400)
RBC: 5.37 MIL/uL (ref 4.22–5.81)
RDW: 13.2 % (ref 11.5–15.5)
WBC: 7.3 10*3/uL (ref 4.0–10.5)
nRBC: 0 % (ref 0.0–0.2)

## 2019-01-12 LAB — APTT: aPTT: 35 seconds (ref 24–36)

## 2019-01-12 LAB — COMPREHENSIVE METABOLIC PANEL
ALT: 21 U/L (ref 0–44)
AST: 17 U/L (ref 15–41)
Albumin: 4.1 g/dL (ref 3.5–5.0)
Alkaline Phosphatase: 103 U/L (ref 38–126)
Anion gap: 9 (ref 5–15)
BUN: 11 mg/dL (ref 8–23)
CO2: 29 mmol/L (ref 22–32)
Calcium: 9.2 mg/dL (ref 8.9–10.3)
Chloride: 99 mmol/L (ref 98–111)
Creatinine, Ser: 0.97 mg/dL (ref 0.61–1.24)
GFR calc Af Amer: >60 mL/min (ref >60)
GFR calc non Af Amer: >60 mL/min (ref >60)
Glucose, Bld: 153 mg/dL — ABNORMAL HIGH (ref 70–99)
Potassium: 3.7 mmol/L (ref 3.5–5.1)
Sodium: 137 mmol/L (ref 135–145)
Total Bilirubin: 0.4 mg/dL (ref 0.3–1.2)
Total Protein: 6.7 g/dL (ref 6.5–8.1)

## 2019-01-12 LAB — SURGICAL PCR SCREEN
MRSA, PCR: NEGATIVE
Staphylococcus aureus: NEGATIVE

## 2019-01-12 LAB — GLUCOSE, CAPILLARY
Glucose-Capillary: 173 mg/dL — ABNORMAL HIGH (ref 70–99)
Glucose-Capillary: 149 mg/dL — ABNORMAL HIGH (ref 70–99)

## 2019-01-12 MED ORDER — SODIUM CHLORIDE 0.9 % IV BOLUS
1000.0000 mL | Freq: Once | INTRAVENOUS | Status: AC
  Administered 2019-01-12: 1000 mL via INTRAVENOUS

## 2019-01-12 MED ORDER — ENOXAPARIN SODIUM 40 MG/0.4ML ~~LOC~~ SOLN
40.0000 mg | SUBCUTANEOUS | Status: DC
  Administered 2019-01-12: 40 mg via SUBCUTANEOUS
  Filled 2019-01-12: qty 0.4

## 2019-01-12 MED ORDER — SIMETHICONE 80 MG PO CHEW: 40.0000 mg | CHEWABLE_TABLET | Freq: Four times a day (QID) | ORAL | Status: DC | PRN

## 2019-01-12 MED ORDER — ACETAMINOPHEN 650 MG RE SUPP: 650.0000 mg | Freq: Four times a day (QID) | RECTAL | Status: DC | PRN

## 2019-01-12 MED ORDER — SODIUM CHLORIDE 0.9 % IV SOLN
2.0000 g | Freq: Once | INTRAVENOUS | Status: AC
  Administered 2019-01-12: 2 g via INTRAVENOUS
  Filled 2019-01-12: qty 20

## 2019-01-12 MED ORDER — INSULIN ASPART 100 UNIT/ML ~~LOC~~ SOLN
0.0000 [IU] | Freq: Three times a day (TID) | SUBCUTANEOUS | Status: DC
  Administered 2019-01-12 – 2019-01-13 (×2): 3 [IU] via SUBCUTANEOUS
  Administered 2019-01-14: 08:00:00 2 [IU] via SUBCUTANEOUS
  Administered 2019-01-14 – 2019-01-15 (×2): 3 [IU] via SUBCUTANEOUS
  Administered 2019-01-15: 2 [IU] via SUBCUTANEOUS

## 2019-01-12 MED ORDER — SODIUM CHLORIDE 0.45 % IV SOLN: INTRAVENOUS | Status: DC

## 2019-01-12 MED ORDER — METHOCARBAMOL 500 MG PO TABS
500.0000 mg | ORAL_TABLET | Freq: Four times a day (QID) | ORAL | Status: DC | PRN
  Administered 2019-01-14: 500 mg via ORAL
  Filled 2019-01-12: qty 1

## 2019-01-12 MED ORDER — ONDANSETRON HCL 4 MG/2ML IJ SOLN
4.0000 mg | Freq: Once | INTRAMUSCULAR | Status: AC
  Administered 2019-01-12: 4 mg via INTRAVENOUS
  Filled 2019-01-12: qty 2

## 2019-01-12 MED ORDER — ACETAMINOPHEN 325 MG PO TABS
650.0000 mg | ORAL_TABLET | Freq: Four times a day (QID) | ORAL | Status: DC | PRN
  Administered 2019-01-12 – 2019-01-16 (×3): 650 mg via ORAL
  Filled 2019-01-12 (×2): qty 2

## 2019-01-12 MED ORDER — POLYETHYLENE GLYCOL 3350 17 G PO PACK: 17.0000 g | PACK | Freq: Every day | ORAL | Status: DC | PRN

## 2019-01-12 MED ORDER — ONDANSETRON 4 MG PO TBDP: 4.0000 mg | ORAL_TABLET | Freq: Four times a day (QID) | ORAL | Status: DC | PRN

## 2019-01-12 MED ORDER — PANTOPRAZOLE SODIUM 40 MG IV SOLR
40.0000 mg | Freq: Every day | INTRAVENOUS | Status: DC
  Administered 2019-01-12 – 2019-01-14 (×3): 40 mg via INTRAVENOUS
  Filled 2019-01-12 (×3): qty 40

## 2019-01-12 MED ORDER — PIPERACILLIN-TAZOBACTAM 3.375 G IVPB
3.3750 g | Freq: Three times a day (TID) | INTRAVENOUS | Status: DC
  Administered 2019-01-12 – 2019-01-16 (×11): 3.375 g via INTRAVENOUS
  Filled 2019-01-12 (×11): qty 50

## 2019-01-12 MED ORDER — DIPHENHYDRAMINE HCL 12.5 MG/5ML PO ELIX
12.5000 mg | ORAL_SOLUTION | Freq: Four times a day (QID) | ORAL | Status: DC | PRN
  Administered 2019-01-14: 12.5 mg via ORAL
  Filled 2019-01-12: qty 10

## 2019-01-12 MED ORDER — MORPHINE SULFATE (PF) 2 MG/ML IV SOLN
2.0000 mg | Freq: Once | INTRAVENOUS | Status: AC
  Administered 2019-01-12: 2 mg via INTRAVENOUS
  Filled 2019-01-12: qty 1

## 2019-01-12 MED ORDER — MORPHINE SULFATE (PF) 2 MG/ML IV SOLN
1.0000 mg | INTRAVENOUS | Status: DC | PRN
  Administered 2019-01-13: 2 mg via INTRAVENOUS
  Filled 2019-01-12: qty 1

## 2019-01-12 MED ORDER — METOPROLOL TARTRATE 5 MG/5ML IV SOLN: 5.0000 mg | Freq: Four times a day (QID) | INTRAVENOUS | Status: DC | PRN

## 2019-01-12 MED ORDER — DIPHENHYDRAMINE HCL 50 MG/ML IJ SOLN: 12.5000 mg | Freq: Four times a day (QID) | INTRAMUSCULAR | Status: DC | PRN

## 2019-01-12 MED ORDER — DOCUSATE SODIUM 100 MG PO CAPS
100.0000 mg | ORAL_CAPSULE | Freq: Two times a day (BID) | ORAL | Status: DC
  Administered 2019-01-12 – 2019-01-16 (×6): 100 mg via ORAL
  Filled 2019-01-12 (×6): qty 1

## 2019-01-12 MED ORDER — INSULIN ASPART 100 UNIT/ML ~~LOC~~ SOLN
0.0000 [IU] | Freq: Every day | SUBCUTANEOUS | Status: DC
  Administered 2019-01-13: 2 [IU] via SUBCUTANEOUS
  Administered 2019-01-14: 3 [IU] via SUBCUTANEOUS

## 2019-01-12 MED ORDER — OXYCODONE HCL 5 MG PO TABS
5.0000 mg | ORAL_TABLET | ORAL | Status: DC | PRN
  Administered 2019-01-14 – 2019-01-16 (×3): 10 mg via ORAL
  Filled 2019-01-12: qty 2
  Filled 2019-01-12: qty 1
  Filled 2019-01-12 (×2): qty 2

## 2019-01-12 MED ORDER — ACETAMINOPHEN 325 MG PO TABS
650.0000 mg | ORAL_TABLET | Freq: Once | ORAL | Status: AC
  Administered 2019-01-12: 650 mg via ORAL
  Filled 2019-01-12: qty 2

## 2019-01-12 MED ORDER — ONDANSETRON HCL 4 MG/2ML IJ SOLN: 4.0000 mg | Freq: Four times a day (QID) | INTRAMUSCULAR | Status: DC | PRN

## 2019-01-12 NOTE — ED Provider Notes (Signed)
[facility] EMERGENCY DEPARTMENT Provider Note   CSN: [id number] Arrival date & time: 01/11/19  2249     History Chief Complaint  Patient presents with  . Abdominal Pain    Mark Bowen is a 84 y.o. male with PMH significant for T2DM, HTN, HLD, GERD, and prostate cancer who presents to the ED with a 12-hour history of 10 out of 10 right upper quadrant abdominal pain.  Patient states that he was feeling fine yesterday when he suddenly developed this excruciating abdominal pain that radiated to his back, immediately inferior to right scapula.  He endorses mild nausea, but no vomiting.  He has not eaten since 6 PM last night.  He reports that when he was younger a doctor told him that he had large gallstones.  He denies any recent illness, fevers or chills, chest pain or difficulty breathing, urinary symptoms, or changes in bowel habits.  He has not taken anything for his pain.   HPI     Past Medical History:  Diagnosis Date  . Arthritis   . Diabetes mellitus 2007   type 2  . Diverticulitis   . GERD (gastroesophageal reflux disease)   . Hypercholesterolemia since 2008 preventative   taking Zocor, now cholesterol WNL  . Hypertension since 1970  . Prostate CA ([place])   . Shingles     Patient Active Problem List   Diagnosis Date Noted  . Acute cholecystitis 01/12/2019  . HLD (hyperlipidemia) 06/10/2016  . Sepsis ([place]) 06/10/2016  . Diabetes mellitus without complication ([place]) [id number]  . Fever   . DIVERTICULITIS-COLON 01/08/2008  . CHOLELITHIASIS 01/08/2008  . PERSONAL HX COLONIC POLYPS 01/08/2008  . DIABETES MELLITUS, TYPE I, CONTROLLED 12/31/2007  . Essential hypertension 12/31/2007  . GERD 12/31/2007  . ARTHRITIS 12/31/2007  . HEMORRHOIDS, INTERNAL 05/02/2006  . DIVERTICULOSIS, COLON 05/02/2006    Past Surgical History:  Procedure Laterality Date  . APPENDECTOMY  1960  . [place] & 2009  . INGUINAL HERNIA REPAIR  1970   bilateral  .  PROSTATE SURGERY    . TONSILLECTOMY  1960       Family History  Problem Relation Age of Onset  . Lung cancer Father   . Prostate cancer Brother        x 3 brothers  . Colon cancer Neg Hx   . Stomach cancer Neg Hx     Social History   Tobacco Use  . Smoking status: Former Smoker    Quit date: 04/04/1959    Years since quitting: 59.8  . Smokeless tobacco: Never Used  Substance Use Topics  . Alcohol use: No  . Drug use: No    Home Medications Prior to Admission medications   Medication Sig Start Date End Date Taking? Authorizing Provider  acetaminophen (TYLENOL) 325 MG tablet Take 2 tablets (650 mg total) by mouth every 6 (six) hours as needed for mild pain, fever or headache. 06/13/16   Reyne Dumas, MD  aspirin 81 MG tablet Take 81 mg by mouth daily.    [provider]  benazepril (LOTENSIN) 20 MG tablet Take 1 tablet (20 mg total) by mouth daily. 06/20/16   Reyne Dumas, MD  fish oil-omega-3 fatty acids 1000 MG capsule Take 1 g by mouth daily.     [provider]  gabapentin (NEURONTIN) 600 MG tablet Take 0.5 tablets (300 mg total) by mouth 2 (two) times daily. 06/15/16 07/15/16  Reyne Dumas, MD  omeprazole (PRILOSEC) 20 MG capsule  Take 20 mg by mouth daily.    [provider]  simvastatin (ZOCOR) 40 MG tablet Take 20 mg by mouth every morning.    [provider]  sitaGLIPtan-metformin (JANUMET) 50-1000 MG per tablet Take 1 tablet by mouth 2 (two) times daily with a meal.    [provider]    Allergies    Iodine and Iohexol  Review of Systems   Review of Systems  All other systems reviewed and are negative.   Physical Exam Updated Vital Signs BP 105/66 (BP Location: Right Arm)   Pulse 96   Temp (!) 101.6 F (38.7 C) (Rectal)   Resp 20   SpO2 98%   Physical Exam Vitals and nursing note reviewed. Exam conducted with a chaperone present.  Constitutional:      Appearance: He is ill-appearing.  HENT:     Head:  Normocephalic and atraumatic.  Eyes:     General: No scleral icterus.    Conjunctiva/sclera: Conjunctivae normal.  Cardiovascular:     Rate and Rhythm: Regular rhythm. Tachycardia present.     Pulses: Normal pulses.     Heart sounds: Normal heart sounds.  Pulmonary:     Effort: Pulmonary effort is normal. No respiratory distress.     Breath sounds: Normal breath sounds. No wheezing or rales.  Abdominal:     Comments: Nondistended.  Significant TTP RUQ and moderate TTP in RLQ and epigastrium.  No TTP elsewhere.  Involuntary guarding.  No overlying skin changes.  Skin:    General: Skin is dry.  Neurological:     Mental Status: He is alert and oriented to person, place, and time.     GCS: GCS eye subscore is 4. GCS verbal subscore is 5. GCS motor subscore is 6.  Psychiatric:        Mood and Affect: Mood normal.        Behavior: Behavior normal.        Thought Content: Thought content normal.     ED Results / Procedures / Treatments   Labs (all labs ordered are listed, but only abnormal results are displayed) Labs Reviewed  COMPREHENSIVE METABOLIC PANEL - Abnormal; Notable for the following components:      Result Value   Glucose, Bld 153 (*)    All other components within normal limits  URINALYSIS, ROUTINE W REFLEX MICROSCOPIC - Abnormal; Notable for the following components:   Glucose, UA >=500 (*)    Ketones, ur 20 (*)    All other components within normal limits  LACTIC ACID, PLASMA - Abnormal; Notable for the following components:   Lactic Acid, Venous 3.1 (*)    All other components within normal limits  CULTURE, BLOOD (ROUTINE X 2)  CULTURE, BLOOD (ROUTINE X 2)  URINE CULTURE  RESPIRATORY PANEL BY RT PCR (FLU A&B, COVID)  LIPASE, BLOOD  CBC  APTT  PROTIME-INR  LACTIC ACID, PLASMA  HEMOGLOBIN A1C    EKG EKG Interpretation  Date/Time:  Monday January 12 2019 11:18:12 EST Ventricular Rate:  99 PR Interval:    QRS Duration: 99 QT Interval:  342 QTC  Calculation: 439 R Axis:   -6 Text Interpretation: Sinus rhythm Prolonged PR interval Borderline T wave abnormalities Confirmed by Sherwood Gambler [phone]) on 01/12/2019 12:03:35 PM   Radiology US Abdomen Limited  Result Date: 01/12/2019 CLINICAL DATA:  Upper abdominal pain EXAM: ULTRASOUND ABDOMEN LIMITED RIGHT UPPER QUADRANT COMPARISON:  None. FINDINGS: Gallbladder: There is a 1.3 cm calculus adherent in the neck of  the gallbladder. The gallbladder wall is thickened with pericholecystic fluid. There is tenderness focally over the gallbladder. Common bile duct: Diameter: 4 mm. No intrahepatic or extrahepatic biliary duct dilatation. Liver: No focal lesion identified. Within normal limits in parenchymal echogenicity. Portal vein is patent on color Doppler imaging with normal direction of blood flow towards the liver. Other: None. IMPRESSION: There is a gallstone imbedded in the neck of the gallbladder with gallbladder wall thickening, pericholecystic fluid, and focal tenderness over the gallbladder. These are findings indicative of acute cholecystitis. Study otherwise unremarkable. Electronically Signed   By: Lowella Grip III M.D.   On: 01/12/2019 12:34    Procedures .Critical Care Performed by: Corena Herter, PA-C Authorized by: Corena Herter, PA-C   Critical care provider statement:    Critical care time (minutes):  30   Critical care was necessary to treat or prevent imminent or life-threatening deterioration of the following conditions:  Sepsis   Critical care was time spent personally by me on the following activities:  Discussions with consultants, evaluation of patient's response to treatment, examination of patient, ordering and performing treatments and interventions, ordering and review of laboratory studies, ordering and review of radiographic studies, pulse oximetry, re-evaluation of patient's condition, obtaining history from patient or surrogate, review of old charts and blood  draw for specimens   (including critical care time)  Medications Ordered in ED Medications  enoxaparin (LOVENOX) injection 40 mg (has no administration in time range)  0.45 % sodium chloride infusion (has no administration in time range)  metoprolol tartrate (LOPRESSOR) injection 5 mg (has no administration in time range)  pantoprazole (PROTONIX) injection 40 mg (has no administration in time range)  simethicone (MYLICON) chewable tablet 40 mg (has no administration in time range)  ondansetron (ZOFRAN-ODT) disintegrating tablet 4 mg (has no administration in time range)    Or  ondansetron (ZOFRAN) injection 4 mg (has no administration in time range)  docusate sodium (COLACE) capsule 100 mg (has no administration in time range)  polyethylene glycol (MIRALAX / GLYCOLAX) packet 17 g (has no administration in time range)  diphenhydrAMINE (BENADRYL) 12.5 MG/5ML elixir 12.5 mg (has no administration in time range)    Or  diphenhydrAMINE (BENADRYL) injection 12.5 mg (has no administration in time range)  methocarbamol (ROBAXIN) tablet 500 mg (has no administration in time range)  morphine 2 MG/ML injection 1-2 mg (has no administration in time range)  oxyCODONE (Oxy IR/ROXICODONE) immediate release tablet 5-10 mg (has no administration in time range)  acetaminophen (TYLENOL) tablet 650 mg (has no administration in time range)    Or  acetaminophen (TYLENOL) suppository 650 mg (has no administration in time range)  piperacillin-tazobactam (ZOSYN) IVPB 3.375 g (has no administration in time range)  insulin aspart (novoLOG) injection 0-15 Units (has no administration in time range)  insulin aspart (novoLOG) injection 0-5 Units (has no administration in time range)  sodium chloride flush (NS) 0.9 % injection 3 mL (3 mLs Intravenous Given 01/12/19 1030)  sodium chloride 0.9 % bolus 1,000 mL (0 mLs Intravenous Stopped 01/12/19 1300)  ondansetron (ZOFRAN) injection 4 mg (4 mg Intravenous Given 01/12/19  1033)  morphine 2 MG/ML injection 2 mg (2 mg Intravenous Given 01/12/19 1035)  cefTRIAXone (ROCEPHIN) 2 g in sodium chloride 0.9 % 100 mL IVPB (0 g Intravenous Stopped 01/12/19 1156)  acetaminophen (TYLENOL) tablet 650 mg (650 mg Oral Given 01/12/19 1158)    ED Course  I have reviewed the triage vital signs and the nursing notes.  Pertinent labs & imaging results that were available during my care of the patient were reviewed by me and considered in my medical decision making (see chart for details).  Clinical Course as of Jan 11 1425  Mon Jan 12, 2019  1246 Spoke with Jerene Pitch who will evaluate and admit patient to general surgery services   [GG]    Clinical Course User Index [GG] Corena Herter, PA-C   MDM Rules/Calculators/A&P                      Patient's history and physical exam is concerning for an acute cholecystitis.  Patient reports that he has documented cholelithiasis in the past.  On my exam, patient was tachycardic to 120, borderline tachypneic, and rectal temperature demonstrated fever of 101.6 F.  For those reasons, initiated sepsis protocol and started patient on empiric Rocephin as well as IVF.  Will obtain limited abdominal ultrasound on RUQ to assess for cholecystitis.  UA demonstrated no RBCs and lower suspicion for ureterolithiasis.  Patient's right subscapular discomfort could be Boas sign for cholecystitis.   Right upper quadrant ultrasound demonstrates a gallstone embedded in the neck of the gallbladder with gallbladder wall thickening and other evidence of an acute cholecystitis.  Will consult with general surgery for admission.  Spoke with Margie Billet, PA-C with general surgery who will see and admit patient.  Final Clinical Impression(s) / ED Diagnoses Final diagnoses:  Acute cholecystitis    Rx / DC Orders ED Discharge Orders    None       Corena Herter, PA-C 01/12/19 [place], MD 01/13/19 [phone]

## 2019-01-12 NOTE — H&P (Signed)
[facility] Admission Note  Mark Bowen 11/28/1935  [id number].    Requesting MD: Sherwood Gambler Chief Complaint/Reason for Consult: acute cholecystitis  HPI:  Mark Bowen is an 84yo male PMH HTN, DM, HLD, GERD, h/o prostate cancer s/p surgery who presented to [facility] last night complaining of worsening abdominal pain.  States that it started around 2100 after dinner.  Pain is RUQ and radiates into his back. Worse with palpation or movement. Denies nausea, vomiting, fever, chills, dysuria. States that he had one similar episode decades ago, but nothing ever this severe. In the ED he underwent u/s which shows a gallstone imbedded in the neck of the gallbladder with gallbladder wall thickening, pericholecystic fluid, and focal tenderness over the gallbladder consistent with acute cholecystitis. LFTs and lipase WNL. WBC 7.3, lactic acid 3.1. General surgery asked to see.  Abdominal surgical history: open appendectomy, bilateral inguinal hernia repairs Anticoagulants: none Nonsmoker Drinks alcohol only occassionally Lives at home with his wife who suffers from dementia, he is her care taker  ROS: Review of Systems  Constitutional: Negative.   HENT: Negative.   Eyes: Negative.   Respiratory: Negative.   Cardiovascular: Negative.   Gastrointestinal: Positive for abdominal pain. Negative for constipation, diarrhea, nausea and vomiting.  Genitourinary: Negative.   Musculoskeletal: Positive for back pain and neck pain.  Skin: Negative.   Neurological: Negative.     All systems reviewed and otherwise negative except for as above  Family History  Problem Relation Age of Onset  . Lung cancer Father   . Prostate cancer Brother        x 3 brothers  . Colon cancer Neg Hx   . Stomach cancer Neg Hx     Past Medical History:  Diagnosis Date  . Arthritis   . Diabetes mellitus 2007   type 2  . Diverticulitis   . GERD (gastroesophageal reflux disease)   .  Hypercholesterolemia since 2008 preventative   taking Zocor, now cholesterol WNL  . Hypertension since 1970  . Prostate CA ([place])   . Shingles     Past Surgical History:  Procedure Laterality Date  . APPENDECTOMY  1960  . [place] & 2009  . INGUINAL HERNIA REPAIR  1970   bilateral  . PROSTATE SURGERY    . TONSILLECTOMY  1960    Social History:  reports that he quit smoking about 59 years ago. He has never used smokeless tobacco. He reports that he does not drink alcohol or use drugs.  Allergies:  Allergies  Allergen Reactions  . Iodine   . Iohexol      Code: HIVES, Desc: pt. states he breaks out in hives with iv dye     (Not in a hospital admission)   Prior to Admission medications   Medication Sig Start Date End Date Taking? Authorizing Provider  acetaminophen (TYLENOL) 325 MG tablet Take 2 tablets (650 mg total) by mouth every 6 (six) hours as needed for mild pain, fever or headache. 06/13/16   Reyne Dumas, MD  aspirin 81 MG tablet Take 81 mg by mouth daily.    [provider]  benazepril (LOTENSIN) 20 MG tablet Take 1 tablet (20 mg total) by mouth daily. 06/20/16   Reyne Dumas, MD  fish oil-omega-3 fatty acids 1000 MG capsule Take 1 g by mouth daily.     [provider]  gabapentin (NEURONTIN) 600 MG tablet Take 0.5 tablets (300 mg total) by mouth 2 (two) times daily. 06/15/16  07/15/16  Reyne Dumas, MD  omeprazole (PRILOSEC) 20 MG capsule Take 20 mg by mouth daily.    [provider]  simvastatin (ZOCOR) 40 MG tablet Take 20 mg by mouth every morning.    [provider]  sitaGLIPtan-metformin (JANUMET) 50-1000 MG per tablet Take 1 tablet by mouth 2 (two) times daily with a meal.    [provider]    Blood pressure 105/66, pulse 96, temperature (!) 101.6 F (38.7 C), temperature source Rectal, resp. rate 20, SpO2 98 %. Physical Exam: General: pleasant, WD/WN white male who is laying in bed in NAD HEENT: head is  normocephalic, atraumatic.  Sclera are noninjected.  Pupils equal and round.  Ears and nose without any masses or lesions.  Mouth is pink and moist. Dentition fair Heart: regular, rate, and rhythm.  No obvious murmurs, gallops, or rubs noted.  Palpable pedal pulses bilaterally Lungs: CTAB, no wheezes, rhonchi, or rales noted.  Respiratory effort nonlabored Abd: soft, ND, +BS, no masses, hernias, or organomegaly. TTP epigastric region and RUQ with voluntary guarding MS: calves soft and nontender without edema Skin: warm and dry with no masses, lesions, or rashes Psych: A&Ox3 with an appropriate affect. Neuro: cranial nerves grossly intact, extremity CSM intact bilaterally, normal speech  Results for orders placed or performed during the hospital encounter of 01/11/19 (from the past 48 hour(s))  Lipase, blood     Status: None   Collection Time: 01/11/19 11:23 PM  Result Value Ref Range   Lipase 23 11 - 51 U/L    Comment: Performed at [facility], [place] [employer] [address], [place], [place] [address]  Comprehensive metabolic panel     Status: Abnormal   Collection Time: 01/11/19 11:23 PM  Result Value Ref Range   Sodium 137 135 - 145 mmol/L   Potassium 3.7 3.5 - 5.1 mmol/L   Chloride 99 98 - 111 mmol/L   CO2 29 22 - 32 mmol/L   Glucose, Bld 153 (H) 70 - 99 mg/dL   BUN 11 8 - 23 mg/dL   Creatinine, Ser 0.97 0.61 - 1.24 mg/dL   Calcium 9.2 8.9 - 10.3 mg/dL   Total Protein 6.7 6.5 - 8.1 g/dL   Albumin 4.1 3.5 - 5.0 g/dL   AST 17 15 - 41 U/L   ALT 21 0 - 44 U/L   Alkaline Phosphatase 103 38 - 126 U/L   Total Bilirubin 0.4 0.3 - 1.2 mg/dL   GFR calc non Af Amer >60 >60 mL/min   GFR calc Af Amer >60 >60 mL/min   Anion gap 9 5 - 15    Comment: Performed at [facility] [address], [place] [address]  CBC     Status: None   Collection Time: 01/11/19 11:23 PM  Result Value Ref Range   WBC 7.3 4.0 - 10.5 K/uL   RBC 5.37 4.22 - 5.81 MIL/uL   Hemoglobin 15.7 13.0 - 17.0 g/dL    HCT 47.4 39.0 - 52.0 %   MCV 88.3 80.0 - 100.0 fL   MCH 29.2 26.0 - 34.0 pg   MCHC 33.1 30.0 - 36.0 g/dL   RDW 13.2 11.5 - 15.5 %   Platelets 213 150 - 400 K/uL   nRBC 0.0 0.0 - 0.2 %    Comment: Performed at [facility], [place] [address] [place], [place] [address]  Urinalysis, Routine w reflex microscopic     Status: Abnormal   Collection Time: 01/12/19  8:34 AM  Result Value Ref Range   Color, Urine YELLOW YELLOW   APPearance CLEAR CLEAR   Specific Gravity, Urine 1.020 1.005 - 1.030   pH 5.0 5.0 - 8.0   Glucose, UA >=500 (A) NEGATIVE mg/dL   Hgb urine dipstick NEGATIVE NEGATIVE   Bilirubin Urine NEGATIVE NEGATIVE   Ketones, ur 20 (A) NEGATIVE mg/dL   Protein, ur NEGATIVE NEGATIVE mg/dL   Nitrite NEGATIVE NEGATIVE   Leukocytes,Ua NEGATIVE NEGATIVE   RBC / HPF 0-5 0 - 5 RBC/hpf   WBC, UA 0-5 0 - 5 WBC/hpf   Bacteria, UA NONE SEEN NONE SEEN    Comment: Performed at [facility] [address] [place], [place] [address]  Lactic acid, plasma     Status: Abnormal   Collection Time: 01/12/19 10:12 AM  Result Value Ref Range   Lactic Acid, Venous 3.1 (HH) 0.5 - 1.9 mmol/L    Comment: CRITICAL RESULT CALLED TO, READ BACK BY AND VERIFIED WITH: [place] 01/12/2019 [place] Performed at [facility] [address] [place], [place] [address]   APTT     Status: None   Collection Time: 01/12/19 10:12 AM  Result Value Ref Range   aPTT 35 24 - 36 seconds    Comment: Performed at [facility] [address], [place], [place] [address]  Protime-INR     Status: None   Collection Time: 01/12/19 10:12 AM  Result Value Ref Range   Prothrombin Time 13.0 11.4 - 15.2 seconds   INR 1.0 0.8 - 1.2    Comment: (NOTE) INR goal varies based on device and disease states. Performed at [facility], [place] [address] [place], [place] [address]    US Abdomen Limited  Result Date: 01/12/2019 CLINICAL DATA:  Upper abdominal pain EXAM: ULTRASOUND ABDOMEN  LIMITED RIGHT UPPER QUADRANT COMPARISON:  None. FINDINGS: Gallbladder: There is a 1.3 cm calculus adherent in the neck of the gallbladder. The gallbladder wall is thickened with pericholecystic fluid. There is tenderness focally over the gallbladder. Common bile duct: Diameter: 4 mm. No intrahepatic or extrahepatic biliary duct dilatation. Liver: No focal lesion identified. Within normal limits in parenchymal echogenicity. Portal vein is patent on color Doppler imaging with normal direction of blood flow towards the liver. Other: None. IMPRESSION: There is a gallstone imbedded in the neck of the gallbladder with gallbladder wall thickening, pericholecystic fluid, and focal tenderness over the gallbladder. These are findings indicative of acute cholecystitis. Study otherwise unremarkable. Electronically Signed   By: Lowella Grip III M.D.   On: 01/12/2019 12:34      Assessment/Plan HTN DM - SSI HLD GERD H/o prostate cancer s/p surgery Lactic acidosis - 3.1, repeat pending  Acute cholecystitis - Patient with acute cholecystitis. Will admit for observation to med-surg, start on IV zosyn. Ok for liquids today, NPO after midnight in anticipation for laparoscopic cholecystectomy tomorrow. Covid test pending. Home med reconciliation is pending.  ID - rocephin x1. Zosyn 1/4>> VTE - SCDs, lovenox FEN - IVF, FLD, NPO after MN Foley - none Follow up - TBD  [place], [facility] 01/12/2019, 2:23 PM Please see Amion for pager number during day hours 7:00am-4:30pm

## 2019-01-12 NOTE — ED Notes (Signed)
Lactic 2.2

## 2019-01-12 NOTE — ED Notes (Signed)
Got patient on the monitor patient is resting with call bell in reach  ?

## 2019-01-12 NOTE — Anesthesia Preprocedure Evaluation (Addendum)
Anesthesia Evaluation  Patient identified by MRN, date of birth, ID band Patient awake    Reviewed: Allergy & Precautions, NPO status , Patient's Chart, lab work & pertinent test results  Airway Mallampati: II  TM Distance: >3 FB Neck ROM: Full    Dental  (+) Upper Dentures, Partial Lower   Pulmonary neg pulmonary ROS, former smoker,    Pulmonary exam normal breath sounds clear to auscultation       Cardiovascular Exercise Tolerance: Good hypertension, Pt. on medications and Pt. on home beta blockers Normal cardiovascular exam Rhythm:Regular Rate:Normal  01/12/19 EKG Sr R 99 w nsst changes   Neuro/Psych negative neurological ROS     GI/Hepatic Neg liver ROS, GERD  ,  Endo/Other  diabetes, Type 2, Oral Hypoglycemic Agents  Renal/GU K+ 3.7 Cr 0.97   Hx of prostate CA    Musculoskeletal negative musculoskeletal ROS (+)   Abdominal   Peds  Hematology hgb 15.7   Anesthesia Other Findings All lodeine, lohexaol  Reproductive/Obstetrics                            Anesthesia Physical Anesthesia Plan  ASA: III  Anesthesia Plan: General   Post-op Pain Management:    Induction: Intravenous  PONV Risk Score and Plan: 3 and Treatment may vary due to age or medical condition and Ondansetron  Airway Management Planned: Oral ETT  Additional Equipment: None  Intra-op Plan:   Post-operative Plan: Extubation in OR  Informed Consent: I have reviewed the patients History and Physical, chart, labs and discussed the procedure including the risks, benefits and alternatives for the proposed anesthesia with the patient or authorized representative who has indicated his/her understanding and acceptance.     Dental advisory given  Plan Discussed with: CRNA  Anesthesia Plan Comments:        Anesthesia Quick Evaluation

## 2019-01-12 NOTE — Progress Notes (Signed)
Notified bedside nurse of need to draw repeat lactic acid. 

## 2019-01-13 ENCOUNTER — Observation Stay (HOSPITAL_COMMUNITY): Payer: Medicare Other | Admitting: Anesthesiology

## 2019-01-13 ENCOUNTER — Encounter (HOSPITAL_COMMUNITY): Payer: Self-pay

## 2019-01-13 ENCOUNTER — Encounter (HOSPITAL_COMMUNITY): Admission: EM | Disposition: A | Discharge status: Home / Self Care | Payer: Self-pay | Source: Home / Self Care

## 2019-01-13 DIAGNOSIS — K812 Acute cholecystitis with chronic cholecystitis: Secondary | ICD-10-CM | POA: Diagnosis present

## 2019-01-13 DIAGNOSIS — Z7982 Long term (current) use of aspirin: Secondary | ICD-10-CM | POA: Diagnosis not present

## 2019-01-13 DIAGNOSIS — K82A1 Gangrene of gallbladder in cholecystitis: Secondary | ICD-10-CM | POA: Diagnosis present

## 2019-01-13 DIAGNOSIS — Z8546 Personal history of malignant neoplasm of prostate: Secondary | ICD-10-CM | POA: Diagnosis not present

## 2019-01-13 DIAGNOSIS — R1013 Epigastric pain: Secondary | ICD-10-CM | POA: Diagnosis not present

## 2019-01-13 DIAGNOSIS — Z9049 Acquired absence of other specified parts of digestive tract: Secondary | ICD-10-CM | POA: Diagnosis not present

## 2019-01-13 DIAGNOSIS — K838 Other specified diseases of biliary tract: Secondary | ICD-10-CM | POA: Diagnosis not present

## 2019-01-13 DIAGNOSIS — Z20822 Contact with and (suspected) exposure to covid-19: Secondary | ICD-10-CM | POA: Diagnosis present

## 2019-01-13 DIAGNOSIS — Z8042 Family history of malignant neoplasm of prostate: Secondary | ICD-10-CM | POA: Diagnosis not present

## 2019-01-13 DIAGNOSIS — K839 Disease of biliary tract, unspecified: Secondary | ICD-10-CM | POA: Diagnosis not present

## 2019-01-13 DIAGNOSIS — K9189 Other postprocedural complications and disorders of digestive system: Secondary | ICD-10-CM | POA: Diagnosis not present

## 2019-01-13 DIAGNOSIS — I10 Essential (primary) hypertension: Secondary | ICD-10-CM | POA: Diagnosis present

## 2019-01-13 DIAGNOSIS — Z87891 Personal history of nicotine dependence: Secondary | ICD-10-CM | POA: Diagnosis not present

## 2019-01-13 DIAGNOSIS — K859 Acute pancreatitis without necrosis or infection, unspecified: Secondary | ICD-10-CM | POA: Diagnosis not present

## 2019-01-13 DIAGNOSIS — Z79899 Other long term (current) drug therapy: Secondary | ICD-10-CM | POA: Diagnosis not present

## 2019-01-13 DIAGNOSIS — Z801 Family history of malignant neoplasm of trachea, bronchus and lung: Secondary | ICD-10-CM | POA: Diagnosis not present

## 2019-01-13 DIAGNOSIS — E872 Acidosis: Secondary | ICD-10-CM | POA: Diagnosis present

## 2019-01-13 DIAGNOSIS — Z7984 Long term (current) use of oral hypoglycemic drugs: Secondary | ICD-10-CM | POA: Diagnosis not present

## 2019-01-13 DIAGNOSIS — E7800 Pure hypercholesterolemia, unspecified: Secondary | ICD-10-CM | POA: Diagnosis present

## 2019-01-13 DIAGNOSIS — K219 Gastro-esophageal reflux disease without esophagitis: Secondary | ICD-10-CM | POA: Diagnosis present

## 2019-01-13 DIAGNOSIS — K4020 Bilateral inguinal hernia, without obstruction or gangrene, not specified as recurrent: Secondary | ICD-10-CM | POA: Diagnosis present

## 2019-01-13 DIAGNOSIS — E119 Type 2 diabetes mellitus without complications: Secondary | ICD-10-CM | POA: Diagnosis present

## 2019-01-13 DIAGNOSIS — E785 Hyperlipidemia, unspecified: Secondary | ICD-10-CM | POA: Diagnosis present

## 2019-01-13 DIAGNOSIS — Y838 Other surgical procedures as the cause of abnormal reaction of the patient, or of later complication, without mention of misadventure at the time of the procedure: Secondary | ICD-10-CM | POA: Diagnosis not present

## 2019-01-13 DIAGNOSIS — K810 Acute cholecystitis: Secondary | ICD-10-CM | POA: Diagnosis present

## 2019-01-13 DIAGNOSIS — Z86010 Personal history of colonic polyps: Secondary | ICD-10-CM | POA: Diagnosis not present

## 2019-01-13 HISTORY — PX: CHOLECYSTECTOMY: SHX55

## 2019-01-13 LAB — CBC
HCT: 40.7 % (ref 39.0–52.0)
Hemoglobin: 14 g/dL (ref 13.0–17.0)
MCH: 29.4 pg (ref 26.0–34.0)
MCHC: 34.4 g/dL (ref 30.0–36.0)
MCV: 85.5 fL (ref 80.0–100.0)
Platelets: 147 10*3/uL — ABNORMAL LOW (ref 150–400)
RBC: 4.76 MIL/uL (ref 4.22–5.81)
RDW: 13.4 % (ref 11.5–15.5)
WBC: 9.8 10*3/uL (ref 4.0–10.5)
nRBC: 0 % (ref 0.0–0.2)

## 2019-01-13 LAB — COMPREHENSIVE METABOLIC PANEL
ALT: 26 U/L (ref 0–44)
AST: 22 U/L (ref 15–41)
Albumin: 2.8 g/dL — ABNORMAL LOW (ref 3.5–5.0)
Alkaline Phosphatase: 60 U/L (ref 38–126)
Anion gap: 8 (ref 5–15)
BUN: 8 mg/dL (ref 8–23)
CO2: 24 mmol/L (ref 22–32)
Calcium: 8.5 mg/dL — ABNORMAL LOW (ref 8.9–10.3)
Chloride: 104 mmol/L (ref 98–111)
Creatinine, Ser: 0.91 mg/dL (ref 0.61–1.24)
GFR calc Af Amer: >60 mL/min (ref >60)
GFR calc non Af Amer: >60 mL/min (ref >60)
Glucose, Bld: 154 mg/dL — ABNORMAL HIGH (ref 70–99)
Potassium: 3.6 mmol/L (ref 3.5–5.1)
Sodium: 136 mmol/L (ref 135–145)
Total Bilirubin: 1.1 mg/dL (ref 0.3–1.2)
Total Protein: 5.2 g/dL — ABNORMAL LOW (ref 6.5–8.1)

## 2019-01-13 LAB — HEMOGLOBIN A1C
Hgb A1c MFr Bld: 6.9 % — ABNORMAL HIGH (ref 4.8–5.6)
Mean Plasma Glucose: 151 mg/dL

## 2019-01-13 LAB — URINE CULTURE: Culture: <10000 — AB

## 2019-01-13 LAB — GLUCOSE, CAPILLARY
Glucose-Capillary: 120 mg/dL — ABNORMAL HIGH (ref 70–99)
Glucose-Capillary: 135 mg/dL — ABNORMAL HIGH (ref 70–99)
Glucose-Capillary: 138 mg/dL — ABNORMAL HIGH (ref 70–99)
Glucose-Capillary: 165 mg/dL — ABNORMAL HIGH (ref 70–99)
Glucose-Capillary: 209 mg/dL — ABNORMAL HIGH (ref 70–99)

## 2019-01-13 SURGERY — LAPAROSCOPIC CHOLECYSTECTOMY
Anesthesia: General | Site: Abdomen

## 2019-01-13 MED ORDER — PROPOFOL 10 MG/ML IV BOLUS
INTRAVENOUS | Status: DC | PRN
  Administered 2019-01-13: 110 mg via INTRAVENOUS

## 2019-01-13 MED ORDER — PHENYLEPHRINE 40 MCG/ML (10ML) SYRINGE FOR IV PUSH (FOR BLOOD PRESSURE SUPPORT)
PREFILLED_SYRINGE | INTRAVENOUS | Status: AC
  Filled 2019-01-13: qty 10

## 2019-01-13 MED ORDER — DEXAMETHASONE SODIUM PHOSPHATE 10 MG/ML IJ SOLN
INTRAMUSCULAR | Status: AC
  Filled 2019-01-13: qty 1

## 2019-01-13 MED ORDER — STERILE WATER FOR IRRIGATION IR SOLN
Status: DC | PRN
  Administered 2019-01-13: 1000 mL

## 2019-01-13 MED ORDER — SIMVASTATIN 20 MG PO TABS
20.0000 mg | ORAL_TABLET | Freq: Every morning | ORAL | Status: DC
  Administered 2019-01-13 – 2019-01-16 (×4): 20 mg via ORAL
  Filled 2019-01-13 (×4): qty 1

## 2019-01-13 MED ORDER — ROCURONIUM BROMIDE 50 MG/5ML IV SOSY
PREFILLED_SYRINGE | INTRAVENOUS | Status: DC | PRN
  Administered 2019-01-13: 40 mg via INTRAVENOUS
  Administered 2019-01-13: 10 mg via INTRAVENOUS

## 2019-01-13 MED ORDER — ONDANSETRON HCL 4 MG/2ML IJ SOLN
INTRAMUSCULAR | Status: DC | PRN
  Administered 2019-01-13: 4 mg via INTRAVENOUS

## 2019-01-13 MED ORDER — FENTANYL CITRATE (PF) 100 MCG/2ML IJ SOLN: 25.0000 ug | INTRAMUSCULAR | Status: DC | PRN

## 2019-01-13 MED ORDER — BENAZEPRIL HCL 5 MG PO TABS
20.0000 mg | ORAL_TABLET | Freq: Every day | ORAL | Status: DC
  Administered 2019-01-13 – 2019-01-16 (×4): 20 mg via ORAL
  Filled 2019-01-13 (×5): qty 4

## 2019-01-13 MED ORDER — BUPIVACAINE HCL (PF) 0.25 % IJ SOLN
INTRAMUSCULAR | Status: DC | PRN
  Administered 2019-01-13: 10 mL

## 2019-01-13 MED ORDER — ACETAMINOPHEN 10 MG/ML IV SOLN
INTRAVENOUS | Status: DC | PRN
  Administered 2019-01-13: 1000 mg via INTRAVENOUS

## 2019-01-13 MED ORDER — SODIUM CHLORIDE 0.9 % IR SOLN
Status: DC | PRN
  Administered 2019-01-13: 1000 mL

## 2019-01-13 MED ORDER — ACETAMINOPHEN 10 MG/ML IV SOLN
INTRAVENOUS | Status: AC
  Filled 2019-01-13: qty 100

## 2019-01-13 MED ORDER — EPHEDRINE 5 MG/ML INJ
INTRAVENOUS | Status: AC
  Filled 2019-01-13: qty 10

## 2019-01-13 MED ORDER — LIDOCAINE 2% (20 MG/ML) 5 ML SYRINGE
INTRAMUSCULAR | Status: AC
  Filled 2019-01-13: qty 15

## 2019-01-13 MED ORDER — LIDOCAINE 2% (20 MG/ML) 5 ML SYRINGE
INTRAMUSCULAR | Status: DC | PRN
  Administered 2019-01-13: 60 mg via INTRAVENOUS

## 2019-01-13 MED ORDER — ONDANSETRON HCL 4 MG/2ML IJ SOLN
INTRAMUSCULAR | Status: AC
  Filled 2019-01-13: qty 2

## 2019-01-13 MED ORDER — PROPOFOL 10 MG/ML IV BOLUS
INTRAVENOUS | Status: AC
  Filled 2019-01-13: qty 20

## 2019-01-13 MED ORDER — LACTATED RINGERS IV SOLN: INTRAVENOUS | Status: DC

## 2019-01-13 MED ORDER — 0.9 % SODIUM CHLORIDE (POUR BTL) OPTIME
TOPICAL | Status: DC | PRN
  Administered 2019-01-13: 1000 mL

## 2019-01-13 MED ORDER — ONDANSETRON HCL 4 MG/2ML IJ SOLN: 4.0000 mg | Freq: Once | INTRAMUSCULAR | Status: DC | PRN

## 2019-01-13 MED ORDER — ALBUTEROL SULFATE HFA 108 (90 BASE) MCG/ACT IN AERS
INHALATION_SPRAY | RESPIRATORY_TRACT | Status: AC
  Filled 2019-01-13: qty 6.7

## 2019-01-13 MED ORDER — FENTANYL CITRATE (PF) 100 MCG/2ML IJ SOLN
INTRAMUSCULAR | Status: DC | PRN
  Administered 2019-01-13 (×5): 50 ug via INTRAVENOUS

## 2019-01-13 MED ORDER — ENOXAPARIN SODIUM 40 MG/0.4ML ~~LOC~~ SOLN
40.0000 mg | SUBCUTANEOUS | Status: DC
  Administered 2019-01-13 – 2019-01-15 (×2): 40 mg via SUBCUTANEOUS
  Filled 2019-01-13 (×2): qty 0.4

## 2019-01-13 MED ORDER — LACTATED RINGERS IV SOLN: INTRAVENOUS | Status: DC | PRN

## 2019-01-13 MED ORDER — BUPIVACAINE HCL (PF) 0.25 % IJ SOLN
INTRAMUSCULAR | Status: AC
  Filled 2019-01-13: qty 30

## 2019-01-13 MED ORDER — DEXAMETHASONE SODIUM PHOSPHATE 10 MG/ML IJ SOLN
INTRAMUSCULAR | Status: DC | PRN
  Administered 2019-01-13: 4 mg via INTRAVENOUS

## 2019-01-13 MED ORDER — SUGAMMADEX SODIUM 200 MG/2ML IV SOLN
INTRAVENOUS | Status: DC | PRN
  Administered 2019-01-13: 200 mg via INTRAVENOUS

## 2019-01-13 MED ORDER — ACETAMINOPHEN 10 MG/ML IV SOLN: 1000.0000 mg | Freq: Once | INTRAVENOUS | Status: DC | PRN

## 2019-01-13 MED ORDER — ROCURONIUM BROMIDE 10 MG/ML (PF) SYRINGE
PREFILLED_SYRINGE | INTRAVENOUS | Status: AC
  Filled 2019-01-13: qty 40

## 2019-01-13 MED ORDER — HEMOSTATIC AGENTS (NO CHARGE) OPTIME
TOPICAL | Status: DC | PRN
  Administered 2019-01-13: 1 via TOPICAL

## 2019-01-13 MED ORDER — SUCCINYLCHOLINE CHLORIDE 200 MG/10ML IV SOSY
PREFILLED_SYRINGE | INTRAVENOUS | Status: AC
  Filled 2019-01-13: qty 10

## 2019-01-13 MED ORDER — FENTANYL CITRATE (PF) 250 MCG/5ML IJ SOLN
INTRAMUSCULAR | Status: AC
  Filled 2019-01-13: qty 5

## 2019-01-13 SURGICAL SUPPLY — 45 items
APPLIER CLIP 5 13 M/L LIGAMAX5 (MISCELLANEOUS) ×2
BIOPATCH RED 1 DISK 7.0 (GAUZE/BANDAGES/DRESSINGS) ×2 IMPLANT
BLADE CLIPPER SURG (BLADE) IMPLANT
CANISTER SUCT 3000ML PPV (MISCELLANEOUS) ×2 IMPLANT
CHLORAPREP W/TINT 26 (MISCELLANEOUS) ×2 IMPLANT
CLIP APPLIE 5 13 M/L LIGAMAX5 (MISCELLANEOUS) ×1 IMPLANT
COVER SURGICAL LIGHT HANDLE (MISCELLANEOUS) ×2 IMPLANT
COVER WAND RF STERILE (DRAPES) ×2 IMPLANT
DERMABOND ADVANCED (GAUZE/BANDAGES/DRESSINGS) ×1
DERMABOND ADVANCED .7 DNX12 (GAUZE/BANDAGES/DRESSINGS) ×1 IMPLANT
DISSECTOR BLUNT TIP ENDO 5MM (MISCELLANEOUS) IMPLANT
DRAIN CHANNEL 19F RND (DRAIN) ×2 IMPLANT
DRSG TEGADERM 4X4.75 (GAUZE/BANDAGES/DRESSINGS) ×2 IMPLANT
ELECT CAUTERY BLADE 6.4 (BLADE) ×2 IMPLANT
ELECT REM PT RETURN 9FT ADLT (ELECTROSURGICAL) ×2
ELECTRODE REM PT RTRN 9FT ADLT (ELECTROSURGICAL) ×1 IMPLANT
EVACUATOR SILICONE 100CC (DRAIN) ×2 IMPLANT
GLOVE BIO SURGEON STRL SZ7.5 (GLOVE) ×2 IMPLANT
GLOVE INDICATOR 7.0 STRL GRN (GLOVE) ×2 IMPLANT
GLOVE INDICATOR 8.0 STRL GRN (GLOVE) ×2 IMPLANT
GOWN STRL REUS W/ TWL LRG LVL3 (GOWN DISPOSABLE) ×2 IMPLANT
GOWN STRL REUS W/ TWL XL LVL3 (GOWN DISPOSABLE) ×1 IMPLANT
GOWN STRL REUS W/TWL LRG LVL3 (GOWN DISPOSABLE) ×2
GOWN STRL REUS W/TWL XL LVL3 (GOWN DISPOSABLE) ×1
HEMOSTAT SNOW SURGICEL 2X4 (HEMOSTASIS) ×2 IMPLANT
KIT BASIN OR (CUSTOM PROCEDURE TRAY) ×2 IMPLANT
KIT TURNOVER KIT B (KITS) ×2 IMPLANT
NS IRRIG 1000ML POUR BTL (IV SOLUTION) ×2 IMPLANT
PAD ARMBOARD 7.5X6 YLW CONV (MISCELLANEOUS) ×2 IMPLANT
PENCIL SMOKE EVACUATOR (MISCELLANEOUS) ×2 IMPLANT
POUCH SPECIMEN RETRIEVAL 10MM (ENDOMECHANICALS) ×2 IMPLANT
SCISSORS LAP 5X35 DISP (ENDOMECHANICALS) ×2 IMPLANT
SET IRRIG TUBING LAPAROSCOPIC (IRRIGATION / IRRIGATOR) ×2 IMPLANT
SET TUBE SMOKE EVAC HIGH FLOW (TUBING) ×2 IMPLANT
SHEARS HARMONIC ACE PLUS 36CM (ENDOMECHANICALS) ×2 IMPLANT
SLEEVE ENDOPATH XCEL 5M (ENDOMECHANICALS) ×6 IMPLANT
SPECIMEN JAR SMALL (MISCELLANEOUS) ×2 IMPLANT
SUT ETHILON 2 0 FS 18 (SUTURE) ×2 IMPLANT
SUT MNCRL AB 4-0 PS2 18 (SUTURE) ×2 IMPLANT
TOWEL GREEN STERILE (TOWEL DISPOSABLE) ×2 IMPLANT
TOWEL GREEN STERILE FF (TOWEL DISPOSABLE) ×2 IMPLANT
TRAY LAPAROSCOPIC MC (CUSTOM PROCEDURE TRAY) ×2 IMPLANT
TROCAR XCEL BLUNT TIP 100MML (ENDOMECHANICALS) ×2 IMPLANT
TROCAR XCEL NON-BLD 5MMX100MML (ENDOMECHANICALS) ×2 IMPLANT
WATER STERILE IRR 1000ML POUR (IV SOLUTION) ×2 IMPLANT

## 2019-01-13 NOTE — Anesthesia Postprocedure Evaluation (Signed)
Anesthesia Post Note  Patient: Mark Bowen  Procedure(s) Performed: LAPAROSCOPIC SUBTOTAL CHOLECYSTECTOMY, WITH PLACEMENT OF DRAIN (N/A Abdomen)     Patient location during evaluation: PACU Anesthesia Type: General Level of consciousness: awake and alert Pain management: pain level controlled Vital Signs Assessment: post-procedure vital signs reviewed and stable Respiratory status: spontaneous breathing, nonlabored ventilation, respiratory function stable and patient connected to nasal cannula oxygen Cardiovascular status: blood pressure returned to baseline and stable Postop Assessment: no apparent nausea or vomiting Anesthetic complications: no    Last Vitals:  Vitals:   01/13/19 1243 01/13/19 1311  BP: 108/67 (!) 144/78  Pulse: 91 99  Resp: 18 20  Temp: 36.8 C 37.5 C  SpO2: 93% 95%    Last Pain:  Vitals:   01/13/19 1311  TempSrc: Oral  PainSc:                  [place]

## 2019-01-13 NOTE — Transfer of Care (Signed)
Immediate Anesthesia Transfer of Care Note  Patient: Mark Bowen  Procedure(s) Performed: LAPAROSCOPIC SUBTOTAL CHOLECYSTECTOMY, WITH PLACEMENT OF DRAIN (N/A Abdomen)  Patient Location: PACU  Anesthesia Type:General  Level of Consciousness: awake, alert  and oriented  Airway & Oxygen Therapy: Patient Spontanous Breathing and Patient connected to nasal cannula oxygen  Post-op Assessment: Report given to RN and Post -op Vital signs reviewed and stable  Post vital signs: Reviewed and stable  Last Vitals:  Vitals Value Taken Time  BP 129/68 01/13/19 1228  Temp    Pulse 98 01/13/19 1232  Resp 16 01/13/19 1232  SpO2 94 % 01/13/19 1232  Vitals shown include unvalidated device data.  Last Pain:  Vitals:   01/13/19 1228  TempSrc:   PainSc: (P) 0-No pain         Complications: No apparent anesthesia complications

## 2019-01-13 NOTE — Progress Notes (Signed)
Subjective No acute events. Feeling about the same. Denies complaints.  Objective: Vital signs in last 24 hours: Temp:  [98.5 F (36.9 C)-102.5 F (39.2 C)] 98.5 F (36.9 C) (01/05 0850) Pulse Rate:  [83-106] 83 (01/05 0850) Resp:  [12-21] 20 (01/05 0850) BP: (104-134)/(63-82) 126/68 (01/05 0850) SpO2:  [93 %-98 %] 93 % (01/05 0850) Last BM Date: 01/11/19  Intake/Output from previous day: 01/04 0701 - 01/05 0700 In: 2690.7 [P.O.:240; I.V.:1191; IV Piggyback:1259.7] Out: 1700 [Urine:1700] Intake/Output this shift: Total I/O In: 0  Out: 400 [Urine:400]  Gen: NAD, comfortable CV: RRR Pulm: Normal work of breathing Abd: Soft, focally ttp in RUQ. Nondistended. No rebound/guarding Ext: SCDs in place  Lab Results: CBC  Recent Labs    01/11/19 2323 01/13/19 0610  WBC 7.3 9.8  HGB 15.7 14.0  HCT 47.4 40.7  PLT 213 147*   BMET Recent Labs    01/11/19 2323 01/13/19 0610  NA 137 136  K 3.7 3.6  CL 99 104  CO2 29 24  GLUCOSE 153* 154*  BUN 11 8  CREATININE 0.97 0.91  CALCIUM 9.2 8.5*   PT/INR Recent Labs    01/12/19 1012  LABPROT 13.0  INR 1.0   ABG No results for input(s): PHART, HCO3 in the last 72 hours.  Invalid input(s): PCO2, PO2  Studies/Results:  Anti-infectives: Anti-infectives (From admission, onward)   Start     Dose/Rate Route Frequency Ordered Stop   01/12/19 1430  [MAR Hold]  piperacillin-tazobactam (ZOSYN) IVPB 3.375 g     (MAR Hold since Tue 01/13/2019 at 0953.Hold Reason: Transfer to a Procedural area.)   3.375 g 12.5 mL/hr over 240 Minutes Intravenous Every 8 hours 01/12/19 1421     01/12/19 1015  cefTRIAXone (ROCEPHIN) 2 g in sodium chloride 0.9 % 100 mL IVPB     2 g 200 mL/hr over 30 Minutes Intravenous  Once 01/12/19 1012 01/12/19 1156       Assessment/Plan: Patient Active Problem List   Diagnosis Date Noted  . Acute cholecystitis 01/12/2019  . HLD (hyperlipidemia) 06/10/2016  . Sepsis ([place]) 06/10/2016  . Diabetes mellitus  without complication ([place]) [id number]  . Fever   . DIVERTICULITIS-COLON 01/08/2008  . CHOLELITHIASIS 01/08/2008  . PERSONAL HX COLONIC POLYPS 01/08/2008  . DIABETES MELLITUS, TYPE I, CONTROLLED 12/31/2007  . Essential hypertension 12/31/2007  . GERD 12/31/2007  . ARTHRITIS 12/31/2007  . HEMORRHOIDS, INTERNAL 05/02/2006  . DIVERTICULOSIS, COLON 05/02/2006   Mr. Semelsberger is a very pleasant 83yoM hx HTN, HLD, DM, GERD - admitted with acute cholecystitis  -Planning OR today for cholecystectomy, possible subtotal cholecystectomy - see H&P for details regarding this discussion   LOS: 0 days   Sharon Mt. Dema Severin, M.D. [facility] Use [email or web address] to contact on call provider

## 2019-01-13 NOTE — Op Note (Signed)
01/13/2019 12:20 PM  PATIENT: Mark Bowen  84 y.o. male  Patient Care Team: [name], MD as PCP - General (Internal Medicine)  PRE-OPERATIVE DIAGNOSIS: Acute cholecystitis  POST-OPERATIVE DIAGNOSIS: 1. Acute on chronic cholecystitis; 2. Gangrenous cholecystitis  PROCEDURE:  Laparoscopic subtotal cholecystectomy  SURGEON: Sharon Mt. Airyana Sprunger, MD  ANESTHESIA: General endotracheal  EBL: Total I/O In: 800 [I.V.:800] Out: 58 [Urine:400; Blood:5]  DRAINS: 52 Fr round blake drain placed in gallbladder fosssa  SPECIMEN: Gallbladder wall with gallstone  COUNTS: Sponge, needle and instrument counts were reported correct x2 at the conclusion of the operation  DISPOSITION: PACU in satisfactory condition  COMPLICATIONS: None  FINDINGS: Gallbladder not visible on initial inspection as it was covered in omentum. Tense, gangrenous cholecystitis superimposed on chronic cholecystitis. Significant woody friable scar tissue approaching the triangle of [place] precluding safe dissection without increased risk of injury to surrounding structures. Therefore, decision was made to proceed with subtotal cholecystectomy. A single large gallstone was found in the neck and removed with the gallbladder wall. Gallbladder was intentionally left open and a drain placed so as not to reconstitute the gallbladder  INDICATION: [name] is an 64yoM with history of HTN, HLD, DM, GERD who presented to the emergency room and was found to have acute cholecystitis. He was admitted and placed on antibiotics. We had discussed options moving forward and he opted to pursue surgery. Please refer to notes elsewhere for details regarding this discussion.  DESCRIPTION:  The patient was identified & brought into the operating room. He was then positioned supine on the OR table. SCDs were in place and active during the entire case. He then underwent general endotracheal anesthesia. Pressure points were padded. Hair on the  abdomen was clipped by the OR team. The abdomen was prepped and draped in the standard sterile fashion. Antibiotics were administered. A surgical timeout was performed and confirmed our plan.   An infraumbilical incision was made. The umbilical stalk was grasped and retracted outwardly. The infraumbilical fascia was identified and incised. The peritoneal cavity was gently entered bluntly. A purse-string 0 Vicryl suture was placed. The Hasson cannula was inserted into the peritoneal cavity and insufflation with CO2 commenced to 52mmHg. A laparoscope was inserted into the peritoneal cavity and inspection confirmed no evidence of trocar site complications. The patient was then positioned in reverse Trendelenburg with slight left side down. 3 additional 54mm trocars were placed along the right subcostal line - one 43mm port in mid subcostal region, another 34mm port in the right flank near the anterior axillary line, and a third 62mm port in the left subxiphoid region obliquely near the falciform ligament.  The right upper quadrant was inspected.  Initially, the gallbladder not visible as it was covered in omentum.  The omentum was carefully separated in the gallbladder identified.  This was tense and had gangrenous changes to the wall.  The gallbladder was unable to be grasped due to how tense it was therefore it was aspirated with a laparoscopic suction irrigator and needle.  After decompression, the wall the gallbladder fundus was grasped and elevated cephalad. An additional grasper was then placed on the infundibulum of the gallbladder and the infundibulum was retracted laterally. Staying high on the gallbladder, the peritoneum on both sides of the gallbladder was opened with hook cautery. Gentle blunt dissection was then employed.  There was significant woody, chronically inflamed appearing and friable tissue working down towards close triangle.  This precluded safe dissection without increasing risk of injury to  surrounding hepatobiliary structures.  Therefore, dissection was not attempted.  Attention was therefore turned to performing a subtotal cholecystectomy.  Staying high up on the gallbladder and using a harmonic scalpel, the gallbladder was opened.  Bile was aspirated.  There was a single gallstone visible in the neck well below where the gallbladder had been opened.  The gallbladder wall was then taken off.  In doing this, approximately two thirds of the gallbladder was removed.  This was all done using the harmonic scalpel.  Hemostasis was verified.  The liver bed was then fulgurated using electrocautery.  The gallbladder was inspected and there is no obvious bile leak at this point.  The gallstone had been removed.  An Endo Catch bag was placed and the gallbladder wall as well as the gallstones were placed within the bag.  This was then removed through the umbilical trocar site.  The RUQ was irrigated with sterile saline. Hemostasis was verified. Surgicel snow was placed in the gallbladder fossa. The rest of the abdomen was inspected no injury nor bleeding elsewhere was identified.  A photo was taken.  A 19 French round Blake drain was then placed through the lateralmost abdominal trocar site on the right hemiabdomen.  This was secured using a 2-0 nylon suture.  A drain was placed such that it was appropriately drained in the gallbladder fossa and gallbladder remnant which was intentionally left open.  This was done so as not to reconstitute the gallbladder.  The RUQ ports were removed under direct visualization and noted to be hemostatic. The umbilical fascia was then closed using the 0 Vicryl purse-string suture. The fascia was palpated and noted to be completely closed. The skin of all incision sites was approximated with 4-0 monocryl subcuticular suture and dermabond applied.  A drain site dressing was placed.  The drain was attached to bulb suction.  He was then awakened from anesthesia, extubated, and  transferred to a stretcher for transport to PACU in satisfactory condition.  I have attempted to update both his wife and children but have been unsuccessful in reaching them, having reached generic voicemails.

## 2019-01-13 NOTE — Anesthesia Procedure Notes (Signed)
Procedure Name: Intubation Date/Time: 01/13/2019 11:03 AM Performed by: Inda Coke, CRNA Pre-anesthesia Checklist: Patient identified, Emergency Drugs available, Suction available and Patient being monitored Patient Re-evaluated:Patient Re-evaluated prior to induction Oxygen Delivery Method: Circle System Utilized Preoxygenation: Pre-oxygenation with 100% oxygen Induction Type: IV induction Ventilation: Mask ventilation without difficulty Laryngoscope Size: Mac and 4 Grade View: Grade I Tube type: Oral Tube size: 7.5 mm Number of attempts: 1 Airway Equipment and Method: Stylet and Oral airway Placement Confirmation: ETT inserted through vocal cords under direct vision,  positive ETCO2 and breath sounds checked- equal and bilateral Secured at: 22 cm Tube secured with: Tape Dental Injury: Teeth and Oropharynx as per pre-operative assessment

## 2019-01-13 NOTE — Plan of Care (Signed)

## 2019-01-14 ENCOUNTER — Encounter (HOSPITAL_COMMUNITY): Admission: EM | Disposition: A | Discharge status: Home / Self Care | Payer: Self-pay | Source: Home / Self Care

## 2019-01-14 ENCOUNTER — Inpatient Hospital Stay (HOSPITAL_COMMUNITY): Payer: Medicare Other | Admitting: Anesthesiology

## 2019-01-14 ENCOUNTER — Inpatient Hospital Stay (HOSPITAL_COMMUNITY): Payer: Medicare Other

## 2019-01-14 DIAGNOSIS — K9189 Other postprocedural complications and disorders of digestive system: Secondary | ICD-10-CM

## 2019-01-14 DIAGNOSIS — K839 Disease of biliary tract, unspecified: Secondary | ICD-10-CM

## 2019-01-14 DIAGNOSIS — K838 Other specified diseases of biliary tract: Secondary | ICD-10-CM

## 2019-01-14 HISTORY — PX: BILIARY STENT PLACEMENT: SHX5538

## 2019-01-14 HISTORY — PX: ERCP: SHX5425

## 2019-01-14 HISTORY — PX: SPHINCTEROTOMY: SHX5544

## 2019-01-14 LAB — COMPREHENSIVE METABOLIC PANEL
ALT: 47 U/L — ABNORMAL HIGH (ref 0–44)
AST: 44 U/L — ABNORMAL HIGH (ref 15–41)
Albumin: 2.7 g/dL — ABNORMAL LOW (ref 3.5–5.0)
Alkaline Phosphatase: 56 U/L (ref 38–126)
Anion gap: 8 (ref 5–15)
BUN: 9 mg/dL (ref 8–23)
CO2: 26 mmol/L (ref 22–32)
Calcium: 8.7 mg/dL — ABNORMAL LOW (ref 8.9–10.3)
Chloride: 105 mmol/L (ref 98–111)
Creatinine, Ser: 1.01 mg/dL (ref 0.61–1.24)
GFR calc Af Amer: >60 mL/min (ref >60)
GFR calc non Af Amer: >60 mL/min (ref >60)
Glucose, Bld: 175 mg/dL — ABNORMAL HIGH (ref 70–99)
Potassium: 4 mmol/L (ref 3.5–5.1)
Sodium: 139 mmol/L (ref 135–145)
Total Bilirubin: 0.8 mg/dL (ref 0.3–1.2)
Total Protein: 5.3 g/dL — ABNORMAL LOW (ref 6.5–8.1)

## 2019-01-14 LAB — CBC
HCT: 42.1 % (ref 39.0–52.0)
Hemoglobin: 14.2 g/dL (ref 13.0–17.0)
MCH: 29.3 pg (ref 26.0–34.0)
MCHC: 33.7 g/dL (ref 30.0–36.0)
MCV: 87 fL (ref 80.0–100.0)
Platelets: 142 10*3/uL — ABNORMAL LOW (ref 150–400)
RBC: 4.84 MIL/uL (ref 4.22–5.81)
RDW: 13.4 % (ref 11.5–15.5)
WBC: 8.7 10*3/uL (ref 4.0–10.5)
nRBC: 0 % (ref 0.0–0.2)

## 2019-01-14 LAB — GLUCOSE, CAPILLARY
Glucose-Capillary: 123 mg/dL — ABNORMAL HIGH (ref 70–99)
Glucose-Capillary: 104 mg/dL — ABNORMAL HIGH (ref 70–99)
Glucose-Capillary: 198 mg/dL — ABNORMAL HIGH (ref 70–99)
Glucose-Capillary: 272 mg/dL — ABNORMAL HIGH (ref 70–99)

## 2019-01-14 LAB — SURGICAL PATHOLOGY

## 2019-01-14 SURGERY — ERCP, WITH INTERVENTION IF INDICATED
Anesthesia: General

## 2019-01-14 MED ORDER — FENTANYL CITRATE (PF) 250 MCG/5ML IJ SOLN
INTRAMUSCULAR | Status: DC | PRN
  Administered 2019-01-14 (×2): 25 ug via INTRAVENOUS

## 2019-01-14 MED ORDER — LIDOCAINE 2% (20 MG/ML) 5 ML SYRINGE
INTRAMUSCULAR | Status: DC | PRN
  Administered 2019-01-14: 60 mg via INTRAVENOUS

## 2019-01-14 MED ORDER — SODIUM CHLORIDE 0.9 % IV SOLN
INTRAVENOUS | Status: DC | PRN
  Administered 2019-01-14: 16:00:00 30 mL

## 2019-01-14 MED ORDER — INDOMETHACIN 50 MG RE SUPP
RECTAL | Status: DC | PRN
  Administered 2019-01-14: 100 mg via RECTAL

## 2019-01-14 MED ORDER — FENTANYL CITRATE (PF) 100 MCG/2ML IJ SOLN
INTRAMUSCULAR | Status: AC
  Filled 2019-01-14: qty 2

## 2019-01-14 MED ORDER — GLUCAGON HCL RDNA (DIAGNOSTIC) 1 MG IJ SOLR
INTRAMUSCULAR | Status: DC | PRN
  Administered 2019-01-14: .5 mg via INTRAVENOUS

## 2019-01-14 MED ORDER — INDOMETHACIN 50 MG RE SUPP
RECTAL | Status: AC
  Filled 2019-01-14: qty 2

## 2019-01-14 MED ORDER — PROPOFOL 10 MG/ML IV BOLUS
INTRAVENOUS | Status: DC | PRN
  Administered 2019-01-14: 30 mg via INTRAVENOUS

## 2019-01-14 MED ORDER — DEXAMETHASONE SODIUM PHOSPHATE 10 MG/ML IJ SOLN
INTRAMUSCULAR | Status: DC | PRN
  Administered 2019-01-14: 5 mg via INTRAVENOUS

## 2019-01-14 MED ORDER — GLUCAGON HCL RDNA (DIAGNOSTIC) 1 MG IJ SOLR
INTRAMUSCULAR | Status: AC
  Filled 2019-01-14: qty 1

## 2019-01-14 MED ORDER — SUCCINYLCHOLINE CHLORIDE 200 MG/10ML IV SOSY
PREFILLED_SYRINGE | INTRAVENOUS | Status: DC | PRN
  Administered 2019-01-14: 80 mg via INTRAVENOUS

## 2019-01-14 MED ORDER — EPHEDRINE SULFATE-NACL 50-0.9 MG/10ML-% IV SOSY
PREFILLED_SYRINGE | INTRAVENOUS | Status: DC | PRN
  Administered 2019-01-14: 10 mg via INTRAVENOUS

## 2019-01-14 MED ORDER — ONDANSETRON HCL 4 MG/2ML IJ SOLN
INTRAMUSCULAR | Status: DC | PRN
  Administered 2019-01-14: 4 mg via INTRAVENOUS

## 2019-01-14 NOTE — Interval H&P Note (Signed)
History and Physical Interval Note:  01/14/2019 2:13 PM  Mark Bowen  has presented today for surgery, with the diagnosis of Bile leak.  The various methods of treatment have been discussed with the patient and family. After consideration of risks, benefits and other options for treatment, the patient has consented to  Procedure(s): ENDOSCOPIC RETROGRADE CHOLANGIOPANCREATOGRAPHY (ERCP) (N/A) as a surgical intervention.  The patient's history has been reviewed, patient examined, no change in status, stable for surgery.  I have reviewed the patient's chart and labs.  Questions were answered to the patient's satisfaction.     Scarlette Shorts

## 2019-01-14 NOTE — Op Note (Signed)
[facility] Patient Name: Mark Bowen Procedure Date : 01/14/2019 MRN: [id number] Attending MD: Docia Chuck. Henrene Pastor , MD Date of Birth: 29-Dec-1935 CSN: [id number] Age: 84 Admit Type: Inpatient Procedure:                ERCP with sphincterotomy and bile duct stent                            placement (covered SEMS) Indications:              Bile leak Providers:                Docia Chuck. Henrene Pastor, MD, Angus Seller, Waynette Buttery., Technician Referring MD:             Nadeen Landau, MD [facility] surgery) Medicines:                General Anesthesia Complications:            No immediate complications. Estimated Blood Loss:     Estimated blood loss: none. Procedure:                Pre-Anesthesia Assessment:                           - Prophylactic Antibiotics: The patient requires                            prophylactic antibiotics as clinically indicated                            based on published guidelines for the planned ERCP.                            The patient received antibiotic therapy before the                            procedure.                           After obtaining informed consent, the scope was                            passed under direct vision. Throughout the                            procedure, the patient's blood pressure, pulse, and                            oxygen saturations were monitored continuously. The                            [id number]) [place] was                            introduced through the mouth, and used to inject  contrast into and used to inject contrast into the                            bile duct and ventral pancreatic duct. Scope In: Scope Out: Findings:      1. Esophagus was not examined. The stomach and duodenum were grossly       normal. There were [place] diverticula in the second portion of the       duodenum. The major  ampulla was normal. The minor ampulla was not sought.      2. Scout radiograph of the abdomen with the endoscope in position       revealed recently placed percutaneous biliary drain      3. Cannulation was deemed difficult (nipple the ampulla, long position)       but was achieved after easy placement of an 035 guidewire in the       pancreatic duct (opacified twice). After the second guidewire was placed       into the biliary tree the pancreatic wire was removed. Injection of       contrast into the biliary tree revealed gaping bile leak in the region       of the cystic duct/gallbladder remnant. The biliary system was       nondilated. There were no filling defects.      4. A biliary sphincterotomy was made using the ERBE system. The       sphincterotomy size was moderate with cutting and 12:00 orientation. No       bleeding      5. One 10 mm by 6 cm covered metal stent was placed into the common bile       duct. Bile flowed through the stent. The stent was in good position with       the proximal portion above the leak and the distal portion intraduodenal       (see image)      . Impression:               1. Esophagus was not examined. The stomach and                            duodenum were grossly normal. There were [place]                            diverticula in the second portion of the duodenum.                            The major ampulla was normal. The minor ampulla was                            not sought.                           2. Scout radiograph of the abdomen with the                            endoscope in position revealed recently placed                            percutaneous  biliary drain                           3. Cannulation was deemed difficult (nipple the                            ampulla, long position) but was achieved after easy                            placement of an 035 guidewire in the pancreatic                            duct (opacified  twice). After the second guidewire                            was placed into the biliary tree the pancreatic                            wire was removed. Injection of contrast into the                            biliary tree revealed gaping bile leak in the                            region of the cystic duct/gallbladder remnant. The                            biliary system was nondilated. There were no                            filling defects.                           4. A biliary sphincterotomy was made using the ERBE                            system. The sphincterotomy size was moderate with                            cutting and 12:00 orientation. No bleeding                           5. One 10 millimeters in diameter by 6 cm covered                            metal stent was placed into the common bile duct.                            Bile flowed through the stent. The stent was in                            good position with the proximal portion above the  leak and the distal portion intraduodenal (see                            image)1.. Moderate Sedation:      none Recommendation:           1. Standard post ERCP care. The patient will                            receive indomethacin rectal suppositories as well                            as lactated Ringer solution                           2. Return to the hospital ward. Standard clinical                            observation                           3. When bowel leak resolved, then remove biliary                            stent (approximately 6 weeks).                           4. GI will follow Procedure Code(s):        --- Professional ---                           [phone], Endoscopic retrograde                            cholangiopancreatography (ERCP); with placement of                            endoscopic stent into biliary or pancreatic duct,                            including pre-  and post-dilation and guide wire                            passage, when performed, including sphincterotomy,                            when performed, each stent Diagnosis Code(s):        --- Professional ---                           K83.8, Other specified diseases of biliary tract CPT copyright 2019 American Medical Association. All rights reserved. The codes documented in this report are preliminary and upon coder review may  be revised to meet current compliance requirements. Docia Chuck. Henrene Pastor, MD 01/14/2019 4:00:51 PM This report has been signed electronically. Number of Addenda: 0

## 2019-01-14 NOTE — Anesthesia Preprocedure Evaluation (Addendum)
Anesthesia Evaluation  Patient identified by MRN, date of birth, ID band Patient awake    Reviewed: Allergy & Precautions, NPO status , Patient's Chart, lab work & pertinent test results  Airway Mallampati: II  TM Distance: >3 FB Neck ROM: Full    Dental no notable dental hx. (+) Dental Advisory Given, Edentulous Upper   Pulmonary neg pulmonary ROS, former smoker,    Pulmonary exam normal breath sounds clear to auscultation       Cardiovascular hypertension, Pt. on medications Normal cardiovascular exam Rhythm:Regular Rate:Normal     Neuro/Psych negative neurological ROS  negative psych ROS   GI/Hepatic Neg liver ROS, GERD  Medicated,  Endo/Other  diabetes, Type 2  Renal/GU negative Renal ROS  negative genitourinary   Musculoskeletal negative musculoskeletal ROS (+)   Abdominal   Peds negative pediatric ROS (+)  Hematology negative hematology ROS (+)   Anesthesia Other Findings   Reproductive/Obstetrics negative OB ROS                            Anesthesia Physical Anesthesia Plan  ASA: II  Anesthesia Plan: General   Post-op Pain Management:    Induction: Intravenous  PONV Risk Score and Plan: 2 and Ondansetron, Dexamethasone and Treatment may vary due to age or medical condition  Airway Management Planned: Oral ETT  Additional Equipment:   Intra-op Plan:   Post-operative Plan: Extubation in OR  Informed Consent: I have reviewed the patients History and Physical, chart, labs and discussed the procedure including the risks, benefits and alternatives for the proposed anesthesia with the patient or authorized representative who has indicated his/her understanding and acceptance.     Dental advisory given  Plan Discussed with: CRNA and Surgeon  Anesthesia Plan Comments:         Anesthesia Quick Evaluation

## 2019-01-14 NOTE — Progress Notes (Signed)
[facility] Progress Note  1 Day Post-Op  Subjective: CC-  Feeling much better this morning. Abdomen sore but pain is better. Tolerated clears last night. Denies n/v. No flatus or BM. Ambulating to bathroom without issues.  JP drain with bilious output.  Objective: Vital signs in last 24 hours: Temp:  [97.5 F (36.4 C)-99.5 F (37.5 C)] 97.5 F (36.4 C) (01/06 0531) Pulse Rate:  [56-99] 56 (01/06 0531) Resp:  [13-20] 18 (01/06 0531) BP: (103-144)/(65-78) 103/65 (01/06 0531) SpO2:  [92 %-96 %] 96 % (01/06 0531) Last BM Date: 01/11/19  Intake/Output from previous day: 01/05 0701 - 01/06 0700 In: 3392.5 [P.O.:1020; I.V.:2225.6; IV Piggyback:146.9] Out: 2630 [Urine:2075; Drains:550; Blood:5] Intake/Output this shift: No intake/output data recorded.  PE: Gen:  Alert, NAD, pleasant HEENT: EOM's intact, pupils equal and round Pulm:  Rate and effort normal Abd: Soft, ND, appropriately tender, few BS heard, lap incisions C/D/I, drain with bilious output in bulb Psych: A&Ox3  Skin: no rashes noted, warm and dry  Lab Results:  Recent Labs    01/13/19 0610 01/14/19 0129  WBC 9.8 8.7  HGB 14.0 14.2  HCT 40.7 42.1  PLT 147* 142*   BMET Recent Labs    01/13/19 0610 01/14/19 0129  NA 136 139  K 3.6 4.0  CL 104 105  CO2 24 26  GLUCOSE 154* 175*  BUN 8 9  CREATININE 0.91 1.01  CALCIUM 8.5* 8.7*   PT/INR Recent Labs    01/12/19 1012  LABPROT 13.0  INR 1.0   CMP     Component Value Date/Time   NA 139 01/14/2019 0129   K 4.0 01/14/2019 0129   CL 105 01/14/2019 0129   CO2 26 01/14/2019 0129   GLUCOSE 175 (H) 01/14/2019 0129   BUN 9 01/14/2019 0129   CREATININE 1.01 01/14/2019 0129   CALCIUM 8.7 (L) 01/14/2019 0129   PROT 5.3 (L) 01/14/2019 0129   ALBUMIN 2.7 (L) 01/14/2019 0129   AST 44 (H) 01/14/2019 0129   ALT 47 (H) 01/14/2019 0129   ALKPHOS 56 01/14/2019 0129   BILITOT 0.8 01/14/2019 0129   GFRNONAA >60 01/14/2019 0129   GFRAA >60  01/14/2019 0129   Lipase     Component Value Date/Time   LIPASE 23 01/11/2019 2323       Studies/Results: US Abdomen Limited  Result Date: 01/12/2019 CLINICAL DATA:  Upper abdominal pain EXAM: ULTRASOUND ABDOMEN LIMITED RIGHT UPPER QUADRANT COMPARISON:  None. FINDINGS: Gallbladder: There is a 1.3 cm calculus adherent in the neck of the gallbladder. The gallbladder wall is thickened with pericholecystic fluid. There is tenderness focally over the gallbladder. Common bile duct: Diameter: 4 mm. No intrahepatic or extrahepatic biliary duct dilatation. Liver: No focal lesion identified. Within normal limits in parenchymal echogenicity. Portal vein is patent on color Doppler imaging with normal direction of blood flow towards the liver. Other: None. IMPRESSION: There is a gallstone imbedded in the neck of the gallbladder with gallbladder wall thickening, pericholecystic fluid, and focal tenderness over the gallbladder. These are findings indicative of acute cholecystitis. Study otherwise unremarkable. Electronically Signed   By: Lowella Grip III M.D.   On: 01/12/2019 12:34    Anti-infectives: Anti-infectives (From admission, onward)   Start     Dose/Rate Route Frequency Ordered Stop   01/12/19 1430  piperacillin-tazobactam (ZOSYN) IVPB 3.375 g     3.375 g 12.5 mL/hr over 240 Minutes Intravenous Every 8 hours 01/12/19 1421     01/12/19 1015  cefTRIAXone (ROCEPHIN)  2 g in sodium chloride 0.9 % 100 mL IVPB     2 g 200 mL/hr over 30 Minutes Intravenous  Once 01/12/19 1012 01/12/19 1156       Assessment/Plan HTN - home meds DM - SSI HLD GERD H/o prostate cancer s/p surgery  Acute on chronic cholecystitis, Gangrenous cholecystitis S/p Laparoscopic subtotal cholecystectomy 1/5 Dr. Dema Severin - POD#1 - Bile noted in JP drain, will ask GI to see for suspected bile leak. Keep NPO until seen by GI. Plan to continue antibiotics x1 week postop. Mobilize.   ID - rocephin x1. Zosyn 1/4>> VTE -  SCDs, lovenox FEN - IVF, NPO until seen by GI Foley - none Follow up - Dr. Dema Severin   LOS: 1 day    Wellington Hampshire, [facility] 01/14/2019, 8:02 AM Please see Amion for pager number during day hours 7:00am-4:30pm

## 2019-01-14 NOTE — Consult Note (Addendum)
Consultation  Referring Provider: [facility]/ Dr Dema Severin Primary Care Physician:  [name], MD Primary Gastroenterologist:  Dr.Arelene Moroni  Reason for Consultation: Bile leak  HPI: Mark Bowen is a 84 y.o. male, known remotely to Dr. Henrene Pastor from previous colonoscopies with history of adenomatous polyps, who we are asked to see today for bile leak. Patient was admitted on 01/12/2019 with 12-hour history of acute severe 10/10 right upper quadrant pain radiating through to his back.  This was associated with nausea.  Work-up in the emergency room with upper abdominal ultrasound showed a 1.3 cm stone in the neck of the gallbladder and gallbladder wall thickening as well as pericholecystic fluid consistent with acute cholecystitis.  CBD 4 mm. Labs on admit WBC of 7.3, hemoglobin 15, liver function studies within normal limits.  Patient was started on IV Zosyn and underwent laparoscopic subtotal cholecystectomy yesterday with finding of gangrenous cholecystitis.  The gallbladder remnant was left open and a 19 [place] Blake drain was placed into the gallbladder fossa, Patient has done well postoperatively and says he is feeling much better today. Today WBC 8.7, hemoglobin 14.2, T bili 0.8/AST 44/ALT 47. He is draining overt bile from the JP.  Patient is on subcu Lovenox at nighttime. Other medical problems include adult onset diabetes mellitus, hypertension, GERD, and history of prostate CA.   Past Medical History:  Diagnosis Date  . Acute cholecystitis 01/2019  . Arthritis   . Diabetes mellitus 2007   type 2  . Diverticulitis   . GERD (gastroesophageal reflux disease)   . Hypercholesterolemia since 2008 preventative   taking Zocor, now cholesterol WNL  . Hypertension since 1970  . Prostate CA ([place])   . Shingles     Past Surgical History:  Procedure Laterality Date  . APPENDECTOMY  1960  . [place] & 2009  . CHOLECYSTECTOMY N/A 01/13/2019   Procedure: LAPAROSCOPIC SUBTOTAL  CHOLECYSTECTOMY, WITH PLACEMENT OF DRAIN;  Surgeon: Ileana Roup, MD;  Location: [facility];  Service: General;  Laterality: N/A;  . [place]   bilateral  . KNEE SURGERY Right 11/2018  . PROSTATE SURGERY    . TONSILLECTOMY  1960    Prior to Admission medications   Medication Sig Start Date End Date Taking? Authorizing Provider  acetaminophen (TYLENOL) 325 MG tablet Take 2 tablets (650 mg total) by mouth every 6 (six) hours as needed for mild pain, fever or headache. 06/13/16  Yes Reyne Dumas, MD  aspirin 81 MG tablet Take 81 mg by mouth daily.   Yes [provider]  benazepril (LOTENSIN) 20 MG tablet Take 1 tablet (20 mg total) by mouth daily. 06/20/16  Yes Reyne Dumas, MD  fish oil-omega-3 fatty acids 1000 MG capsule Take 1 g by mouth daily.    Yes [provider]  omeprazole (PRILOSEC) 20 MG capsule Take 20 mg by mouth daily.   Yes [provider]  simvastatin (ZOCOR) 40 MG tablet Take 20 mg by mouth every morning.   Yes [provider]  sitaGLIPtan-metformin (JANUMET) 50-1000 MG per tablet Take 1 tablet by mouth 2 (two) times daily with a meal.   Yes [provider]  gabapentin (NEURONTIN) 600 MG tablet Take 0.5 tablets (300 mg total) by mouth 2 (two) times daily. 06/15/16 07/15/16  Reyne Dumas, MD    Current Facility-Administered Medications  Medication Dose Route Frequency Provider Last Rate Last Admin  . acetaminophen (TYLENOL) tablet 650 mg  650 mg Oral Q6H PRN  Jillyn Ledger, PA-C   650 mg at 01/13/19 [id number]   Or  . acetaminophen (TYLENOL) suppository 650 mg  650 mg Rectal Q6H PRN Jillyn Ledger, PA-C      . benazepril (LOTENSIN) tablet 20 mg  20 mg Oral Daily Jillyn Ledger, PA-C   20 mg at 01/13/19 1934  . diphenhydrAMINE (BENADRYL) 12.5 MG/5ML elixir 12.5 mg  12.5 mg Oral Q6H PRN Maczis, Barth Kirks, PA-C       Or  . diphenhydrAMINE (BENADRYL) injection 12.5 mg  12.5 mg Intravenous Q6H PRN Maczis, Barth Kirks, PA-C      . docusate sodium (COLACE) capsule 100 mg  100 mg Oral BID Jillyn Ledger, PA-C   100 mg at 01/13/19 2222  . enoxaparin (LOVENOX) injection 40 mg  40 mg Subcutaneous Q24H Jillyn Ledger, PA-C   40 mg at 01/13/19 2212  . insulin aspart (novoLOG) injection 0-15 Units  0-15 Units Subcutaneous TID WC Jillyn Ledger, PA-C   2 Units at 01/14/19 0753  . insulin aspart (novoLOG) injection 0-5 Units  0-5 Units Subcutaneous QHS Jillyn Ledger, PA-C   2 Units at 01/13/19 2211  . lactated ringers infusion   Intravenous Continuous Jillyn Ledger, PA-C 75 mL/hr at 01/14/19 0500 Rate Verify at 01/14/19 0500  . methocarbamol (ROBAXIN) tablet 500 mg  500 mg Oral Q6H PRN Jillyn Ledger, PA-C   500 mg at 01/14/19 [id number]  . metoprolol tartrate (LOPRESSOR) injection 5 mg  5 mg Intravenous Q6H PRN Maczis, Barth Kirks, PA-C      . morphine 2 MG/ML injection 1-2 mg  1-2 mg Intravenous Q3H PRN Jillyn Ledger, PA-C   2 mg at 01/13/19 1923  . ondansetron (ZOFRAN-ODT) disintegrating tablet 4 mg  4 mg Oral Q6H PRN Maczis, Barth Kirks, PA-C       Or  . ondansetron [facility]) injection 4 mg  4 mg Intravenous Q6H PRN Maczis, Barth Kirks, PA-C      . oxyCODONE (Oxy IR/ROXICODONE) immediate release tablet 5-10 mg  5-10 mg Oral Q4H PRN Jillyn Ledger, PA-C   10 mg at 01/14/19 0317  . pantoprazole (PROTONIX) injection 40 mg  40 mg Intravenous QHS Jillyn Ledger, PA-C   40 mg at 01/13/19 2210  . piperacillin-tazobactam (ZOSYN) IVPB 3.375 g  3.375 g Intravenous Q8H Jillyn Ledger, PA-C 12.5 mL/hr at 01/14/19 0537 3.375 g at 01/14/19 0537  . polyethylene glycol (MIRALAX / GLYCOLAX) packet 17 g  17 g Oral Daily PRN Maczis, Barth Kirks, PA-C      . simethicone [facility]) chewable tablet 40 mg  40 mg Oral Q6H PRN Maczis, Barth Kirks, PA-C      . simvastatin (ZOCOR) tablet 20 mg  20 mg Oral q morning - 10a Jillyn Ledger, PA-C   20 mg at 01/13/19 1353    Allergies as of 01/11/2019 - Review Complete  01/11/2019  Allergen Reaction Noted  . Iodine  04/02/2016  . Iohexol  07/23/2006    Family History  Problem Relation Age of Onset  . Lung cancer Father   . Prostate cancer Brother        x 3 brothers  . Colon cancer Neg Hx   . Stomach cancer Neg Hx     Social History   Socioeconomic History  . Marital status: Married    Spouse name: Not on file  . Number of children: Not on file  . Years of education: Not on file  .  Highest education level: Not on file  Occupational History  . Not on file  Tobacco Use  . Smoking status: Former Smoker    Quit date: 04/04/1959    Years since quitting: 59.8  . Smokeless tobacco: Never Used  Substance and Sexual Activity  . Alcohol use: No  . Drug use: No  . Sexual activity: Not on file  Other Topics Concern  . Not on file  Social History Narrative  . Not on file   Social Determinants of Health   Financial Resource Strain:   . Difficulty of Paying Living Expenses: Not on file  Food Insecurity:   . Worried About Charity fundraiser in the Last Year: Not on file  . Ran Out of Food in the Last Year: Not on file  Transportation Needs:   . Lack of Transportation (Medical): Not on file  . Lack of Transportation (Non-Medical): Not on file  Physical Activity:   . Days of Exercise per Week: Not on file  . Minutes of Exercise per Session: Not on file  Stress:   . Feeling of Stress : Not on file  Social Connections:   . Frequency of Communication with Friends and Family: Not on file  . Frequency of Social Gatherings with Friends and Family: Not on file  . Attends Religious Services: Not on file  . Active Member of Clubs or Organizations: Not on file  . Attends Archivist Meetings: Not on file  . Marital Status: Not on file  Intimate Partner Violence:   . Fear of Current or Ex-Partner: Not on file  . Emotionally Abused: Not on file  . Physically Abused: Not on file  . Sexually Abused: Not on file    Review of Systems:  Pertinent positive and negative review of systems were noted in the above HPI section.  All other review of systems was otherwise negative.  Physical Exam: Vital signs in last 24 hours: Temp:  [97.5 F (36.4 C)-99.5 F (37.5 C)] 97.5 F (36.4 C) (01/06 0531) Pulse Rate:  [56-99] 56 (01/06 0531) Resp:  [13-20] 18 (01/06 0531) BP: (103-144)/(65-78) 103/65 (01/06 0531) SpO2:  [92 %-96 %] 96 % (01/06 0531) Last BM Date: 01/11/19 General:   Alert,  Well-developed, well-nourished, elderly white male pleasant and cooperative in NAD Head:  Normocephalic and atraumatic. Eyes:  Sclera clear, no icterus.   Conjunctiva pink. Ears:  Normal auditory acuity. Nose:  No deformity, discharge,  or lesions. Mouth:  No deformity or lesions.   Neck:  Supple; no masses or thyromegaly. Lungs:  Clear throughout to auscultation.   No wheezes, crackles, or rhonchi. Heart:  Regular rate and rhythm; no murmurs, clicks, rubs,  or gallops. Abdomen:  Soft, mildly tender across the upper abdomen BS active,nonpalp mass or hsm..  JP in the right upper quadrant with golden bilious output Rectal:  Deferred  Msk:  Symmetrical without gross deformities. . Pulses:  Normal pulses noted. Extremities:  Without clubbing or edema. Neurologic:  Alert and  oriented x4;  grossly normal neurologically. Skin:  Intact without significant lesions or rashes.. Psych:  Alert and cooperative. Normal mood and affect.  Intake/Output from previous day: 01/05 0701 - 01/06 0700 In: 3392.5 [P.O.:1020; I.V.:2225.6; IV Piggyback:146.9] Out: 2630 [Urine:2075; Drains:550; Blood:5] Intake/Output this shift: No intake/output data recorded.  Lab Results: Recent Labs    01/11/19 2323 01/13/19 0610 01/14/19 0129  WBC 7.3 9.8 8.7  HGB 15.7 14.0 14.2  HCT 47.4 40.7 42.1  PLT 213 147*  142*   BMET Recent Labs    01/11/19 2323 01/13/19 0610 01/14/19 0129  NA 137 136 139  K 3.7 3.6 4.0  CL 99 104 105  CO2 29 24 26   GLUCOSE 153* 154*  175*  BUN 11 8 9   CREATININE 0.97 0.91 1.01  CALCIUM 9.2 8.5* 8.7*   LFT Recent Labs    01/14/19 0129  PROT 5.3*  ALBUMIN 2.7*  AST 44*  ALT 47*  ALKPHOS 56  BILITOT 0.8   PT/INR Recent Labs    01/12/19 1012  LABPROT 13.0  INR 1.0   Hepatitis Panel No results for input(s): HEPBSAG, HCVAB, HEPAIGM, HEPBIGM in the last 72 hours.    IMPRESSION:  #53 84 year old white male with bile leak, status post subtotal cholecystectomy yesterday for acute gangrenous cholecystitis.  Biliary drain was placed at the time of surgery, and he has been draining overt bile. Patient is feeling much better postoperatively, afebrile, and normal WBC. On IV Zosyn  #2 history of adenomatous colon polyps #3.  Adult onset diabetes mellitus #4.  Hypertension #5.  History of prostate CA  Plan; Keep n.p.o. Continue IV Zosyn Patient will be scheduled for ERCP with stent placement with Dr. Scarlette Shorts.  Procedure was discussed in detail with the patient including indications risks and benefits and he is agreeable to proceed.  Procedure has been scheduled for this afternoon.  Thank you will follow with you    Amy Esterwood PA-C 01/14/2019, 9:11 AM  GI ATTENDING  History, laboratories, x-rays, operative note reviewed.  Patient personally seen and examined.  Agree with comprehensive consultation note as outlined above.  Patient has a postoperative bile leak after subtotal cholecystectomy yesterday for acute gangrenous cholecystitis.  Clinically stable and feeling better.  No significant laboratory abnormalities.  Abdominal exam benign except for incisional tenderness.  JP drain with serosanguineous fluid and bile.  He is on IV antibiotics.  He is now for ERCP with biliary stent placement.  High risk by nature of the procedure.The nature of the procedure, as well as the risks (including pancreatitis, bleeding, perforation, infection, and failed cannulation as the most common), benefits, and alternatives were  carefully and thoroughly reviewed with the patient. Ample time for discussion and questions allowed. The patient understood, was satisfied, and agreed to proceed.  Docia Chuck. Geri Seminole., M.D. [facility] Division of Gastroenterology

## 2019-01-14 NOTE — Anesthesia Postprocedure Evaluation (Signed)
Anesthesia Post Note  Patient: Mark Bowen  Procedure(s) Performed: ENDOSCOPIC RETROGRADE CHOLANGIOPANCREATOGRAPHY (ERCP) (N/A ) [place]     Patient location during evaluation: PACU Anesthesia Type: General Level of consciousness: awake and alert Pain management: pain level controlled Vital Signs Assessment: post-procedure vital signs reviewed and stable Respiratory status: spontaneous breathing, nonlabored ventilation, respiratory function stable and patient connected to nasal cannula oxygen Cardiovascular status: blood pressure returned to baseline and stable Postop Assessment: no apparent nausea or vomiting Anesthetic complications: no    Last Vitals:  Vitals:   01/14/19 1600 01/14/19 1610  BP: 140/79 (!) 160/62  Pulse: 83 78  Resp: 17 (!) 22  Temp:    SpO2: 99% 93%    Last Pain:  Vitals:   01/14/19 1551  TempSrc: Oral  PainSc: 0-No pain                 Samyria Rudie S

## 2019-01-14 NOTE — Plan of Care (Signed)

## 2019-01-14 NOTE — Transfer of Care (Signed)
Immediate Anesthesia Transfer of Care Note  Patient: Mark Bowen  Procedure(s) Performed: ENDOSCOPIC RETROGRADE CHOLANGIOPANCREATOGRAPHY (ERCP) (N/A )  Patient Location: Endoscopy Unit  Anesthesia Type:General  Level of Consciousness: drowsy and patient cooperative  Airway & Oxygen Therapy: Patient Spontanous Breathing and Patient connected to face mask oxygen  Post-op Assessment: Report given to RN and Post -op Vital signs reviewed and stable  Post vital signs: Reviewed and stable  Last Vitals:  Vitals Value Taken Time  BP    Temp    Pulse 92 01/14/19 1550  Resp 18 01/14/19 1550  SpO2 96 % 01/14/19 1550  Vitals shown include unvalidated device data.  Last Pain:  Vitals:   01/14/19 1321  TempSrc: Oral  PainSc: 4          Complications: No apparent anesthesia complications

## 2019-01-14 NOTE — Anesthesia Procedure Notes (Signed)

## 2019-01-14 NOTE — H&P (View-Only) (Signed)
Consultation  Referring Provider: [facility]/ Dr Dema Severin Primary Care Physician:  [name], MD Primary Gastroenterologist:  Dr.Olga Bourbeau  Reason for Consultation: Bile leak  HPI: Mark Bowen is a 84 y.o. male, known remotely to Dr. Henrene Pastor from previous colonoscopies with history of adenomatous polyps, who we are asked to see today for bile leak. Patient was admitted on 01/12/2019 with 12-hour history of acute severe 10/10 right upper quadrant pain radiating through to his back.  This was associated with nausea.  Work-up in the emergency room with upper abdominal ultrasound showed a 1.3 cm stone in the neck of the gallbladder and gallbladder wall thickening as well as pericholecystic fluid consistent with acute cholecystitis.  CBD 4 mm. Labs on admit WBC of 7.3, hemoglobin 15, liver function studies within normal limits.  Patient was started on IV Zosyn and underwent laparoscopic subtotal cholecystectomy yesterday with finding of gangrenous cholecystitis.  The gallbladder remnant was left open and a 19 [place] Blake drain was placed into the gallbladder fossa, Patient has done well postoperatively and says he is feeling much better today. Today WBC 8.7, hemoglobin 14.2, T bili 0.8/AST 44/ALT 47. He is draining overt bile from the JP.  Patient is on subcu Lovenox at nighttime. Other medical problems include adult onset diabetes mellitus, hypertension, GERD, and history of prostate CA.   Past Medical History:  Diagnosis Date  . Acute cholecystitis 01/2019  . Arthritis   . Diabetes mellitus 2007   type 2  . Diverticulitis   . GERD (gastroesophageal reflux disease)   . Hypercholesterolemia since 2008 preventative   taking Zocor, now cholesterol WNL  . Hypertension since 1970  . Prostate CA ([place])   . Shingles     Past Surgical History:  Procedure Laterality Date  . APPENDECTOMY  1960  . [place] & 2009  . CHOLECYSTECTOMY N/A 01/13/2019   Procedure: LAPAROSCOPIC SUBTOTAL  CHOLECYSTECTOMY, WITH PLACEMENT OF DRAIN;  Surgeon: Ileana Roup, MD;  Location: [facility];  Service: General;  Laterality: N/A;  . Agency   bilateral  . KNEE SURGERY Right 11/2018  . PROSTATE SURGERY    . TONSILLECTOMY  1960    Prior to Admission medications   Medication Sig Start Date End Date Taking? Authorizing Provider  acetaminophen (TYLENOL) 325 MG tablet Take 2 tablets (650 mg total) by mouth every 6 (six) hours as needed for mild pain, fever or headache. 06/13/16  Yes Reyne Dumas, MD  aspirin 81 MG tablet Take 81 mg by mouth daily.   Yes [provider]  benazepril (LOTENSIN) 20 MG tablet Take 1 tablet (20 mg total) by mouth daily. 06/20/16  Yes Reyne Dumas, MD  fish oil-omega-3 fatty acids 1000 MG capsule Take 1 g by mouth daily.    Yes [provider]  omeprazole (PRILOSEC) 20 MG capsule Take 20 mg by mouth daily.   Yes [provider]  simvastatin (ZOCOR) 40 MG tablet Take 20 mg by mouth every morning.   Yes [provider]  sitaGLIPtan-metformin (JANUMET) 50-1000 MG per tablet Take 1 tablet by mouth 2 (two) times daily with a meal.   Yes [provider]  gabapentin (NEURONTIN) 600 MG tablet Take 0.5 tablets (300 mg total) by mouth 2 (two) times daily. 06/15/16 07/15/16  Reyne Dumas, MD    Current Facility-Administered Medications  Medication Dose Route Frequency Provider Last Rate Last Admin  . acetaminophen (TYLENOL) tablet 650 mg  650 mg Oral Q6H PRN  Jillyn Ledger, PA-C   650 mg at 01/13/19 [id number]   Or  . acetaminophen (TYLENOL) suppository 650 mg  650 mg Rectal Q6H PRN Jillyn Ledger, PA-C      . benazepril (LOTENSIN) tablet 20 mg  20 mg Oral Daily Jillyn Ledger, PA-C   20 mg at 01/13/19 1934  . diphenhydrAMINE (BENADRYL) 12.5 MG/5ML elixir 12.5 mg  12.5 mg Oral Q6H PRN Maczis, Barth Kirks, PA-C       Or  . diphenhydrAMINE (BENADRYL) injection 12.5 mg  12.5 mg Intravenous Q6H PRN Maczis, Barth Kirks, PA-C      . docusate sodium (COLACE) capsule 100 mg  100 mg Oral BID Jillyn Ledger, PA-C   100 mg at 01/13/19 2222  . enoxaparin (LOVENOX) injection 40 mg  40 mg Subcutaneous Q24H Jillyn Ledger, PA-C   40 mg at 01/13/19 2212  . insulin aspart (novoLOG) injection 0-15 Units  0-15 Units Subcutaneous TID WC Jillyn Ledger, PA-C   2 Units at 01/14/19 0753  . insulin aspart (novoLOG) injection 0-5 Units  0-5 Units Subcutaneous QHS Jillyn Ledger, PA-C   2 Units at 01/13/19 2211  . lactated ringers infusion   Intravenous Continuous Jillyn Ledger, PA-C 75 mL/hr at 01/14/19 0500 Rate Verify at 01/14/19 0500  . methocarbamol (ROBAXIN) tablet 500 mg  500 mg Oral Q6H PRN Jillyn Ledger, PA-C   500 mg at 01/14/19 [id number]  . metoprolol tartrate (LOPRESSOR) injection 5 mg  5 mg Intravenous Q6H PRN Maczis, Barth Kirks, PA-C      . morphine 2 MG/ML injection 1-2 mg  1-2 mg Intravenous Q3H PRN Jillyn Ledger, PA-C   2 mg at 01/13/19 1923  . ondansetron (ZOFRAN-ODT) disintegrating tablet 4 mg  4 mg Oral Q6H PRN Maczis, Barth Kirks, PA-C       Or  . ondansetron [facility]) injection 4 mg  4 mg Intravenous Q6H PRN Maczis, Barth Kirks, PA-C      . oxyCODONE (Oxy IR/ROXICODONE) immediate release tablet 5-10 mg  5-10 mg Oral Q4H PRN Jillyn Ledger, PA-C   10 mg at 01/14/19 0317  . pantoprazole (PROTONIX) injection 40 mg  40 mg Intravenous QHS Jillyn Ledger, PA-C   40 mg at 01/13/19 2210  . piperacillin-tazobactam (ZOSYN) IVPB 3.375 g  3.375 g Intravenous Q8H Jillyn Ledger, PA-C 12.5 mL/hr at 01/14/19 0537 3.375 g at 01/14/19 0537  . polyethylene glycol (MIRALAX / GLYCOLAX) packet 17 g  17 g Oral Daily PRN Maczis, Barth Kirks, PA-C      . simethicone [facility]) chewable tablet 40 mg  40 mg Oral Q6H PRN Maczis, Barth Kirks, PA-C      . simvastatin (ZOCOR) tablet 20 mg  20 mg Oral q morning - 10a Jillyn Ledger, PA-C   20 mg at 01/13/19 1353    Allergies as of 01/11/2019 - Review Complete  01/11/2019  Allergen Reaction Noted  . Iodine  04/02/2016  . Iohexol  07/23/2006    Family History  Problem Relation Age of Onset  . Lung cancer Father   . Prostate cancer Brother        x 3 brothers  . Colon cancer Neg Hx   . Stomach cancer Neg Hx     Social History   Socioeconomic History  . Marital status: Married    Spouse name: Not on file  . Number of children: Not on file  . Years of education: Not on file  .  Highest education level: Not on file  Occupational History  . Not on file  Tobacco Use  . Smoking status: Former Smoker    Quit date: 04/04/1959    Years since quitting: 59.8  . Smokeless tobacco: Never Used  Substance and Sexual Activity  . Alcohol use: No  . Drug use: No  . Sexual activity: Not on file  Other Topics Concern  . Not on file  Social History Narrative  . Not on file   Social Determinants of Health   Financial Resource Strain:   . Difficulty of Paying Living Expenses: Not on file  Food Insecurity:   . Worried About Charity fundraiser in the Last Year: Not on file  . Ran Out of Food in the Last Year: Not on file  Transportation Needs:   . Lack of Transportation (Medical): Not on file  . Lack of Transportation (Non-Medical): Not on file  Physical Activity:   . Days of Exercise per Week: Not on file  . Minutes of Exercise per Session: Not on file  Stress:   . Feeling of Stress : Not on file  Social Connections:   . Frequency of Communication with Friends and Family: Not on file  . Frequency of Social Gatherings with Friends and Family: Not on file  . Attends Religious Services: Not on file  . Active Member of Clubs or Organizations: Not on file  . Attends Archivist Meetings: Not on file  . Marital Status: Not on file  Intimate Partner Violence:   . Fear of Current or Ex-Partner: Not on file  . Emotionally Abused: Not on file  . Physically Abused: Not on file  . Sexually Abused: Not on file    Review of  Systems: Pertinent positive and negative review of systems were noted in the above HPI section.  All other review of systems was otherwise negative.  Physical Exam: Vital signs in last 24 hours: Temp:  [97.5 F (36.4 C)-99.5 F (37.5 C)] 97.5 F (36.4 C) (01/06 0531) Pulse Rate:  [56-99] 56 (01/06 0531) Resp:  [13-20] 18 (01/06 0531) BP: (103-144)/(65-78) 103/65 (01/06 0531) SpO2:  [92 %-96 %] 96 % (01/06 0531) Last BM Date: 01/11/19 General:   Alert,  Well-developed, well-nourished, elderly white male pleasant and cooperative in NAD Head:  Normocephalic and atraumatic. Eyes:  Sclera clear, no icterus.   Conjunctiva pink. Ears:  Normal auditory acuity. Nose:  No deformity, discharge,  or lesions. Mouth:  No deformity or lesions.   Neck:  Supple; no masses or thyromegaly. Lungs:  Clear throughout to auscultation.   No wheezes, crackles, or rhonchi. Heart:  Regular rate and rhythm; no murmurs, clicks, rubs,  or gallops. Abdomen:  Soft, mildly tender across the upper abdomen BS active,nonpalp mass or hsm..  JP in the right upper quadrant with golden bilious output Rectal:  Deferred  Msk:  Symmetrical without gross deformities. . Pulses:  Normal pulses noted. Extremities:  Without clubbing or edema. Neurologic:  Alert and  oriented x4;  grossly normal neurologically. Skin:  Intact without significant lesions or rashes.. Psych:  Alert and cooperative. Normal mood and affect.  Intake/Output from previous day: 01/05 0701 - 01/06 0700 In: 3392.5 [P.O.:1020; I.V.:2225.6; IV Piggyback:146.9] Out: 2630 [Urine:2075; Drains:550; Blood:5] Intake/Output this shift: No intake/output data recorded.  Lab Results: Recent Labs    01/11/19 2323 01/13/19 0610 01/14/19 0129  WBC 7.3 9.8 8.7  HGB 15.7 14.0 14.2  HCT 47.4 40.7 42.1  PLT 213 147*  142*   BMET Recent Labs    01/11/19 2323 01/13/19 0610 01/14/19 0129  NA 137 136 139  K 3.7 3.6 4.0  CL 99 104 105  CO2 29 24 26   GLUCOSE  153* 154* 175*  BUN 11 8 9   CREATININE 0.97 0.91 1.01  CALCIUM 9.2 8.5* 8.7*   LFT Recent Labs    01/14/19 0129  PROT 5.3*  ALBUMIN 2.7*  AST 44*  ALT 47*  ALKPHOS 56  BILITOT 0.8   PT/INR Recent Labs    01/12/19 1012  LABPROT 13.0  INR 1.0   Hepatitis Panel No results for input(s): HEPBSAG, HCVAB, HEPAIGM, HEPBIGM in the last 72 hours.    IMPRESSION:  #36 84 year old white male with bile leak, status post subtotal cholecystectomy yesterday for acute gangrenous cholecystitis.  Biliary drain was placed at the time of surgery, and he has been draining overt bile. Patient is feeling much better postoperatively, afebrile, and normal WBC. On IV Zosyn  #2 history of adenomatous colon polyps #3.  Adult onset diabetes mellitus #4.  Hypertension #5.  History of prostate CA  Plan; Keep n.p.o. Continue IV Zosyn Patient will be scheduled for ERCP with stent placement with Dr. Scarlette Shorts.  Procedure was discussed in detail with the patient including indications risks and benefits and he is agreeable to proceed.  Procedure has been scheduled for this afternoon.  Thank you will follow with you    Amy Esterwood PA-C 01/14/2019, 9:11 AM  GI ATTENDING  History, laboratories, x-rays, operative note reviewed.  Patient personally seen and examined.  Agree with comprehensive consultation note as outlined above.  Patient has a postoperative bile leak after subtotal cholecystectomy yesterday for acute gangrenous cholecystitis.  Clinically stable and feeling better.  No significant laboratory abnormalities.  Abdominal exam benign except for incisional tenderness.  JP drain with serosanguineous fluid and bile.  He is on IV antibiotics.  He is now for ERCP with biliary stent placement.  High risk by nature of the procedure.The nature of the procedure, as well as the risks (including pancreatitis, bleeding, perforation, infection, and failed cannulation as the most common), benefits, and  alternatives were carefully and thoroughly reviewed with the patient. Ample time for discussion and questions allowed. The patient understood, was satisfied, and agreed to proceed.  Docia Chuck. Geri Seminole., M.D. [facility] Division of Gastroenterology

## 2019-01-15 DIAGNOSIS — K839 Disease of biliary tract, unspecified: Secondary | ICD-10-CM

## 2019-01-15 DIAGNOSIS — R1013 Epigastric pain: Secondary | ICD-10-CM

## 2019-01-15 LAB — GLUCOSE, CAPILLARY
Glucose-Capillary: 120 mg/dL — ABNORMAL HIGH (ref 70–99)
Glucose-Capillary: 145 mg/dL — ABNORMAL HIGH (ref 70–99)
Glucose-Capillary: 164 mg/dL — ABNORMAL HIGH (ref 70–99)
Glucose-Capillary: 115 mg/dL — ABNORMAL HIGH (ref 70–99)

## 2019-01-15 LAB — CBC
HCT: 36.4 % — ABNORMAL LOW (ref 39.0–52.0)
Hemoglobin: 12.6 g/dL — ABNORMAL LOW (ref 13.0–17.0)
MCH: 29.6 pg (ref 26.0–34.0)
MCHC: 34.6 g/dL (ref 30.0–36.0)
MCV: 85.6 fL (ref 80.0–100.0)
Platelets: 148 10*3/uL — ABNORMAL LOW (ref 150–400)
RBC: 4.25 MIL/uL (ref 4.22–5.81)
RDW: 13.1 % (ref 11.5–15.5)
WBC: 7.8 10*3/uL (ref 4.0–10.5)
nRBC: 0 % (ref 0.0–0.2)

## 2019-01-15 LAB — COMPREHENSIVE METABOLIC PANEL
ALT: 38 U/L (ref 0–44)
AST: 23 U/L (ref 15–41)
Albumin: 2.4 g/dL — ABNORMAL LOW (ref 3.5–5.0)
Alkaline Phosphatase: 51 U/L (ref 38–126)
Anion gap: 7 (ref 5–15)
BUN: 11 mg/dL (ref 8–23)
CO2: 27 mmol/L (ref 22–32)
Calcium: 8.3 mg/dL — ABNORMAL LOW (ref 8.9–10.3)
Chloride: 107 mmol/L (ref 98–111)
Creatinine, Ser: 0.99 mg/dL (ref 0.61–1.24)
GFR calc Af Amer: >60 mL/min (ref >60)
GFR calc non Af Amer: >60 mL/min (ref >60)
Glucose, Bld: 201 mg/dL — ABNORMAL HIGH (ref 70–99)
Potassium: 3.8 mmol/L (ref 3.5–5.1)
Sodium: 141 mmol/L (ref 135–145)
Total Bilirubin: 0.3 mg/dL (ref 0.3–1.2)
Total Protein: 4.8 g/dL — ABNORMAL LOW (ref 6.5–8.1)

## 2019-01-15 LAB — LIPASE, BLOOD: Lipase: 383 U/L — ABNORMAL HIGH (ref 11–51)

## 2019-01-15 LAB — MAGNESIUM: Magnesium: 1.9 mg/dL (ref 1.7–2.4)

## 2019-01-15 MED ORDER — PANTOPRAZOLE SODIUM 40 MG PO TBEC
40.0000 mg | DELAYED_RELEASE_TABLET | Freq: Every day | ORAL | Status: DC
  Administered 2019-01-15 – 2019-01-16 (×2): 40 mg via ORAL
  Filled 2019-01-15: qty 1

## 2019-01-15 MED ORDER — ZOLPIDEM TARTRATE 5 MG PO TABS
5.0000 mg | ORAL_TABLET | Freq: Every evening | ORAL | Status: DC | PRN
  Administered 2019-01-15: 5 mg via ORAL
  Filled 2019-01-15: qty 1

## 2019-01-15 NOTE — Progress Notes (Addendum)
Daily Rounding Note  01/15/2019, 8:54 AM  LOS: 2 days   SUBJECTIVE:   Chief complaint:  Bile duct leak, s/p ERCP/stent.   Correct pain in the epigastrium last night.  It has come back this morning.  No nausea or vomiting.  Tolerated clear liquids last night, just about to start his full liquid breakfast this morning.  Had bowel movement yesterday. Drainage into the JP bulb is less dark patient observation  OBJECTIVE:         Vital signs in last 24 hours:    Temp:  [97.8 F (36.6 C)-98.5 F (36.9 C)] 97.8 F (36.6 C) (01/07 0534) Pulse Rate:  [54-89] 57 (01/07 0534) Resp:  [14-23] 14 (01/07 0534) BP: (114-167)/(62-96) 152/82 (01/07 0534) SpO2:  [93 %-99 %] 95 % (01/07 0534) Weight:  [73.5 kg] 73.5 kg (01/06 1321) Last BM Date: 01/14/19 Filed Weights   01/14/19 1321  Weight: 73.5 kg   General: Observed walking in the hall unassisted, pushing IV pole.  Looks well.  Comfortable. Heart: RRR Chest: Clear bilaterally.  No dyspnea.  No cough. Abdomen: Soft, nonobese.  Tender from the epigastrium over into the right upper quadrant.  No guarding or rebound.  Fluid in JP drain looks reddish, clear with a slight brown discoloration.  Active bowel sounds. Extremities: No CCE. Neuro/Psych: Pleasant, cooperative.  No gross deficits.  Intake/Output from previous day: 01/06 0701 - 01/07 0700 In: [place] [P.O.:920; I.V.:800; IV Piggyback:50] Out: 2095 [Urine:1775; Drains:310; Blood:10]  Intake/Output this shift: Total I/O In: 300 [P.O.:300] Out: 500 [Urine:500]  Lab Results: Recent Labs    01/13/19 0610 01/14/19 0129 01/15/19 0303  WBC 9.8 8.7 7.8  HGB 14.0 14.2 12.6*  HCT 40.7 42.1 36.4*  PLT 147* 142* 148*   BMET Recent Labs    01/13/19 0610 01/14/19 0129 01/15/19 0303  NA 136 139 141  K 3.6 4.0 3.8  CL 104 105 107  CO2 24 26 27   GLUCOSE 154* 175* 201*  BUN 8 9 11   CREATININE 0.91 1.01 0.99  CALCIUM 8.5* 8.7*  8.3*   LFT Recent Labs    01/13/19 0610 01/14/19 0129 01/15/19 0303  PROT 5.2* 5.3* 4.8*  ALBUMIN 2.8* 2.7* 2.4*  AST 22 44* 23  ALT 26 47* 38  ALKPHOS 60 56 51  BILITOT 1.1 0.8 0.3   PT/INR Recent Labs    01/12/19 1012  LABPROT 13.0  INR 1.0   Hepatitis Panel No results for input(s): HEPBSAG, HCVAB, HEPAIGM, HEPBIGM in the last 72 hours.  Studies/Results: DG ERCP BILIARY & PANCREATIC DUCTS  Result Date: 01/14/2019 CLINICAL DATA:  History of cholecystectomy on 01/13/2019, now with concern for bile leak EXAM: ERCP TECHNIQUE: Multiple spot images obtained with the fluoroscopic device and submitted for interpretation post-procedure. COMPARISON:  Right upper quadrant abdominal ultrasound-01/12/2019 FLUOROSCOPY TIME:  14 minutes, 16 seconds FINDINGS: Thirteen spot intraoperative fluoroscopic images of the right upper abdominal quadrant during ERCP are provided for review Initial image demonstrates an ERCP probe overlying the right upper abdominal quadrant. A surgical drainage catheter overlies the right upper abdominal quadrant. Subsequent images demonstrate selective cannulation and opacification of the pancreatic duct which appears nondilated. Subsequent images demonstrate selective cannulation and opacification of the common bile duct with extravasation of administered contrast about the residual cystic duct and gallbladder fossa. Completion images demonstrate placement of an internal biliary stent traversing the expected location of the confluence with the cystic duct. IMPRESSION: ERCP with findings worrisome  for cystic duct remnant bile leak with subsequent internal biliary stent placement. These images were submitted for radiologic interpretation only. Please see the procedural report for the amount of contrast and the fluoroscopy time utilized. Electronically Signed   By: Sandi Mariscal M.D.   On: 01/14/2019 15:58   Scheduled Meds: . benazepril  20 mg Oral Daily  . docusate sodium  100  mg Oral BID  . enoxaparin (LOVENOX) injection  40 mg Subcutaneous Q24H  . insulin aspart  0-15 Units Subcutaneous TID WC  . insulin aspart  0-5 Units Subcutaneous QHS  . pantoprazole (PROTONIX) IV  40 mg Intravenous QHS  . simvastatin  20 mg Oral q morning - 10a   Continuous Infusions: . lactated ringers 75 mL/hr at 01/14/19 2019  . piperacillin-tazobactam (ZOSYN)  IV 3.375 g (01/15/19 0527)   PRN Meds:.acetaminophen **OR** acetaminophen, diphenhydrAMINE **OR** diphenhydrAMINE, methocarbamol, metoprolol tartrate, morphine injection, ondansetron **OR** ondansetron (ZOFRAN) IV, oxyCODONE, polyethylene glycol, simethicone   ASSESMENT:   *  Bile leak after subtotal cholecystectomy 01/14/19 ERCP with covered metal stent placed to CBD.   Lipase 383.  LFTs normal.  New epigastric pain, may be due to mild post ercp pancreatitis (though elevated lipase routinely seen post ERCP) more byproduct of his presenting gangrenous cholecystitis, surgery, and drain placement.  In any event, looks well.  *   PLAN   *   Advance diet as tolerated, leave on fulls for now.    *   Leave LR running at 75 mL/hour.  Oxycodone prn.  Switch to po Protonix.    *   remove stent ~ 2/17 (6 weeks)  Azucena Freed  01/15/2019, 8:54 AM Phone [phone]  GI ATTENDING  Interval history and data reviewed.  Patient seen and examined.  Agree with interval progress note as outlined.  Patient doing well post ERCP.  Minimal epigastric pain as noted.  Looks well.  Routine postoperative management per general surgery.  Outpatient general surgery follow-up.  If it is felt that bile leak has resolved, then plan outpatient stent removal in about 6 weeks or so.  My office will contact the patient to arrange office visit with me prior, in about 4 weeks.  Call for questions or problems.  We will sign off.  Docia Chuck. Geri Seminole., M.D. [facility] Division of Gastroenterology

## 2019-01-15 NOTE — Plan of Care (Signed)

## 2019-01-15 NOTE — Progress Notes (Addendum)
[facility]/Trauma Progress Note  1 Day Post-Op   Assessment/Plan HTN - home meds DM - SSI HLD GERD H/o prostate cancer s/p surgery  Acute on chronic cholecystitis, Gangrenous cholecystitis - S/p Laparoscopic subtotal cholecystectomy 1/5 Dr. Dema Severin - Bile leak s/p ERCP with sphincterotomy and bile duct stent - lipase 383 this am, post ERCP pancreatitis, complaining of epigastric abd pain - recheck labs am - will be discharged with drain  ID -rocephin x1. Zosyn 1/4>>  DC on Cipro/flagyl for 10T days of abx VTE -SCDs, lovenox FEN -IVF, FLD, PO protonix  Foley -none Follow up -Dr. Dema Severin, [facility] in 6 weeks for stent removal  DISPO: recheck labs am. Possible dc tomorrow     LOS: 2 days    Subjective: CC: epigastric abdominal pain  Tolerating a FLD. No issues overnight. Discussed going home with drain.   Objective: Vital signs in last 24 hours: Temp:  [97.8 F (36.6 C)-98.5 F (36.9 C)] 97.8 F (36.6 C) (01/07 0534) Pulse Rate:  [54-89] 57 (01/07 0534) Resp:  [14-23] 14 (01/07 0534) BP: (114-167)/(62-96) 152/82 (01/07 0534) SpO2:  [93 %-99 %] 95 % (01/07 0534) Weight:  [73.5 kg] 73.5 kg (01/06 1321) Last BM Date: 01/14/19  Intake/Output from previous day: 01/06 0701 - 01/07 0700 In: [id number] [P.O.:920; I.V.:800; IV Piggyback:50] Out: 2095 [Urine:1775; Drains:310; Blood:10] Intake/Output this shift: Total I/O In: 300 [P.O.:300] Out: 500 [Urine:500]  PE:  Gen:  Alert, NAD, pleasant, cooperative Pulm:  Rate and effort normal Abd: Soft, NT/ND, incisions with glue intact are well appearing and are without signs of infection, drain is sanguinous  Skin: no rashes noted, warm and dry   Anti-infectives: Anti-infectives (From admission, onward)   Start     Dose/Rate Route Frequency Ordered Stop   01/12/19 1430  piperacillin-tazobactam (ZOSYN) IVPB 3.375 g     3.375 g 12.5 mL/hr over 240 Minutes Intravenous Every 8 hours 01/12/19 1421     01/12/19 1015  cefTRIAXone (ROCEPHIN) 2 g in sodium chloride 0.9 % 100 mL IVPB     2 g 200 mL/hr over 30 Minutes Intravenous  Once 01/12/19 1012 01/12/19 1156      Lab Results:  Recent Labs    01/14/19 0129 01/15/19 0303  WBC 8.7 7.8  HGB 14.2 12.6*  HCT 42.1 36.4*  PLT 142* 148*   BMET Recent Labs    01/14/19 0129 01/15/19 0303  NA 139 141  K 4.0 3.8  CL 105 107  CO2 26 27  GLUCOSE 175* 201*  BUN 9 11  CREATININE 1.01 0.99  CALCIUM 8.7* 8.3*   Recent Labs    01/12/19 1012  LABPROT 13.0  INR 1.0   CMP     Component Value Date/Time   NA 141 01/15/2019 0303   K 3.8 01/15/2019 0303   CL 107 01/15/2019 0303   CO2 27 01/15/2019 0303   GLUCOSE 201 (H) 01/15/2019 0303   BUN 11 01/15/2019 0303   CREATININE 0.99 01/15/2019 0303   CALCIUM 8.3 (L) 01/15/2019 0303   PROT 4.8 (L) 01/15/2019 0303   ALBUMIN 2.4 (L) 01/15/2019 0303   AST 23 01/15/2019 0303   ALT 38 01/15/2019 0303   ALKPHOS 51 01/15/2019 0303   BILITOT 0.3 01/15/2019 0303   GFRNONAA >60 01/15/2019 0303   GFRAA >60 01/15/2019 0303   Lipase     Component Value Date/Time   LIPASE 383 (H) 01/15/2019 0303    Studies/Results: DG ERCP BILIARY & PANCREATIC DUCTS  Result Date:  01/14/2019 CLINICAL DATA:  History of cholecystectomy on 01/13/2019, now with concern for bile leak EXAM: ERCP TECHNIQUE: Multiple spot images obtained with the fluoroscopic device and submitted for interpretation post-procedure. COMPARISON:  Right upper quadrant abdominal ultrasound-01/12/2019 FLUOROSCOPY TIME:  14 minutes, 16 seconds FINDINGS: Thirteen spot intraoperative fluoroscopic images of the right upper abdominal quadrant during ERCP are provided for review Initial image demonstrates an ERCP probe overlying the right upper abdominal quadrant. A surgical drainage catheter overlies the right upper abdominal quadrant. Subsequent images demonstrate selective cannulation and opacification of the pancreatic duct which appears  nondilated. Subsequent images demonstrate selective cannulation and opacification of the common bile duct with extravasation of administered contrast about the residual cystic duct and gallbladder fossa. Completion images demonstrate placement of an internal biliary stent traversing the expected location of the confluence with the cystic duct. IMPRESSION: ERCP with findings worrisome for cystic duct remnant bile leak with subsequent internal biliary stent placement. These images were submitted for radiologic interpretation only. Please see the procedural report for the amount of contrast and the fluoroscopy time utilized. Electronically Signed   By: Sandi Mariscal M.D.   On: 01/14/2019 15:58     Kalman Drape, [facility] Please see amion for pager for the following: Cristine Polio, & Friday 7:00am - 4:30pm Thursdays 7:00am -11:30am

## 2019-01-16 LAB — COMPREHENSIVE METABOLIC PANEL
ALT: 37 U/L (ref 0–44)
AST: 20 U/L (ref 15–41)
Albumin: 2.5 g/dL — ABNORMAL LOW (ref 3.5–5.0)
Alkaline Phosphatase: 60 U/L (ref 38–126)
Anion gap: 9 (ref 5–15)
BUN: 6 mg/dL — ABNORMAL LOW (ref 8–23)
CO2: 25 mmol/L (ref 22–32)
Calcium: 8 mg/dL — ABNORMAL LOW (ref 8.9–10.3)
Chloride: 104 mmol/L (ref 98–111)
Creatinine, Ser: 1.05 mg/dL (ref 0.61–1.24)
GFR calc Af Amer: >60 mL/min (ref >60)
GFR calc non Af Amer: >60 mL/min (ref >60)
Glucose, Bld: 134 mg/dL — ABNORMAL HIGH (ref 70–99)
Potassium: 3.4 mmol/L — ABNORMAL LOW (ref 3.5–5.1)
Sodium: 138 mmol/L (ref 135–145)
Total Bilirubin: 0.7 mg/dL (ref 0.3–1.2)
Total Protein: 5.1 g/dL — ABNORMAL LOW (ref 6.5–8.1)

## 2019-01-16 LAB — CBC
HCT: 39.6 % (ref 39.0–52.0)
Hemoglobin: 13.2 g/dL (ref 13.0–17.0)
MCH: 28.9 pg (ref 26.0–34.0)
MCHC: 33.3 g/dL (ref 30.0–36.0)
MCV: 86.7 fL (ref 80.0–100.0)
Platelets: 173 10*3/uL (ref 150–400)
RBC: 4.57 MIL/uL (ref 4.22–5.81)
RDW: 13.2 % (ref 11.5–15.5)
WBC: 6.3 10*3/uL (ref 4.0–10.5)
nRBC: 0 % (ref 0.0–0.2)

## 2019-01-16 LAB — GLUCOSE, CAPILLARY: Glucose-Capillary: 98 mg/dL (ref 70–99)

## 2019-01-16 LAB — LIPASE, BLOOD: Lipase: 64 U/L — ABNORMAL HIGH (ref 11–51)

## 2019-01-16 MED ORDER — SACCHAROMYCES BOULARDII 250 MG PO CAPS: 250.0000 mg | ORAL_CAPSULE | Freq: Two times a day (BID) | ORAL | Status: DC

## 2019-01-16 MED ORDER — CIPROFLOXACIN HCL 500 MG PO TABS: 500.0000 mg | ORAL_TABLET | Freq: Two times a day (BID) | ORAL | 0 refills | Status: AC

## 2019-01-16 MED ORDER — METRONIDAZOLE 500 MG PO TABS: 500.0000 mg | ORAL_TABLET | Freq: Three times a day (TID) | ORAL | 0 refills | Status: AC

## 2019-01-16 MED ORDER — METRONIDAZOLE 500 MG PO TABS: 500.0000 mg | ORAL_TABLET | Freq: Three times a day (TID) | ORAL | 0 refills | Status: DC

## 2019-01-16 MED ORDER — POLYETHYLENE GLYCOL 3350 17 G PO PACK: 17.0000 g | PACK | Freq: Every day | ORAL | 0 refills | Status: DC | PRN

## 2019-01-16 MED ORDER — OXYCODONE HCL 5 MG PO TABS: 5.0000 mg | ORAL_TABLET | Freq: Four times a day (QID) | ORAL | 0 refills | Status: DC | PRN

## 2019-01-16 MED ORDER — CIPROFLOXACIN HCL 500 MG PO TABS: 500.0000 mg | ORAL_TABLET | Freq: Two times a day (BID) | ORAL | 0 refills | Status: DC

## 2019-01-16 NOTE — Progress Notes (Signed)
[name] to be D/C'd  per MD order. Discussed with the patient and all questions fully answered.  VSS, Skin clean, dry and intact without evidence of skin break down, no evidence of skin tears noted.  IV catheter discontinued intact. Site without signs and symptoms of complications. Dressing and pressure applied.  An After Visit Summary was printed and given to the patient. Patient received prescription.  D/c education completed with patient/family including follow up instructions, medication list, d/c activities limitations if indicated, with other d/c instructions as indicated by MD - patient able to verbalize understanding, all questions fully answered.   Patient instructed to return to ED, call 911, or call MD for any changes in condition.   Patient to be escorted via [place], and D/C home via private auto.

## 2019-01-16 NOTE — Discharge Summary (Signed)
[facility] Discharge Summary   Patient ID: Mark Bowen MRN: [id number] DOB/AGE: Mar 31, 1935 84 y.o.  Admit date: 01/11/2019 Discharge date: 01/16/2019  Admitting Diagnosis: Acute cholecystitis  Discharge Diagnosis Patient Active Problem List   Diagnosis Date Noted  . Bile leak   . Acute cholecystitis 01/12/2019  . HLD (hyperlipidemia) 06/10/2016  . Sepsis ([place]) 06/10/2016  . Diabetes mellitus without complication ([place]) [id number]  . Fever   . DIVERTICULITIS-COLON 01/08/2008  . CHOLELITHIASIS 01/08/2008  . PERSONAL HX COLONIC POLYPS 01/08/2008  . DIABETES MELLITUS, TYPE I, CONTROLLED 12/31/2007  . Essential hypertension 12/31/2007  . GERD 12/31/2007  . ARTHRITIS 12/31/2007  . HEMORRHOIDS, INTERNAL 05/02/2006  . DIVERTICULOSIS, COLON 05/02/2006    Consultants Gastroenterology  Imaging: DG ERCP BILIARY & PANCREATIC DUCTS  Result Date: 01/14/2019 CLINICAL DATA:  History of cholecystectomy on 01/13/2019, now with concern for bile leak EXAM: ERCP TECHNIQUE: Multiple spot images obtained with the fluoroscopic device and submitted for interpretation post-procedure. COMPARISON:  Right upper quadrant abdominal ultrasound-01/12/2019 FLUOROSCOPY TIME:  14 minutes, 16 seconds FINDINGS: Thirteen spot intraoperative fluoroscopic images of the right upper abdominal quadrant during ERCP are provided for review Initial image demonstrates an ERCP probe overlying the right upper abdominal quadrant. A surgical drainage catheter overlies the right upper abdominal quadrant. Subsequent images demonstrate selective cannulation and opacification of the pancreatic duct which appears nondilated. Subsequent images demonstrate selective cannulation and opacification of the common bile duct with extravasation of administered contrast about the residual cystic duct and gallbladder fossa. Completion images demonstrate placement of an internal biliary stent traversing the expected location of the  confluence with the cystic duct. IMPRESSION: ERCP with findings worrisome for cystic duct remnant bile leak with subsequent internal biliary stent placement. These images were submitted for radiologic interpretation only. Please see the procedural report for the amount of contrast and the fluoroscopy time utilized. Electronically Signed   By: Sandi Mariscal M.D.   On: 01/14/2019 15:58    Procedures Dr. Dema Severin (01/13/2019) - Laparoscopic subtotal cholecystectomy Dr. Henrene Pastor (01/14/2019) - ERCP  Hospital Course:  Mark Bowen is an 84yo male PMH HTN, DM, HLD, GERD, h/o prostate cancer s/p surgery who presented to [facility] 1/4 with acute onset abdominal pain.  Workup included u/s which shows a gallstone imbedded in the neck of the gallbladder with gallbladder wall thickening, pericholecystic fluid, and focal tenderness over the gallbladder consistent with acute cholecystitis. LFTs and lipase WNL. WBC 7.3, lactic acid 3.1.  Patient was admitted and underwent procedure listed above.  Intraoperatively his gallbladder was found to be gangrenous, therefore he underwent subtotal cholecystectomy and JP drain was placed. POD#1 revealed bilious output from the drain. Gastroenterology was consulted for bile leak and performed ERCP 1/6 with stent placement into the common bile duct. Patient did develop post-ERCP pancreatitis but this was mild and improved with time. Diet was advanced as tolerated.  POn POD3 the patient was voiding well, tolerating diet, ambulating well, pain well controlled, vital signs stable, incisions c/d/i and felt stable for discharge home. He was discharged with 6 days of cipro/flagyl to complete 10 total days of antibiotics. He will keep JP drain in place. Patient will follow up as below and knows to call with questions or concerns.    I have personally reviewed the patients medication history on the [place] controlled substance database.    Physical Exam: Gen:  Alert, NAD, pleasant, cooperative Pulm:  Rate  and effort normal Abd: Soft, NT/ND, incisions cdi with glue intact and no  erythema or drainage, drain is serosanguinous  Skin: no rashes noted, warm and dry   Allergies as of 01/16/2019      Reactions   Iodine    unknown   Iohexol     Code: HIVES, Desc: pt. states he breaks out in hives with iv dye      Medication List    TAKE these medications   acetaminophen 325 MG tablet Commonly known as: TYLENOL Take 2 tablets (650 mg total) by mouth every 6 (six) hours as needed for mild pain, fever or headache.   aspirin 81 MG tablet Take 81 mg by mouth daily.   benazepril 20 MG tablet Commonly known as: LOTENSIN Take 1 tablet (20 mg total) by mouth daily.   ciprofloxacin 500 MG tablet Commonly known as: Cipro Take 1 tablet (500 mg total) by mouth 2 (two) times daily for 6 days.   fish oil-omega-3 fatty acids 1000 MG capsule Take 1 g by mouth daily.   gabapentin 600 MG tablet Commonly known as: NEURONTIN Take 0.5 tablets (300 mg total) by mouth 2 (two) times daily.   metroNIDAZOLE 500 MG tablet Commonly known as: Flagyl Take 1 tablet (500 mg total) by mouth 3 (three) times daily for 6 days.   omeprazole 20 MG capsule Commonly known as: PRILOSEC Take 20 mg by mouth daily.   oxyCODONE 5 MG immediate release tablet Commonly known as: Oxy IR/ROXICODONE Take 1 tablet (5 mg total) by mouth every 6 (six) hours as needed for severe pain.   polyethylene glycol 17 g packet Commonly known as: MIRALAX / GLYCOLAX Take 17 g by mouth daily as needed for mild constipation.   saccharomyces boulardii 250 MG capsule Commonly known as: Florastor Take 1 capsule (250 mg total) by mouth 2 (two) times daily. You can find a probiotic to take over the counter.   simvastatin 40 MG tablet Commonly known as: ZOCOR Take 20 mg by mouth every morning.   sitaGLIPtin-metformin 50-1000 MG tablet Commonly known as: JANUMET Take 1 tablet by mouth 2 (two) times daily with a meal.        Follow-up  Information    Ileana Roup, MD. Go on 01/27/2019.   Specialty: General Surgery Why: at 10:30am for follow up. Please arrive 30 minutes prior to complete paperwork. please bring photo ID and insurance card Contact information: [place] [address] [phone]        [name], MD. Call.   Specialty: Internal Medicine Why: Call to arrange post-hospitalization follow up appointment with your primary care physician Contact information: [place] [address] [phone]           Signed: Wellington Hampshire, [facility] 01/16/2019, 8:23 AM Please see Amion for pager number during day hours 7:00am-4:30pm

## 2019-01-16 NOTE — Plan of Care (Signed)

## 2019-01-16 NOTE — Progress Notes (Signed)
Patient discharged to home with instructions. 

## 2019-01-16 NOTE — Discharge Instructions (Signed)
[facility] ______[facility] LAPAROSCOPIC SURGERY: POST OP INSTRUCTIONS Always review your discharge instruction sheet given to you by the facility where your surgery was performed. IF YOU HAVE DISABILITY OR FAMILY LEAVE FORMS, YOU MUST BRING THEM TO THE OFFICE FOR PROCESSING.   DO NOT GIVE THEM TO YOUR DOCTOR.  1. A prescription for pain medication may be given to you upon discharge.  Take your pain medication as prescribed, if needed.  If narcotic pain medicine is not needed, then you may take acetaminophen (Tylenol) or ibuprofen (Advil) as needed. 2. Take your usually prescribed medications unless otherwise directed. 3. If you need a refill on your pain medication, please contact your pharmacy.  They will contact our office to request authorization. Prescriptions will not be filled after 5pm or on week-ends. 4. You should follow a light diet the first few days after arrival home, such as soup and crackers, etc.  Be sure to include lots of fluids daily. 5. Most patients will experience some swelling and bruising in the area of the incisions.  Ice packs will help.  Swelling and bruising can take several days to resolve.  6. It is common to experience some constipation if taking pain medication after surgery.  Increasing fluid intake and taking a stool softener (such as Colace) will usually help or prevent this problem from occurring.  A mild laxative (Milk of Magnesia or Miralax) should be taken according to package instructions if there are no bowel movements after 48 hours. 7. Unless discharge instructions indicate otherwise, you may remove your bandages 24-48 hours after surgery, and you may shower at that time.  You may have steri-strips (small skin tapes) in place directly over the incision.  These strips should be left on the skin for 7-10 days.  If your surgeon used skin glue on the incision, you may shower in 24 hours.  The glue will flake off over the next 2-3 weeks.  Any sutures or  staples will be removed at the office during your follow-up visit. 8. ACTIVITIES:  You may resume regular (light) daily activities beginning the next day--such as daily self-care, walking, climbing stairs--gradually increasing activities as tolerated.  You may have sexual intercourse when it is comfortable.  Refrain from any heavy lifting or straining until approved by your doctor. a. You may drive when you are no longer taking prescription pain medication, you can comfortably wear a seatbelt, and you can safely maneuver your car and apply brakes. b. RETURN TO WORK:  __________________________________________________________ 9. You should see your doctor in the office for a follow-up appointment approximately 2-3 weeks after your surgery.  Make sure that you call for this appointment within a day or two after you arrive home to insure a convenient appointment time. 10. OTHER INSTRUCTIONS: __________________________________________________________________________________________________________________________ __________________________________________________________________________________________________________________________ WHEN TO CALL YOUR DOCTOR: 1. Fever over 101.0 2. Inability to urinate 3. Continued bleeding from incision. 4. Increased pain, redness, or drainage from the incision. 5. Increasing abdominal pain  The clinic staff is available to answer your questions during regular business hours.  Please don't hesitate to call and ask to speak to one of the nurses for clinical concerns.  If you have a medical emergency, go to the nearest emergency room or call 911.  A surgeon from [facility] is always on call at the hospital. [address] [place], [place], [place]  [address] ? [address], [place], [place]   [address] [phone] ? [phone] ? FAX [phone] Web site:  [email or web address]   Endoscopic Retrograde Cholangiopancreatogram, Care  After This sheet gives you information about how to care for yourself after your procedure. Your health care provider may also give you more specific instructions. If you have problems or questions, contact your health care provider. What can I expect after the procedure? After the procedure, it is common to have:  Soreness in your throat.  Nausea.  Bloating.  Dizziness.  Tiredness (fatigue). Follow these instructions at home:   Take over-the-counter and prescription medicines only as told by your health care provider.  Do not drive for 24 hours if you were given a medicine to help you relax (sedative) during your procedure. Have someone stay with you for 24 hours after the procedure.  Return to your normal activities as told by your health care provider. Ask your health care provider what activities are safe for you.  Return to eating what you normally do as soon as you feel well enough or as told by your health care provider.  Keep all follow-up visits as told by your health care provider. This is important. Contact a health care provider if:  You have pain in your abdomen that does not get better with medicine.  You develop signs of infection, such as: ? Chills. ? Feeling unwell. Get help right away if:  You have difficulty swallowing.  You have worsening pain in your throat, chest, or abdomen.  You vomit bright red blood or a substance that looks like coffee grounds.  You have bloody or very black stools.  You have a fever.  You have a sudden increase in swelling (bloating) in your abdomen. Summary  After the procedure, it is common to feel tired and to have some discomfort in your throat.  Contact your health care provider if you have signs of infection--such as chills or feeling unwell--or if you have pain that does not improve with medicine.  Get help right away if you have trouble swallowing, worsening pain, bloody or black vomit, bloody or black stools, a  fever, or increased swelling in your abdomen.  Keep all follow-up visits as told by your health care provider. This is important. This information is not intended to replace advice given to you by your health care provider. Make sure you discuss any questions you have with your health care provider. Document Revised: 12/07/2016 Document Reviewed: 11/14/2015 Elsevier Patient Education  2020 [place] Surgical drains are used to remove extra fluid that normally builds up in a surgical wound after surgery. A surgical drain helps to heal a surgical wound. Different kinds of surgical drains include:  Active drains. These drains use suction to pull drainage away from the surgical wound. Drainage flows through a tube to a container outside of the body. With these drains, you need to keep the bulb or the drainage container flat (compressed) at all times, except while you empty it. Flattening the bulb or container creates suction.  Passive drains. These drains allow fluid to drain naturally, by gravity. Drainage flows through a tube to a bandage (dressing) or a container outside of the body. Passive drains do not need to be emptied. A drain is placed during surgery. Right after surgery, drainage is usually bright red and a little thicker than water. The drainage may gradually turn yellow or pink and become thinner. It is likely that your health care provider will remove the drain when the drainage stops or when the amount decreases to 1-2  Tbsp (15-30 mL) during a 24-hour period. Supplies needed:  Tape.  Germ-free cleaning solution (sterile saline).  Cotton swabs.  Split gauze drain sponge: 4 x 4 inches (10 x 10 cm).  Gauze square: 4 x 4 inches (10 x 10 cm). How to care for your surgical drain Care for your drain as told by your health care provider. This is important to help prevent infection. If your drain is placed at your back, or any other hard-to-reach area,  ask another person to assist you in performing the following tasks: General care  Keep the skin around the drain dry and covered with a dressing at all times.  Check your drain area every day for signs of infection. Check for: ? Redness, swelling, or pain. ? Pus or a bad smell. ? Cloudy drainage. ? Tenderness or pressure at the drain exit site. Changing the dressing Follow instructions from your health care provider about how to change your dressing. Change your dressing at least once a day. Change it more often if needed to keep the dressing dry. Make sure you: 1. Gather your supplies. 2. Wash your hands with soap and water before you change your dressing. If soap and water are not available, use hand sanitizer. 3. Remove the old dressing. Avoid using scissors to do that. 4. Wash your hands with soap and water again after removing the old dressing. 5. Use sterile saline to clean your skin around the drain. You may need to use a cotton swab to clean the skin. 6. Place the tube through the slit in a drain sponge. Place the drain sponge so that it covers your wound. 7. Place the gauze square or another drain sponge on top of the drain sponge that is on the wound. Make sure the tube is between those layers. 8. Tape the dressing to your skin. 9. Tape the drainage tube to your skin 1-2 inches (2.5-5 cm) below the place where the tube enters your body. Taping keeps the tube from pulling on any stitches (sutures) that you have. 10. Wash your hands with soap and water. 11. Write down the color of your drainage and how often you change your dressing. How to empty your active drain  1. Make sure that you have a measuring cup that you can empty your drainage into. 2. Wash your hands with soap and water. If soap and water are not available, use hand sanitizer. 3. Loosen any pins or clips that hold the tube in place. 4. If your health care provider tells you to strip the tube to prevent clots and tube  blockages: ? Hold the tube at the skin with one hand. Use your other hand to pinch the tubing with your thumb and first finger. ? Gently move your fingers down the tube while squeezing very lightly. This clears any drainage, clots, or tissue from the tube. ? You may need to do this several times each day to keep the tube clear. Do not pull on the tube. 5. Open the bulb cap or the drain plug. Do not touch the inside of the cap or the bottom of the plug. 6. Turn the device upside down and gently squeeze. 7. Empty all of the drainage into the measuring cup. 8. Compress the bulb or the container and replace the cap or the plug. To compress the bulb or the container, squeeze it firmly in the middle while you close the cap or plug the container. 9. Write down the amount of drainage that  you have in each 24-hour period. If you have less than 2 Tbsp (30 mL) of drainage during 24 hours, contact your health care provider. 10. Flush the drainage down the toilet. 11. Wash your hands with soap and water. Contact a health care provider if:  You have redness, swelling, or pain around your drain area.  You have pus or a bad smell coming from your drain area.  You have a fever or chills.  The skin around your drain is warm to the touch.  The amount of drainage that you have is increasing instead of decreasing.  You have drainage that is cloudy.  There is a sudden stop or a sudden decrease in the amount of drainage that you have.  Your drain tube falls out.  Your active drain does not stay compressed after you empty it. Summary  Surgical drains are used to remove extra fluid that normally builds up in a surgical wound after surgery.  Different kinds of surgical drains include active drains and passive drains. Active drains use suction to pull drainage away from the surgical wound, and passive drains allow fluid to drain naturally.  It is important to care for your drain to prevent infection. If  your drain is placed at your back, or any other hard-to-reach area, ask another person to assist you.  Contact your health care provider if you have redness, swelling, or pain around your drain area. This information is not intended to replace advice given to you by your health care provider. Make sure you discuss any questions you have with your health care provider. Document Revised: 01/29/2018 Document Reviewed: 01/29/2018 Elsevier Patient Education  2020 [employer].

## 2019-01-17 LAB — CULTURE, BLOOD (ROUTINE X 2)
Culture: NO GROWTH
Culture: NO GROWTH
Special Requests: ADEQUATE
Special Requests: ADEQUATE

## 2019-01-28 ENCOUNTER — Telehealth: Payer: Self-pay | Admitting: Internal Medicine

## 2019-01-28 NOTE — Telephone Encounter (Signed)
Spoke with pt and he was supposed to have a cervical fusion but wanted to know if he should wait due to needing to have stent removed by Dr. Henrene Pastor. Discussed with pt that he should wait to discuss this with Dr. Henrene Pastor at his hospital follow-up appt that is scheduled. Pt verbalized understanding.

## 2019-01-28 NOTE — Telephone Encounter (Signed)
Patient is calling to inform us that he his needing to do a cervical procedure with Dr. Ronnald Ramp but would like to know which procedure he should be doing first. The one with Dr. Ronnald Ramp which has not been scheduled as of yet or the one with Dr. Henrene Pastor Scheduled for 02/18/19.

## 2019-02-18 ENCOUNTER — Other Ambulatory Visit: Payer: Self-pay

## 2019-02-18 ENCOUNTER — Other Ambulatory Visit: Payer: Self-pay | Admitting: Neurological Surgery

## 2019-02-18 ENCOUNTER — Encounter: Payer: Self-pay | Admitting: Internal Medicine

## 2019-02-18 ENCOUNTER — Ambulatory Visit (INDEPENDENT_AMBULATORY_CARE_PROVIDER_SITE_OTHER): Payer: Medicare Other | Admitting: Internal Medicine

## 2019-02-18 VITALS — BP 140/78 | HR 72 | Temp 97.4°F | Ht 68.0 in | Wt 157.0 lb

## 2019-02-18 DIAGNOSIS — K839 Disease of biliary tract, unspecified: Secondary | ICD-10-CM

## 2019-02-18 DIAGNOSIS — R151 Fecal smearing: Secondary | ICD-10-CM

## 2019-02-18 NOTE — H&P (View-Only) (Signed)
HISTORY OF PRESENT ILLNESS:  Mark Bowen is a 84 y.o. male, Mark Bowen, with past medical history as listed below.  I have seen the patient previously for surveillance colonoscopy.  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  Last colonoscopy 2013 there were no polyps.  In any event, the patient had not been seen since until January 2021 when he presented to the hospital January 11, 2019 with acute cholecystitis.  He subsequently underwent laparoscopic subtotal cholecystectomy with Dr. Nadeen Landau January 13, 2019.  His case was complicated by bile duct leak for which she underwent ERCP January 14, 2019.  Biliary sphincterotomy confirmed bile leak in the region of the cystic duct/gallbladder remnant.  No other abnormalities.  After performing a biliary sphincterotomy a 6 cm in length 10 mm in diameter covered metal stent was placed.  Was discharged home January 16, 2019.  He had subsequent surgical follow-up with removal of his percutaneous drain.  Last blood work was at the time of discharge.  At that time normal CBC and normal liver tests.  Patient tells me that he has been doing well.  He describes occasional sharp gas pains in the upper abdomen which are transient.  No fevers or jaundice.  No back or shoulder pain.  He also mentions occasional fecal leakage  REVIEW OF SYSTEMS:  All non-GI ROS negative unless otherwise stated in the HPI except for arthritis, back pain  Past Medical History:  Diagnosis Date  . Acute cholecystitis 01/2019  . Arthritis   . Diabetes mellitus 2007   type 2  . Diverticulitis   . GERD (gastroesophageal reflux disease)   . Hypercholesterolemia since 2008 preventative   taking Zocor, now cholesterol WNL  . Hypertension since 1970  . Prostate CA ([place])   . Shingles     Past Surgical History:  Procedure Laterality Date  . APPENDECTOMY  1960  . [place] & 2009  . BILIARY STENT PLACEMENT  01/14/2019   Procedure: BILIARY STENT  PLACEMENT;  Surgeon: Irene Shipper, MD;  Location: [place];  Service: Endoscopy;;  . CHOLECYSTECTOMY N/A 01/13/2019   Procedure: LAPAROSCOPIC SUBTOTAL CHOLECYSTECTOMY, WITH PLACEMENT OF DRAIN;  Surgeon: Ileana Roup, MD;  Location: [facility];  Service: General;  Laterality: N/A;  . ERCP N/A 01/14/2019   Procedure: ENDOSCOPIC RETROGRADE CHOLANGIOPANCREATOGRAPHY (ERCP);  Surgeon: Irene Shipper, MD;  Location: [facility] ENDOSCOPY;  Service: Endoscopy;  Laterality: N/A;  . INGUINAL HERNIA REPAIR  1970   bilateral  . KNEE SURGERY Right 11/2018  . PROSTATE SURGERY    . SPHINCTEROTOMY  01/14/2019   Procedure: SPHINCTEROTOMY;  Surgeon: Irene Shipper, MD;  Location: [facility] ENDOSCOPY;  Service: Endoscopy;;  . TONSILLECTOMY  1960    Social History [name]  reports that he quit smoking about 59 years ago. He has never used smokeless tobacco. He reports that he does not drink alcohol or use drugs.  family history includes Lung cancer in his father; Prostate cancer in his brother.  Allergies  Allergen Reactions  . Iodine     Unknown reaction  . Iohexol Hives            PHYSICAL EXAMINATION: Vital signs: BP 140/78   Pulse 72   Temp (!) 97.4 F (36.3 C)   Ht 5\' 8"  (1.727 m)   Wt 157 lb (71.2 kg)   BMI 23.87 kg/m   Constitutional: generally well-appearing, no acute distress Psychiatric: alert and oriented x3, cooperative Eyes:  extraocular movements intact, anicteric, conjunctiva pink Mouth: oral pharynx moist, no lesions Neck: supple no lymphadenopathy Cardiovascular: heart regular rate and rhythm, no murmur Lungs: clear to auscultation bilaterally Abdomen: soft, nontender, nondistended, no obvious ascites, no peritoneal signs, normal bowel sounds, no organomegaly.  Surgical wounds healing well Rectal: Omitted Extremities: no clubbing, cyanosis, or lower extremity edema bilaterally Skin: no lesions on visible extremities Neuro: No focal deficits.  Cranial nerves  intact  ASSESSMENT:  1.  Postoperative bile duct leak post subtotal laparoscopic cholecystectomy January 13, 2019.  Subsequent ERCP with SEMS placement January 14, 2019.  Has recovered with subsequent removal of percutaneous drain.  Now back for routine outpatient follow-up to discuss stent removal. 2.  History of nonadvanced adenomatous colon polyps.  Has aged out of surveillance 3.  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  General medical problems 5.  Fecal leakage  PLAN:  1.  Schedule ERCP with stent removal at the hospital with general anesthesia.  The patient is high risk given his age and comorbidities.The nature of the procedure, as well as the risks, benefits, and alternatives were carefully and thoroughly reviewed with the patient. Ample time for discussion and questions allowed. The patient understood, was satisfied, and agreed to proceed. 2.  Hold diabetic medications the day of the procedure to avoid unwanted hypoglycemia 3.  Increase bulk fiber for fecal leakage

## 2019-02-18 NOTE — Patient Instructions (Addendum)
Due to recent COVID-19 restrictions implemented by our local and state authorities and in an effort to keep both patients and staff as safe as possible, our hospital system now requires COVID-19 testing prior to any scheduled hospital procedure. Please go to our [facility] location drive thru testing site ([address], [place], [place] [address]) on 2/11/2021at  10:45am. There will be multiple testing areas, the first checkpoint being for pre-procedure/surgery testing. Get into the right (yellow) lane that leads to the PAT testing team. You will not be billed at the time of testing but may receive a bill later depending on your insurance. The approximate cost of the test is $100. You must agree to quarantine from the time of your testing until the procedure date on 02/23/2019 . This should include staying at home with ONLY the people you live with. Avoid take-out, grocery store shopping or leaving the house for any non-emergent reason. Failure to have your COVID-19 test done on the date and time you have been scheduled will result in cancellation of procedure. Please call our office at [phone] if you have any questions.    You have been scheduled for an ERCP. Please follow written instructions given to you at your visit today. If you use inhalers (even only as needed), please bring them with you on the day of your procedure.

## 2019-02-18 NOTE — Progress Notes (Signed)
HISTORY OF PRESENT ILLNESS:  Mark Bowen is a 84 y.o. male, Mark Bowen, with past medical history as listed below.  I have seen the patient previously for surveillance colonoscopy.  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  Last colonoscopy 2013 there were no polyps.  In any event, the patient had not been seen since until January 2021 when he presented to the hospital January 11, 2019 with acute cholecystitis.  He subsequently underwent laparoscopic subtotal cholecystectomy with Dr. Nadeen Landau January 13, 2019.  His case was complicated by bile duct leak for which she underwent ERCP January 14, 2019.  Biliary sphincterotomy confirmed bile leak in the region of the cystic duct/gallbladder remnant.  No other abnormalities.  After performing a biliary sphincterotomy a 6 cm in length 10 mm in diameter covered metal stent was placed.  Was discharged home January 16, 2019.  He had subsequent surgical follow-up with removal of his percutaneous drain.  Last blood work was at the time of discharge.  At that time normal CBC and normal liver tests.  Patient tells me that he has been doing well.  He describes occasional sharp gas pains in the upper abdomen which are transient.  No fevers or jaundice.  No back or shoulder pain.  He also mentions occasional fecal leakage  REVIEW OF SYSTEMS:  All non-GI ROS negative unless otherwise stated in the HPI except for arthritis, back pain  Past Medical History:  Diagnosis Date  . Acute cholecystitis 01/2019  . Arthritis   . Diabetes mellitus 2007   type 2  . Diverticulitis   . GERD (gastroesophageal reflux disease)   . Hypercholesterolemia since 2008 preventative   taking Zocor, now cholesterol WNL  . Hypertension since 1970  . Prostate CA ([place])   . Shingles     Past Surgical History:  Procedure Laterality Date  . APPENDECTOMY  1960  . [place] & 2009  . BILIARY STENT PLACEMENT  01/14/2019   Procedure: BILIARY STENT  PLACEMENT;  Surgeon: Irene Shipper, MD;  Location: [place];  Service: Endoscopy;;  . CHOLECYSTECTOMY N/A 01/13/2019   Procedure: LAPAROSCOPIC SUBTOTAL CHOLECYSTECTOMY, WITH PLACEMENT OF DRAIN;  Surgeon: Ileana Roup, MD;  Location: [place];  Service: General;  Laterality: N/A;  . ERCP N/A 01/14/2019   Procedure: ENDOSCOPIC RETROGRADE CHOLANGIOPANCREATOGRAPHY (ERCP);  Surgeon: Irene Shipper, MD;  Location: [facility] ENDOSCOPY;  Service: Endoscopy;  Laterality: N/A;  . INGUINAL HERNIA REPAIR  1970   bilateral  . KNEE SURGERY Right 11/2018  . PROSTATE SURGERY    . SPHINCTEROTOMY  01/14/2019   Procedure: SPHINCTEROTOMY;  Surgeon: Irene Shipper, MD;  Location: [facility] ENDOSCOPY;  Service: Endoscopy;;  . TONSILLECTOMY  1960    Social History [name]  reports that he quit smoking about 59 years ago. He has never used smokeless tobacco. He reports that he does not drink alcohol or use drugs.  family history includes Lung cancer in his father; Prostate cancer in his brother.  Allergies  Allergen Reactions  . Iodine     Unknown reaction  . Iohexol Hives            PHYSICAL EXAMINATION: Vital signs: BP 140/78   Pulse 72   Temp (!) 97.4 F (36.3 C)   Ht 5\' 8"  (1.727 m)   Wt 157 lb (71.2 kg)   BMI 23.87 kg/m   Constitutional: generally well-appearing, no acute distress Psychiatric: alert and oriented x3, cooperative Eyes:  extraocular movements intact, anicteric, conjunctiva pink Mouth: oral pharynx moist, no lesions Neck: supple no lymphadenopathy Cardiovascular: heart regular rate and rhythm, no murmur Lungs: clear to auscultation bilaterally Abdomen: soft, nontender, nondistended, no obvious ascites, no peritoneal signs, normal bowel sounds, no organomegaly.  Surgical wounds healing well Rectal: Omitted Extremities: no clubbing, cyanosis, or lower extremity edema bilaterally Skin: no lesions on visible extremities Neuro: No focal deficits.  Cranial nerves  intact  ASSESSMENT:  1.  Postoperative bile duct leak post subtotal laparoscopic cholecystectomy January 13, 2019.  Subsequent ERCP with SEMS placement January 14, 2019.  Has recovered with subsequent removal of percutaneous drain.  Now back for routine outpatient follow-up to discuss stent removal. 2.  History of nonadvanced adenomatous colon polyps.  Has aged out of surveillance 3.  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  General medical problems 5.  Fecal leakage  PLAN:  1.  Schedule ERCP with stent removal at the hospital with general anesthesia.  The patient is high risk given his age and comorbidities.The nature of the procedure, as well as the risks, benefits, and alternatives were carefully and thoroughly reviewed with the patient. Ample time for discussion and questions allowed. The patient understood, was satisfied, and agreed to proceed. 2.  Hold diabetic medications the day of the procedure to avoid unwanted hypoglycemia 3.  Increase bulk fiber for fecal leakage

## 2019-02-19 ENCOUNTER — Telehealth: Payer: Self-pay | Admitting: Internal Medicine

## 2019-02-19 ENCOUNTER — Other Ambulatory Visit (HOSPITAL_COMMUNITY)
Admission: RE | Admit: 2019-02-19 | Discharge: 2019-02-19 | Disposition: A | Discharge status: Home / Self Care | Payer: Medicare Other | Source: Ambulatory Visit | Attending: Internal Medicine | Admitting: Internal Medicine

## 2019-02-19 DIAGNOSIS — Z20822 Contact with and (suspected) exposure to covid-19: Secondary | ICD-10-CM | POA: Diagnosis not present

## 2019-02-19 DIAGNOSIS — Z01812 Encounter for preprocedural laboratory examination: Secondary | ICD-10-CM | POA: Diagnosis present

## 2019-02-19 LAB — SARS CORONAVIRUS 2 (TAT 6-24 HRS): SARS Coronavirus 2: NEGATIVE

## 2019-02-19 NOTE — Telephone Encounter (Signed)
Spoke with pt and he is aware. 

## 2019-02-19 NOTE — Telephone Encounter (Signed)
Pt is scheduled for stent removal with Dr. Henrene Pastor on 2/15. States he is also scheduled for vertebrae fusion on 2/22 with Dr. Sherley Bounds [facility]. Pt wants to make sure it is ok to have the spine procedure done on 2/22 following the stent removal. Please advise.

## 2019-02-19 NOTE — Telephone Encounter (Signed)
Yes

## 2019-02-20 NOTE — Addendum Note (Signed)
Addended by: Audrea Muscat on: 02/20/2019 02:53 PM   Modules accepted: Orders

## 2019-02-23 ENCOUNTER — Encounter (HOSPITAL_COMMUNITY)
Admission: RE | Disposition: A | Discharge status: Home / Self Care | Payer: Self-pay | Source: Home / Self Care | Attending: Internal Medicine

## 2019-02-23 ENCOUNTER — Ambulatory Visit (HOSPITAL_COMMUNITY): Payer: Medicare Other | Admitting: Anesthesiology

## 2019-02-23 ENCOUNTER — Ambulatory Visit (HOSPITAL_COMMUNITY): Payer: Medicare Other

## 2019-02-23 ENCOUNTER — Other Ambulatory Visit: Payer: Self-pay

## 2019-02-23 ENCOUNTER — Ambulatory Visit (HOSPITAL_COMMUNITY)
Admission: RE | Admit: 2019-02-23 | Discharge: 2019-02-23 | Disposition: A | Discharge status: Home / Self Care | Payer: Medicare Other | Attending: Internal Medicine | Admitting: Internal Medicine

## 2019-02-23 ENCOUNTER — Encounter (HOSPITAL_COMMUNITY): Payer: Self-pay | Admitting: Internal Medicine

## 2019-02-23 DIAGNOSIS — Z87891 Personal history of nicotine dependence: Secondary | ICD-10-CM | POA: Diagnosis not present

## 2019-02-23 DIAGNOSIS — I10 Essential (primary) hypertension: Secondary | ICD-10-CM | POA: Insufficient documentation

## 2019-02-23 DIAGNOSIS — Z79899 Other long term (current) drug therapy: Secondary | ICD-10-CM | POA: Insufficient documentation

## 2019-02-23 DIAGNOSIS — K838 Other specified diseases of biliary tract: Secondary | ICD-10-CM | POA: Diagnosis not present

## 2019-02-23 DIAGNOSIS — K839 Disease of biliary tract, unspecified: Secondary | ICD-10-CM

## 2019-02-23 DIAGNOSIS — Z86010 Personal history of colonic polyps: Secondary | ICD-10-CM | POA: Diagnosis not present

## 2019-02-23 DIAGNOSIS — Z8546 Personal history of malignant neoplasm of prostate: Secondary | ICD-10-CM | POA: Diagnosis not present

## 2019-02-23 DIAGNOSIS — K8050 Calculus of bile duct without cholangitis or cholecystitis without obstruction: Secondary | ICD-10-CM | POA: Diagnosis not present

## 2019-02-23 DIAGNOSIS — E7800 Pure hypercholesterolemia, unspecified: Secondary | ICD-10-CM | POA: Insufficient documentation

## 2019-02-23 DIAGNOSIS — E119 Type 2 diabetes mellitus without complications: Secondary | ICD-10-CM | POA: Insufficient documentation

## 2019-02-23 DIAGNOSIS — Z4659 Encounter for fitting and adjustment of other gastrointestinal appliance and device: Secondary | ICD-10-CM

## 2019-02-23 DIAGNOSIS — Z4689 Encounter for fitting and adjustment of other specified devices: Secondary | ICD-10-CM | POA: Diagnosis not present

## 2019-02-23 DIAGNOSIS — Z888 Allergy status to other drugs, medicaments and biological substances status: Secondary | ICD-10-CM | POA: Diagnosis not present

## 2019-02-23 DIAGNOSIS — Z9049 Acquired absence of other specified parts of digestive tract: Secondary | ICD-10-CM | POA: Insufficient documentation

## 2019-02-23 DIAGNOSIS — M1990 Unspecified osteoarthritis, unspecified site: Secondary | ICD-10-CM | POA: Insufficient documentation

## 2019-02-23 HISTORY — PX: ERCP: SHX5425

## 2019-02-23 HISTORY — PX: STENT REMOVAL: SHX6421

## 2019-02-23 LAB — GLUCOSE, CAPILLARY: Glucose-Capillary: 133 mg/dL — ABNORMAL HIGH (ref 70–99)

## 2019-02-23 SURGERY — ERCP, WITH INTERVENTION IF INDICATED
Anesthesia: General

## 2019-02-23 MED ORDER — LACTATED RINGERS IV SOLN
INTRAVENOUS | Status: DC
  Administered 2019-02-23: 1000 mL via INTRAVENOUS

## 2019-02-23 MED ORDER — PROPOFOL 10 MG/ML IV BOLUS
INTRAVENOUS | Status: DC | PRN
  Administered 2019-02-23: 100 mg via INTRAVENOUS
  Administered 2019-02-23: 30 mg via INTRAVENOUS

## 2019-02-23 MED ORDER — SODIUM CHLORIDE 0.9 % IV SOLN
INTRAVENOUS | Status: DC | PRN
  Administered 2019-02-23: 30 mL

## 2019-02-23 MED ORDER — LIDOCAINE 2% (20 MG/ML) 5 ML SYRINGE
INTRAMUSCULAR | Status: DC | PRN
  Administered 2019-02-23: 60 mg via INTRAVENOUS

## 2019-02-23 MED ORDER — CIPROFLOXACIN IN D5W 400 MG/200ML IV SOLN
INTRAVENOUS | Status: AC
  Filled 2019-02-23: qty 200

## 2019-02-23 MED ORDER — GLUCAGON HCL RDNA (DIAGNOSTIC) 1 MG IJ SOLR
INTRAMUSCULAR | Status: AC
  Filled 2019-02-23: qty 1

## 2019-02-23 MED ORDER — PROPOFOL 10 MG/ML IV BOLUS
INTRAVENOUS | Status: AC
  Filled 2019-02-23: qty 20

## 2019-02-23 MED ORDER — INDOMETHACIN 50 MG RE SUPP
RECTAL | Status: AC
  Filled 2019-02-23: qty 2

## 2019-02-23 MED ORDER — FENTANYL CITRATE (PF) 100 MCG/2ML IJ SOLN
INTRAMUSCULAR | Status: AC
  Filled 2019-02-23: qty 2

## 2019-02-23 MED ORDER — SODIUM CHLORIDE 0.9 % IV SOLN: INTRAVENOUS | Status: DC

## 2019-02-23 MED ORDER — PHENYLEPHRINE 40 MCG/ML (10ML) SYRINGE FOR IV PUSH (FOR BLOOD PRESSURE SUPPORT)
PREFILLED_SYRINGE | INTRAVENOUS | Status: DC | PRN
  Administered 2019-02-23: 80 ug via INTRAVENOUS

## 2019-02-23 MED ORDER — DEXAMETHASONE SODIUM PHOSPHATE 4 MG/ML IJ SOLN
INTRAMUSCULAR | Status: DC | PRN
  Administered 2019-02-23: 5 mg via INTRAVENOUS

## 2019-02-23 MED ORDER — ROCURONIUM BROMIDE 10 MG/ML (PF) SYRINGE
PREFILLED_SYRINGE | INTRAVENOUS | Status: DC | PRN
  Administered 2019-02-23: 40 mg via INTRAVENOUS

## 2019-02-23 MED ORDER — SUGAMMADEX SODIUM 200 MG/2ML IV SOLN
INTRAVENOUS | Status: DC | PRN
  Administered 2019-02-23: 150 mg via INTRAVENOUS

## 2019-02-23 MED ORDER — SUCCINYLCHOLINE CHLORIDE 200 MG/10ML IV SOSY
PREFILLED_SYRINGE | INTRAVENOUS | Status: DC | PRN
  Administered 2019-02-23: 100 mg via INTRAVENOUS

## 2019-02-23 MED ORDER — ONDANSETRON HCL 4 MG/2ML IJ SOLN
INTRAMUSCULAR | Status: DC | PRN
  Administered 2019-02-23: 4 mg via INTRAVENOUS

## 2019-02-23 NOTE — Anesthesia Procedure Notes (Signed)
Procedure Name: Intubation Date/Time: 02/23/2019 10:52 AM Performed by: Claudia Desanctis, CRNA Pre-anesthesia Checklist: Patient identified, Emergency Drugs available, Suction available and Patient being monitored Patient Re-evaluated:Patient Re-evaluated prior to induction Oxygen Delivery Method: Circle system utilized Preoxygenation: Pre-oxygenation with 100% oxygen Induction Type: IV induction and Rapid sequence Laryngoscope Size: 2 and Miller Grade View: Grade I Tube type: Oral Number of attempts: 1 Airway Equipment and Method: Stylet Placement Confirmation: ETT inserted through vocal cords under direct vision,  positive ETCO2 and breath sounds checked- equal and bilateral Tube secured with: Tape Dental Injury: Teeth and Oropharynx as per pre-operative assessment

## 2019-02-23 NOTE — Discharge Instructions (Signed)
YOU HAD AN ENDOSCOPIC PROCEDURE TODAY: Refer to the procedure report and other information in the discharge instructions given to you for any specific questions about what was found during the examination. If this information does not answer your questions, please call [place] office at [phone] to clarify.  ° °YOU SHOULD EXPECT: Some feelings of bloating in the abdomen. Passage of more gas than usual. Walking can help get rid of the air that was put into your GI tract during the procedure and reduce the bloating. If you had a lower endoscopy (such as a colonoscopy or flexible sigmoidoscopy) you may notice spotting of blood in your stool or on the toilet paper. Some abdominal soreness may be present for a day or two, also. ° °DIET: Your first meal following the procedure should be a light meal and then it is ok to progress to your normal diet. A half-sandwich or bowl of soup is an example of a good first meal. Heavy or fried foods are harder to digest and may make you feel nauseous or bloated. Drink plenty of fluids but you should avoid alcoholic beverages for 24 hours. If you had a esophageal dilation, please see attached instructions for diet.   ° °ACTIVITY: Your care partner should take you home directly after the procedure. You should plan to take it easy, moving slowly for the rest of the day. You can resume normal activity the day after the procedure however YOU SHOULD NOT DRIVE, use power tools, machinery or perform tasks that involve climbing or major physical exertion for 24 hours (because of the sedation medicines used during the test).  ° °SYMPTOMS TO REPORT IMMEDIATELY: °A gastroenterologist can be reached at any hour. Please call [phone]  for any of the following symptoms:  °Following lower endoscopy (colonoscopy, flexible sigmoidoscopy) °Excessive amounts of blood in the stool  °Significant tenderness, worsening of abdominal pains  °Swelling of the abdomen that is new, acute  °Fever of 100° or  higher  °Following upper endoscopy (EGD, EUS, ERCP, esophageal dilation) °Vomiting of blood or coffee ground material  °New, significant abdominal pain  °New, significant chest pain or pain under the shoulder blades  °Painful or persistently difficult swallowing  °New shortness of breath  °Black, tarry-looking or red, bloody stools ° °FOLLOW UP:  °If any biopsies were taken you will be contacted by phone or by letter within the next 1-3 weeks. Call [phone]  if you have not heard about the biopsies in 3 weeks.  °Please also call with any specific questions about appointments or follow up tests. ° °

## 2019-02-23 NOTE — Transfer of Care (Signed)
Immediate Anesthesia Transfer of Care Note  Patient: Mark Bowen  Procedure(s) Performed: Procedure(s): ENDOSCOPIC RETROGRADE CHOLANGIOPANCREATOGRAPHY (ERCP) (N/A) STENT REMOVAL  Patient Location: PACU  Anesthesia Type:General  Level of Consciousness:  sedated, patient cooperative and responds to stimulation  Airway & Oxygen Therapy:Patient Spontanous Breathing and Patient connected to face mask oxgen  Post-op Assessment:  Report given to PACU RN and Post -op Vital signs reviewed and stable  Post vital signs:  Reviewed and stable  Last Vitals:  Vitals:   02/23/19 0922  BP: (!) 182/86  Pulse: 68  Resp: 16  Temp: 36.7 C  SpO2: 0000000    Complications: No apparent anesthesia complications

## 2019-02-23 NOTE — Interval H&P Note (Signed)
History and Physical Interval Note:  02/23/2019 10:01 AM  Mark Bowen  has presented today for surgery, with the diagnosis of Bile leak.  The various methods of treatment have been discussed with the patient and family. After consideration of risks, benefits and other options for treatment, the patient has consented to  Procedure(s): ENDOSCOPIC RETROGRADE CHOLANGIOPANCREATOGRAPHY (ERCP) (N/A) as a surgical intervention.  The patient's history has been reviewed, patient examined, no change in status, stable for surgery.  I have reviewed the patient's chart and labs.  Questions were answered to the patient's satisfaction.     Scarlette Shorts

## 2019-02-23 NOTE — Op Note (Signed)
[facility] Patient Name: Mark Bowen Procedure Date: 02/23/2019 MRN: [id number] Attending MD: Docia Chuck. Henrene Pastor , MD Date of Birth: 1935/03/10 CSN: [id number] Age: 84 Admit Type: Outpatient Procedure:                ERCP with stent removal; with sludge removal via                            balloon Indications:              Bile leak status post ERCP with fully covered                            self-expanding metal stent placement January 14, 2019. Patient had undergone subtotal                            cholecystectomy. Providers:                Docia Chuck. Henrene Pastor, MD, Kary Kos, RN, Cleda Daub,                            RN, Theodora Blow, Technician, Glori Bickers, RN Referring MD:             Nadeen Landau MD Medicines:                General Anesthesia Complications:            No immediate complications. Estimated Blood Loss:     Estimated blood loss: none. Procedure:                Pre-Anesthesia Assessment:                           - Prior to the procedure, a History and Physical                            was performed, and patient medications and                            allergies were reviewed. The patient's tolerance of                            previous anesthesia was also reviewed. The risks                            and benefits of the procedure and the sedation                            options and risks were discussed with the patient.                            All questions were answered, and informed consent  was obtained. Prior Anticoagulants: The patient has                            taken no previous anticoagulant or antiplatelet                            agents. ASA Grade Assessment: II - A patient with                            mild systemic disease. After reviewing the risks                            and benefits, the patient was deemed in                            satisfactory  condition to undergo the procedure.                           After obtaining informed consent, the scope was                            passed under direct vision. Throughout the                            procedure, the patient's blood pressure, pulse, and                            oxygen saturations were monitored continuously. The                            [id number]-[id number] Olympus duodenoscope was                            introduced through the mouth, and used to inject                            contrast into and used to inject contrast into the                            bile duct. The ERCP was accomplished without                            difficulty. The patient tolerated the procedure                            well. Scope In: 11:06:41 AM Scope Out: 11:29:13 AM Total Procedure Duration: 0 hours 22 minutes 31 seconds  Findings:      A biliary stent was visible on the scout film. The esophagus was       successfully intubated under direct vision. The scope was advanced to a       normal major papilla in the descending duodenum without detailed       examination of the pharynx, larynx and associated structures, and upper       GI  tract. The upper GI tract was grossly normal. One stent was removed       from the biliary tree using a snare. The biliary tree was swept with a       12 mm balloon starting at the bifurcation. Sludge was swept from the       duct. All stones were removed. The entire biliary tree was opacified.       There was filling of the gallbladder remnant but no residual leak.       Excellent drainage observed after clearance of sludge material of the       common bile duct. There was no injection or manipulation of the       pancreatic duct. Impression:               1. History of total cholecystectomy with                            postoperative bile leak status post successful                            stent placement with resolution of bile leak.                             Previous biliary stent removed. Gallbladder remnant                            opacified, as well as entire biliary tree.                           2. Sludge removed from the common bile duct with                            balloon Moderate Sedation:      none Recommendation:           1. Standard post procedure observation                           2. Okay for discharge home later today if doing well                           3. Return to the care of your primary provider. GI                            follow-up as needed. Procedure Code(s):        --- Professional ---                           [id number], Endoscopic retrograde                            cholangiopancreatography (ERCP); with removal of                            foreign body(s) or stent(s) from biliary/pancreatic  duct(s)                           43264, Endoscopic retrograde                            cholangiopancreatography (ERCP); with removal of                            calculi/debris from biliary/pancreatic duct(s) Diagnosis Code(s):        --- Professional ---                           K80.50, Calculus of bile duct without cholangitis                            or cholecystitis without obstruction                           Z46.59, Encounter for fitting and adjustment of                            other gastrointestinal appliance and device                           K83.8, Other specified diseases of biliary tract CPT copyright 2019 [employer]. All rights reserved. The codes documented in this report are preliminary and upon coder review may  be revised to meet current compliance requirements. Docia Chuck. Henrene Pastor, MD 02/23/2019 11:39:55 AM This report has been signed electronically. Number of Addenda: 0

## 2019-02-23 NOTE — Anesthesia Preprocedure Evaluation (Addendum)
Anesthesia Evaluation  Patient identified by MRN, date of birth, ID band Patient awake    Reviewed: Allergy & Precautions, NPO status , Patient's Chart, lab work & pertinent test results  History of Anesthesia Complications Negative for: history of anesthetic complications  Airway Mallampati: II  TM Distance: >3 FB Neck ROM: Full    Dental  (+) Edentulous Upper, Partial Lower   Pulmonary former smoker,    Pulmonary exam normal        Cardiovascular hypertension, Pt. on medications Normal cardiovascular exam     Neuro/Psych negative neurological ROS  negative psych ROS   GI/Hepatic Neg liver ROS, GERD  Medicated and Controlled, Bile leak    Endo/Other  diabetes, Type 2, Oral Hypoglycemic Agents  Renal/GU negative Renal ROS    Prostate cancer     Musculoskeletal  (+) Arthritis ,   Abdominal   Peds  Hematology negative hematology ROS (+)   Anesthesia Other Findings Covid neg 02/19/19   Reproductive/Obstetrics                            Anesthesia Physical Anesthesia Plan  ASA: II  Anesthesia Plan: General   Post-op Pain Management:    Induction: Intravenous  PONV Risk Score and Plan: 2 and Treatment may vary due to age or medical condition and Ondansetron  Airway Management Planned: Oral ETT  Additional Equipment: None  Intra-op Plan:   Post-operative Plan: Extubation in OR  Informed Consent: I have reviewed the patients History and Physical, chart, labs and discussed the procedure including the risks, benefits and alternatives for the proposed anesthesia with the patient or authorized representative who has indicated his/her understanding and acceptance.     Dental advisory given  Plan Discussed with: CRNA and Anesthesiologist  Anesthesia Plan Comments:        Anesthesia Quick Evaluation

## 2019-02-23 NOTE — Anesthesia Postprocedure Evaluation (Signed)
Anesthesia Post Note  Patient: Mark Bowen  Procedure(s) Performed: ENDOSCOPIC RETROGRADE CHOLANGIOPANCREATOGRAPHY (ERCP) (N/A ) STENT REMOVAL     Patient location during evaluation: PACU Anesthesia Type: General Level of consciousness: awake and alert Pain management: pain level controlled Vital Signs Assessment: post-procedure vital signs reviewed and stable Respiratory status: spontaneous breathing, nonlabored ventilation and respiratory function stable Cardiovascular status: blood pressure returned to baseline and stable Postop Assessment: no apparent nausea or vomiting Anesthetic complications: no    Last Vitals:  Vitals:   02/23/19 1211 02/23/19 1220  BP:  137/70  Pulse: 67 65  Resp: 12 14  Temp:    SpO2: 93% 93%    Last Pain:  Vitals:   02/23/19 1220  TempSrc:   PainSc: 0-No pain                 Audry Pili

## 2019-02-25 ENCOUNTER — Encounter: Payer: Self-pay | Admitting: *Deleted

## 2019-02-26 NOTE — Pre-Procedure Instructions (Signed)
[id number] [address], [place] [address] Phone: [phone] Fax: [phone]      Your procedure is scheduled on Monday February 22nd.  Report to [facility] Main Entrance "A" at 5:30 A.M., and check in at the Admitting office.  Call this number if you have problems the morning of surgery:  [phone]  Call [phone] if you have any questions prior to your surgery date Monday-Friday 8am-4pm    Remember:  Do not eat or drink after midnight the night before your surgery    Take these medicines the morning of surgery with A SIP OF WATER  omeprazole (PRILOSEC) Carboxymethylcellul-Glycerin (LUBRICATING EYE DROPS OP) if needed HYDROcodone-acetaminophen (NORCO/VICODIN) if needed  Follow your surgeon's instructions on when to stop Asprin.  If no instructions were given by your surgeon then you will need to call the office to get those instructions.     7 days prior to surgery STOP taking any Aspirin (unless otherwise instructed by your surgeon), Aleve, Naproxen, Ibuprofen, Motrin, Advil, Goody's, BC's, all herbal medications, fish oil, and all vitamins.   HOW TO MANAGE YOUR DIABETES BEFORE AND AFTER SURGERY  Why is it important to control my blood sugar before and after surgery? . Improving blood sugar levels before and after surgery helps healing and can limit problems. . A way of improving blood sugar control is eating a healthy diet by: o  Eating less sugar and carbohydrates o  Increasing activity/exercise o  Talking with your doctor about reaching your blood sugar goals . High blood sugars (greater than 180 mg/dL) can raise your risk of infections and slow your recovery, so you will need to focus on controlling your diabetes during the weeks before surgery. . Make sure that the doctor who takes care of your diabetes knows about your planned surgery including the date and location.  How do I manage my blood  sugar before surgery? . Check your blood sugar at least 4 times a day, starting 2 days before surgery, to make sure that the level is not too high or low. o Check your blood sugar the morning of your surgery when you wake up and every 2 hours until you get to the Short Stay unit. . If your blood sugar is less than 70 mg/dL, you will need to treat for low blood sugar: o Do not take insulin. o Treat a low blood sugar (less than 70 mg/dL) with  cup of clear juice (cranberry or apple), 4 glucose tablets, OR glucose gel. Recheck blood sugar in 15 minutes after treatment (to make sure it is greater than 70 mg/dL). If your blood sugar is not greater than 70 mg/dL on recheck, call [phone] o  for further instructions. . Report your blood sugar to the short stay nurse when you get to Short Stay.  . If you are admitted to the hospital after surgery: o Your blood sugar will be checked by the staff and you will probably be given insulin after surgery (instead of oral diabetes medicines) to make sure you have good blood sugar levels. o The goal for blood sugar control after surgery is 80-180 mg/dL.     WHAT DO I DO ABOUT MY DIABETES MEDICATION?   Marland Kitchen Do not take oral diabetes medicines (pills): metFORMIN (GLUCOPHAGE-XR) or sitaGLIPtin (JANUVIA)  the morning of surgery.    The Morning of Surgery  Do not wear jewelry, make-up or nail polish.  Do not wear lotions, powders,  or perfumes/colognes, or deodorant  Do not shave 48 hours prior to surgery.  Men may shave face and neck.  Do not bring valuables to the hospital.  [facility] is not responsible for any belongings or valuables.  If you are a smoker, DO NOT Smoke 24 hours prior to surgery  If you wear a CPAP at night please bring your mask the morning of surgery   Remember that you must have someone to transport you home after your surgery, and remain with you for 24 hours if you are discharged the same day.   Please bring cases for  contacts, glasses, hearing aids, dentures or bridgework because it cannot be worn into surgery.    Leave your suitcase in the car.  After surgery it may be brought to your room.  For patients admitted to the hospital, discharge time will be determined by your treatment team.  Patients discharged the day of surgery will not be allowed to drive home.    Special instructions:   - Preparing For Surgery  Before surgery, you can play an important role. Because skin is not sterile, your skin needs to be as free of germs as possible. You can reduce the number of germs on your skin by washing with CHG (chlorahexidine gluconate) Soap before surgery.  CHG is an antiseptic cleaner which kills germs and bonds with the skin to continue killing germs even after washing.    Oral Hygiene is also important to reduce your risk of infection.  Remember - BRUSH YOUR TEETH THE MORNING OF SURGERY WITH YOUR REGULAR TOOTHPASTE  Please do not use if you have an allergy to CHG or antibacterial soaps. If your skin becomes reddened/irritated stop using the CHG.  Do not shave (including legs and underarms) for at least 48 hours prior to first CHG shower. It is OK to shave your face.  Please follow these instructions carefully.   1. Shower the NIGHT BEFORE SURGERY and the MORNING OF SURGERY with CHG Soap.   2. If you chose to wash your hair, wash your hair first as usual with your normal shampoo.  3. After you shampoo, rinse your hair and body thoroughly to remove the shampoo.  4. Use CHG as you would any other liquid soap. You can apply CHG directly to the skin and wash gently with a scrungie or a clean washcloth.   5. Apply the CHG Soap to your body ONLY FROM THE NECK DOWN.  Do not use on open wounds or open sores. Avoid contact with your eyes, ears, mouth and genitals (private parts). Wash Face and genitals (private parts)  with your normal soap.   6. Wash thoroughly, paying special attention to the  area where your surgery will be performed.  7. Thoroughly rinse your body with warm water from the neck down.  8. DO NOT shower/wash with your normal soap after using and rinsing off the CHG Soap.  9. Pat yourself dry with a CLEAN TOWEL.  10. Wear CLEAN PAJAMAS to bed the night before surgery, wear comfortable clothes the morning of surgery  11. Place CLEAN SHEETS on your bed the night of your first shower and DO NOT SLEEP WITH PETS.    Day of Surgery:  Please shower the morning of surgery with the CHG soap Do not apply any deodorants/lotions. Please wear clean clothes to the hospital/surgery center.   Remember to brush your teeth WITH YOUR REGULAR TOOTHPASTE.   Please read over the following fact sheets  that you were given.

## 2019-02-27 ENCOUNTER — Other Ambulatory Visit: Payer: Self-pay

## 2019-02-27 ENCOUNTER — Other Ambulatory Visit (HOSPITAL_COMMUNITY)
Admission: RE | Admit: 2019-02-27 | Discharge: 2019-02-27 | Disposition: A | Discharge status: Home / Self Care | Payer: Medicare Other | Source: Ambulatory Visit | Attending: Neurological Surgery | Admitting: Neurological Surgery

## 2019-02-27 ENCOUNTER — Encounter (HOSPITAL_COMMUNITY)
Admission: RE | Admit: 2019-02-27 | Discharge: 2019-02-27 | Disposition: A | Discharge status: Home / Self Care | Payer: Medicare Other | Source: Ambulatory Visit

## 2019-02-27 ENCOUNTER — Ambulatory Visit (HOSPITAL_COMMUNITY)
Admission: RE | Admit: 2019-02-27 | Discharge: 2019-02-27 | Disposition: A | Discharge status: Home / Self Care | Payer: Medicare Other | Source: Ambulatory Visit | Attending: Neurological Surgery | Admitting: Neurological Surgery

## 2019-02-27 ENCOUNTER — Encounter (HOSPITAL_COMMUNITY): Payer: Self-pay

## 2019-02-27 DIAGNOSIS — Z20822 Contact with and (suspected) exposure to covid-19: Secondary | ICD-10-CM | POA: Insufficient documentation

## 2019-02-27 DIAGNOSIS — M542 Cervicalgia: Secondary | ICD-10-CM | POA: Insufficient documentation

## 2019-02-27 DIAGNOSIS — Z01812 Encounter for preprocedural laboratory examination: Secondary | ICD-10-CM | POA: Insufficient documentation

## 2019-02-27 DIAGNOSIS — Z87891 Personal history of nicotine dependence: Secondary | ICD-10-CM | POA: Insufficient documentation

## 2019-02-27 DIAGNOSIS — I10 Essential (primary) hypertension: Secondary | ICD-10-CM | POA: Insufficient documentation

## 2019-02-27 DIAGNOSIS — E118 Type 2 diabetes mellitus with unspecified complications: Secondary | ICD-10-CM | POA: Insufficient documentation

## 2019-02-27 LAB — BASIC METABOLIC PANEL
Anion gap: 10 (ref 5–15)
BUN: 10 mg/dL (ref 8–23)
CO2: 26 mmol/L (ref 22–32)
Calcium: 8.8 mg/dL — ABNORMAL LOW (ref 8.9–10.3)
Chloride: 99 mmol/L (ref 98–111)
Creatinine, Ser: 0.92 mg/dL (ref 0.61–1.24)
GFR calc Af Amer: >60 mL/min (ref >60)
GFR calc non Af Amer: >60 mL/min (ref >60)
Glucose, Bld: 216 mg/dL — ABNORMAL HIGH (ref 70–99)
Potassium: 3.8 mmol/L (ref 3.5–5.1)
Sodium: 135 mmol/L (ref 135–145)

## 2019-02-27 LAB — CBC WITH DIFFERENTIAL/PLATELET
Abs Immature Granulocytes: 0.03 10*3/uL (ref 0.00–0.07)
Basophils Absolute: 0.1 10*3/uL (ref 0.0–0.1)
Basophils Relative: 1 %
Eosinophils Absolute: 0.1 10*3/uL (ref 0.0–0.5)
Eosinophils Relative: 2 %
HCT: 42.1 % (ref 39.0–52.0)
Hemoglobin: 14 g/dL (ref 13.0–17.0)
Immature Granulocytes: 1 %
Lymphocytes Relative: 24 %
Lymphs Abs: 1.6 10*3/uL (ref 0.7–4.0)
MCH: 29 pg (ref 26.0–34.0)
MCHC: 33.3 g/dL (ref 30.0–36.0)
MCV: 87.3 fL (ref 80.0–100.0)
Monocytes Absolute: 0.5 10*3/uL (ref 0.1–1.0)
Monocytes Relative: 8 %
Neutro Abs: 4.3 10*3/uL (ref 1.7–7.7)
Neutrophils Relative %: 64 %
Platelets: 255 10*3/uL (ref 150–400)
RBC: 4.82 MIL/uL (ref 4.22–5.81)
RDW: 12.8 % (ref 11.5–15.5)
WBC: 6.6 10*3/uL (ref 4.0–10.5)
nRBC: 0 % (ref 0.0–0.2)

## 2019-02-27 LAB — TYPE AND SCREEN
ABO/RH(D): O POS
Antibody Screen: NEGATIVE

## 2019-02-27 LAB — ABO/RH: ABO/RH(D): O POS

## 2019-02-27 LAB — SARS CORONAVIRUS 2 (TAT 6-24 HRS): SARS Coronavirus 2: NEGATIVE

## 2019-02-27 LAB — SURGICAL PCR SCREEN
MRSA, PCR: NEGATIVE
Staphylococcus aureus: NEGATIVE

## 2019-02-27 LAB — PROTIME-INR
INR: 1 (ref 0.8–1.2)
Prothrombin Time: 13.1 seconds (ref 11.4–15.2)

## 2019-02-27 LAB — GLUCOSE, CAPILLARY: Glucose-Capillary: 177 mg/dL — ABNORMAL HIGH (ref 70–99)

## 2019-02-27 MED ORDER — OXYCODONE HCL 5 MG PO TABS: 5.0000 mg | ORAL_TABLET | Freq: Once | ORAL | Status: DC | PRN

## 2019-02-27 MED ORDER — ONDANSETRON HCL 4 MG/2ML IJ SOLN: 4.0000 mg | Freq: Once | INTRAMUSCULAR | Status: DC | PRN

## 2019-02-27 MED ORDER — FENTANYL CITRATE (PF) 100 MCG/2ML IJ SOLN: 25.0000 ug | INTRAMUSCULAR | Status: DC | PRN

## 2019-02-27 MED ORDER — OXYCODONE HCL 5 MG/5ML PO SOLN: 5.0000 mg | Freq: Once | ORAL | Status: DC | PRN

## 2019-02-27 MED ORDER — PROMETHAZINE HCL 25 MG/ML IJ SOLN: 6.2500 mg | INTRAMUSCULAR | Status: DC | PRN

## 2019-02-27 NOTE — Progress Notes (Signed)
PCP - DR. Perini  Cardiologist - no  Chest x-ray - 02/26/2017  EKG -   Stress Test -   ECHO -   Cardiac Cath -   AICD- PM- LOOP-  Sleep Study - no CPAP - no  LABS-  ASA-stopped > than 1.5 weeks ago  ERAS-  HA1C- Fasting Blood Sugar -  Checks Blood Sugar _____ times a day  Anesthesia-  Pt denies having chest pain, sob, or fever at this time. All instructions explained to the pt, with a verbal understanding of the material. Pt agrees to go over the instructions while at home for a better understanding. Pt also instructed to self quarantine after being tested for COVID-19. The opportunity to ask questions was provided.

## 2019-02-27 NOTE — Progress Notes (Signed)
PCP - DR. Perini  Cardiologist - no  Chest x-ray - 02/26/2017  EKG - 01/12/2019  Stress Test - no  ECHO - no  Cardiac Cath - no Sleep Study - no CPAP - no  LABS-CBC with Diff, BMP, PT, T/S  ASA-stopped > than 1.5 weeks ago  ERAS-no  HA1C-6.9 01/12/2019 Fasting Blood Sugar -134 Checks Blood Sugar 1 time a week  Anesthesia-  Pt denies having chest pain, sob, or fever at this time. All instructions explained to the pt, with a verbal understanding of the material. Pt agrees to go over the instructions while at home for a better understanding. Pt also instructed to self quarantine after being tested for COVID-19. The opportunity to ask questions was provided.

## 2019-02-27 NOTE — Pre-Procedure Instructions (Signed)
Your procedure is scheduled on Monday February 22nd.  Report to [facility] Main Entrance "A" at 5:30 A.M., and check in at the Admitting office.  Call this number if you have problems the morning of surgery:  [phone] pre- surgery  Call [phone] if you have any questions prior to your surgery date Monday-Friday 8am-4pm   Remember:  Do not eat or drink after midnight the night before your surgery   Take these medicines the morning of surgery with A SIP OF WATER  omeprazole (PRILOSEC) Carboxymethylcellul-Glycerin   (LUBRICATING EYE DROPS OP) if needed HYDROcodone-acetaminophen (NORCO/VICODIN) if needed  Follow your surgeon's instructions on when to stop Asprin.  If no instructions were given by your surgeon then you will need to call the office to get those instructions.     7 days prior to surgery STOP taking any Aspirin (unless otherwise instructed by your surgeon), Aleve, Naproxen, Ibuprofen, Motrin, Advil, Goody's, BC's, all herbal medications, fish oil, and all vitamins.  WHAT DO I DO ABOUT MY DIABETES MEDICATION?  Marland Kitchen Do not take oral diabetes medicines (pills): metFORMIN (GLUCOPHAGE-XR) or sitaGLIPtin (JANUVIA)  the morning of surgery.   HOW TO MANAGE YOUR DIABETES BEFORE AND AFTER SURGERY  Why is it important to control my blood sugar before and after surgery? . Improving blood sugar levels before and after surgery helps healing and can limit problems. . A way of improving blood sugar control is eating a healthy diet by: o  Eating less sugar and carbohydrates o  Increasing activity/exercise o  Talking with your doctor about reaching your blood sugar goals . High blood sugars (greater than 180 mg/dL) can raise your risk of infections and slow your recovery, so you will need to focus on controlling your diabetes during the weeks before surgery. . Make sure that the doctor who takes care of your diabetes knows about your planned surgery including the date and  location.  How do I manage my blood sugar before surgery? . Check your blood sugar at least 4 times a day, starting 2 days before surgery, to make sure that the level is not too high or low. o Check your blood sugar the morning of your surgery when you wake up and every 2 hours until you get to the Short Stay unit. . If your blood sugar is less than 70 mg/dL, you will need to treat for low blood sugar: o Do not take insulin. o Treat a low blood sugar (less than 70 mg/dL) with  cup of clear juice (cranberry or apple), 4 glucose tablets, OR glucose gel. Recheck blood sugar in 15 minutes after treatment (to make sure it is greater than 70 mg/dL). If your blood sugar is not greater than 70 mg/dL on recheck, call [phone] o  for further instructions. . Report your blood sugar to the short stay nurse when you get to Short Stay.  . If you are admitted to the hospital after surgery: o Your blood sugar will be checked by the staff and you will probably be given insulin after surgery (instead of oral diabetes medicines) to make sure you have good blood sugar levels. o The goal for blood sugar con o trol after surgery is 80-180 mg/dL. o  Special instructions:   [place]- Preparing For Surgery  Before surgery, you can play an important role. Because skin is not sterile, your skin needs to be as free of germs as possible. You can reduce the number of germs on your skin  by washing with CHG (chlorahexidine gluconate) Soap before surgery.  CHG is an antiseptic cleaner which kills germs and bonds with the skin to continue killing germs even after washing.    Oral Hygiene is also important to reduce your risk of infection.  Remember - BRUSH YOUR TEETH THE MORNING OF SURGERY WITH YOUR REGULAR TOOTHPASTE  Please do not use if you have an allergy to CHG or antibacterial soaps. If your skin becomes reddened/irritated stop using the CHG.  Do not shave (including legs and underarms) for at least 48 hours  prior to first CHG shower. It is OK to shave your face.  Please follow these instructions carefully.   1. Shower the NIGHT BEFORE SURGERY and the MORNING OF SURGERY with CHG Soap.   2. If you chose to wash your hair, wash your hair first as usual with your normal shampoo.  3. After you shampoo, wash your face and private area with the soap you use at home, then rinse your hair and body thoroughly to remove the shampoo and soap.   4. Use CHG as you would any other liquid soap. You can apply CHG directly to the skin and wash gently with a scrungie or a clean washcloth.   Apply the CHG Soap to your body ONLY FROM THE NECK DOWN.  Do not use on open wounds or open sores. Avoid contact with your eyes, ears, mouth and genitals (private parts). Wash Face and  5. Wash thoroughly, paying special attention to the area where your surgery will be performed.  6. Thoroughly rinse your body with warm water from the neck down.  7. DO NOT shower/wash with your normal soap after using and rinsing off the CHG Soap.  8. Pat yourself dry with a CLEAN TOWEL.  9. Wear CLEAN PAJAMAS to bed the night before surgery, wear comfortable clothes the morning of surgery  10. Place CLEAN SHEETS on your bed the night of your first shower and DO NOT SLEEP WITH PETS.  Day of Surgery: Shower as instructed above.Please shower the morning of surgery with the CHG soap Do not apply any deodorants/lotions, powders or colognes. Please wear clean clothes to the hospital/surgery center.   Remember to brush your teeth WITH YOUR REGULAR TOOTHPASTE.  Do not wear jewelry, make-up or nail polish.  Men may shave face and neck.  Do not bring valuables to the hospital.  [facility] is not responsible for any belongings or valuables.  If you are a smoker, DO NOT Smoke 24 hours prior to surgery  If you wear a CPAP at night please bring your mask the morning of surgery   Remember that you must have someone to transport you home after  your surgery, and remain with you for 24 hours if you are discharged the same day.   Please bring cases for contacts, glasses, hearing aids, dentures or bridgework because it cannot be worn into surgery.    Leave your suitcase in the car.  After surgery it may be brought to your room.  For patients admitted to the hospital, discharge time will be determined by your treatment team.  Patients discharged the day of surgery will not be allowed to drive home.   Please read over the following fact sheets that you were given.

## 2019-03-02 ENCOUNTER — Inpatient Hospital Stay (HOSPITAL_COMMUNITY): Payer: Medicare Other | Admitting: Certified Registered Nurse Anesthetist

## 2019-03-02 ENCOUNTER — Inpatient Hospital Stay (HOSPITAL_COMMUNITY): Payer: Medicare Other

## 2019-03-02 ENCOUNTER — Inpatient Hospital Stay (HOSPITAL_COMMUNITY)
Admission: RE | Admit: 2019-03-02 | Discharge: 2019-03-03 | DRG: 473 | Disposition: A | Discharge status: Home / Self Care | Payer: Medicare Other | Attending: Neurological Surgery | Admitting: Neurological Surgery

## 2019-03-02 ENCOUNTER — Other Ambulatory Visit: Payer: Self-pay

## 2019-03-02 ENCOUNTER — Encounter (HOSPITAL_COMMUNITY): Payer: Self-pay | Admitting: Neurological Surgery

## 2019-03-02 ENCOUNTER — Encounter (HOSPITAL_COMMUNITY)
Admission: RE | Disposition: A | Discharge status: Home / Self Care | Payer: Self-pay | Source: Home / Self Care | Attending: Neurological Surgery

## 2019-03-02 DIAGNOSIS — Z7984 Long term (current) use of oral hypoglycemic drugs: Secondary | ICD-10-CM

## 2019-03-02 DIAGNOSIS — K219 Gastro-esophageal reflux disease without esophagitis: Secondary | ICD-10-CM | POA: Diagnosis present

## 2019-03-02 DIAGNOSIS — Z8546 Personal history of malignant neoplasm of prostate: Secondary | ICD-10-CM

## 2019-03-02 DIAGNOSIS — E7800 Pure hypercholesterolemia, unspecified: Secondary | ICD-10-CM | POA: Diagnosis present

## 2019-03-02 DIAGNOSIS — I10 Essential (primary) hypertension: Secondary | ICD-10-CM | POA: Diagnosis present

## 2019-03-02 DIAGNOSIS — M47812 Spondylosis without myelopathy or radiculopathy, cervical region: Secondary | ICD-10-CM | POA: Diagnosis present

## 2019-03-02 DIAGNOSIS — M50223 Other cervical disc displacement at C6-C7 level: Secondary | ICD-10-CM | POA: Diagnosis present

## 2019-03-02 DIAGNOSIS — E119 Type 2 diabetes mellitus without complications: Secondary | ICD-10-CM | POA: Diagnosis present

## 2019-03-02 DIAGNOSIS — Z79899 Other long term (current) drug therapy: Secondary | ICD-10-CM | POA: Diagnosis not present

## 2019-03-02 DIAGNOSIS — Z20822 Contact with and (suspected) exposure to covid-19: Secondary | ICD-10-CM | POA: Diagnosis present

## 2019-03-02 DIAGNOSIS — Z87891 Personal history of nicotine dependence: Secondary | ICD-10-CM | POA: Diagnosis not present

## 2019-03-02 DIAGNOSIS — Z7982 Long term (current) use of aspirin: Secondary | ICD-10-CM

## 2019-03-02 DIAGNOSIS — M4802 Spinal stenosis, cervical region: Secondary | ICD-10-CM | POA: Diagnosis present

## 2019-03-02 DIAGNOSIS — Z981 Arthrodesis status: Secondary | ICD-10-CM

## 2019-03-02 DIAGNOSIS — M25512 Pain in left shoulder: Secondary | ICD-10-CM | POA: Diagnosis present

## 2019-03-02 DIAGNOSIS — Z419 Encounter for procedure for purposes other than remedying health state, unspecified: Secondary | ICD-10-CM

## 2019-03-02 HISTORY — PX: ANTERIOR CERVICAL DECOMP/DISCECTOMY FUSION: SHX1161

## 2019-03-02 LAB — GLUCOSE, CAPILLARY
Glucose-Capillary: 139 mg/dL — ABNORMAL HIGH (ref 70–99)
Glucose-Capillary: 148 mg/dL — ABNORMAL HIGH (ref 70–99)
Glucose-Capillary: 248 mg/dL — ABNORMAL HIGH (ref 70–99)
Glucose-Capillary: 151 mg/dL — ABNORMAL HIGH (ref 70–99)
Glucose-Capillary: 179 mg/dL — ABNORMAL HIGH (ref 70–99)

## 2019-03-02 SURGERY — ANTERIOR CERVICAL DECOMPRESSION/DISCECTOMY FUSION 2 LEVELS
Anesthesia: General | Site: Spine Cervical

## 2019-03-02 MED ORDER — ONDANSETRON HCL 4 MG/2ML IJ SOLN
INTRAMUSCULAR | Status: DC | PRN
  Administered 2019-03-02: 4 mg via INTRAVENOUS

## 2019-03-02 MED ORDER — SODIUM CHLORIDE 0.9 % IV SOLN
INTRAVENOUS | Status: DC | PRN
  Administered 2019-03-02: 500 mL

## 2019-03-02 MED ORDER — CEFAZOLIN SODIUM-DEXTROSE 2-4 GM/100ML-% IV SOLN
2.0000 g | Freq: Three times a day (TID) | INTRAVENOUS | Status: AC
  Administered 2019-03-02 (×2): 2 g via INTRAVENOUS
  Filled 2019-03-02 (×2): qty 100

## 2019-03-02 MED ORDER — LACTATED RINGERS IV SOLN: INTRAVENOUS | Status: DC | PRN

## 2019-03-02 MED ORDER — LIDOCAINE 2% (20 MG/ML) 5 ML SYRINGE
INTRAMUSCULAR | Status: DC | PRN
  Administered 2019-03-02: 50 mg via INTRAVENOUS

## 2019-03-02 MED ORDER — FENTANYL CITRATE (PF) 250 MCG/5ML IJ SOLN
INTRAMUSCULAR | Status: AC
  Filled 2019-03-02: qty 5

## 2019-03-02 MED ORDER — THROMBIN 5000 UNITS EX SOLR
OROMUCOSAL | Status: DC | PRN
  Administered 2019-03-02: 5 mL via TOPICAL

## 2019-03-02 MED ORDER — FENTANYL CITRATE (PF) 250 MCG/5ML IJ SOLN
INTRAMUSCULAR | Status: DC | PRN
  Administered 2019-03-02: 100 ug via INTRAVENOUS
  Administered 2019-03-02 (×3): 50 ug via INTRAVENOUS

## 2019-03-02 MED ORDER — 0.9 % SODIUM CHLORIDE (POUR BTL) OPTIME
TOPICAL | Status: DC | PRN
  Administered 2019-03-02: 1000 mL

## 2019-03-02 MED ORDER — CEFAZOLIN SODIUM-DEXTROSE 2-4 GM/100ML-% IV SOLN
2.0000 g | INTRAVENOUS | Status: AC
  Administered 2019-03-02: 2 g via INTRAVENOUS
  Filled 2019-03-02: qty 100

## 2019-03-02 MED ORDER — PHENYLEPHRINE HCL-NACL 10-0.9 MG/250ML-% IV SOLN
INTRAVENOUS | Status: DC | PRN
  Administered 2019-03-02: 25 ug/min via INTRAVENOUS

## 2019-03-02 MED ORDER — PROPOFOL 10 MG/ML IV BOLUS
INTRAVENOUS | Status: DC | PRN
  Administered 2019-03-02: 90 mg via INTRAVENOUS

## 2019-03-02 MED ORDER — SODIUM CHLORIDE 0.9 % IV SOLN: 250.0000 mL | INTRAVENOUS | Status: DC

## 2019-03-02 MED ORDER — SODIUM CHLORIDE 0.9% FLUSH: 3.0000 mL | INTRAVENOUS | Status: DC | PRN

## 2019-03-02 MED ORDER — OXYCODONE HCL 5 MG/5ML PO SOLN: 5.0000 mg | Freq: Once | ORAL | Status: DC | PRN

## 2019-03-02 MED ORDER — METHOCARBAMOL 500 MG PO TABS
500.0000 mg | ORAL_TABLET | Freq: Four times a day (QID) | ORAL | Status: DC | PRN
  Administered 2019-03-02 (×2): 500 mg via ORAL
  Filled 2019-03-02 (×2): qty 1

## 2019-03-02 MED ORDER — THROMBIN 5000 UNITS EX SOLR
CUTANEOUS | Status: DC | PRN
  Administered 2019-03-02 (×2): 5000 [IU] via TOPICAL

## 2019-03-02 MED ORDER — POTASSIUM CHLORIDE IN NACL 20-0.9 MEQ/L-% IV SOLN: INTRAVENOUS | Status: DC

## 2019-03-02 MED ORDER — SODIUM CHLORIDE 0.9% FLUSH
3.0000 mL | Freq: Two times a day (BID) | INTRAVENOUS | Status: DC
  Administered 2019-03-02: 21:00:00 3 mL via INTRAVENOUS

## 2019-03-02 MED ORDER — SENNA 8.6 MG PO TABS
1.0000 | ORAL_TABLET | Freq: Two times a day (BID) | ORAL | Status: DC
  Administered 2019-03-02 (×2): 8.6 mg via ORAL
  Filled 2019-03-02 (×2): qty 1

## 2019-03-02 MED ORDER — ACETAMINOPHEN 325 MG PO TABS: 650.0000 mg | ORAL_TABLET | ORAL | Status: DC | PRN

## 2019-03-02 MED ORDER — ACETAMINOPHEN 650 MG RE SUPP: 650.0000 mg | RECTAL | Status: DC | PRN

## 2019-03-02 MED ORDER — ONDANSETRON HCL 4 MG/2ML IJ SOLN: 4.0000 mg | Freq: Four times a day (QID) | INTRAMUSCULAR | Status: DC | PRN

## 2019-03-02 MED ORDER — DEXAMETHASONE SODIUM PHOSPHATE 10 MG/ML IJ SOLN
10.0000 mg | Freq: Once | INTRAMUSCULAR | Status: AC
  Administered 2019-03-02: 10 mg via INTRAVENOUS
  Filled 2019-03-02: qty 1

## 2019-03-02 MED ORDER — HEMOSTATIC AGENTS (NO CHARGE) OPTIME
TOPICAL | Status: DC | PRN
  Administered 2019-03-02: 1 via TOPICAL

## 2019-03-02 MED ORDER — OXYCODONE HCL 5 MG PO TABS: 5.0000 mg | ORAL_TABLET | Freq: Once | ORAL | Status: DC | PRN

## 2019-03-02 MED ORDER — INSULIN ASPART 100 UNIT/ML ~~LOC~~ SOLN
0.0000 [IU] | Freq: Three times a day (TID) | SUBCUTANEOUS | Status: DC
  Administered 2019-03-02: 2 [IU] via SUBCUTANEOUS
  Administered 2019-03-02: 18:00:00 5 [IU] via SUBCUTANEOUS
  Administered 2019-03-03: 2 [IU] via SUBCUTANEOUS

## 2019-03-02 MED ORDER — BUPIVACAINE HCL (PF) 0.25 % IJ SOLN
INTRAMUSCULAR | Status: DC | PRN
  Administered 2019-03-02: 5 mL

## 2019-03-02 MED ORDER — ONDANSETRON HCL 4 MG PO TABS: 4.0000 mg | ORAL_TABLET | Freq: Four times a day (QID) | ORAL | Status: DC | PRN

## 2019-03-02 MED ORDER — PROPOFOL 10 MG/ML IV BOLUS
INTRAVENOUS | Status: AC
  Filled 2019-03-02: qty 40

## 2019-03-02 MED ORDER — FENTANYL CITRATE (PF) 100 MCG/2ML IJ SOLN
INTRAMUSCULAR | Status: AC
  Filled 2019-03-02: qty 2

## 2019-03-02 MED ORDER — ROCURONIUM BROMIDE 10 MG/ML (PF) SYRINGE
PREFILLED_SYRINGE | INTRAVENOUS | Status: DC | PRN
  Administered 2019-03-02: 50 mg via INTRAVENOUS

## 2019-03-02 MED ORDER — MENTHOL 3 MG MT LOZG: 1.0000 | LOZENGE | OROMUCOSAL | Status: DC | PRN

## 2019-03-02 MED ORDER — METHOCARBAMOL 1000 MG/10ML IJ SOLN
500.0000 mg | Freq: Four times a day (QID) | INTRAVENOUS | Status: DC | PRN
  Filled 2019-03-02: qty 5

## 2019-03-02 MED ORDER — LINAGLIPTIN 5 MG PO TABS
5.0000 mg | ORAL_TABLET | Freq: Every day | ORAL | Status: DC
  Administered 2019-03-02: 14:00:00 5 mg via ORAL
  Filled 2019-03-02 (×2): qty 1

## 2019-03-02 MED ORDER — PHENOL 1.4 % MT LIQD: 1.0000 | OROMUCOSAL | Status: DC | PRN

## 2019-03-02 MED ORDER — BENAZEPRIL HCL 40 MG PO TABS
40.0000 mg | ORAL_TABLET | Freq: Every day | ORAL | Status: DC
  Administered 2019-03-02: 40 mg via ORAL
  Filled 2019-03-02 (×3): qty 1

## 2019-03-02 MED ORDER — CHLORHEXIDINE GLUCONATE CLOTH 2 % EX PADS: 6.0000 | MEDICATED_PAD | Freq: Once | CUTANEOUS | Status: DC

## 2019-03-02 MED ORDER — THROMBIN 5000 UNITS EX SOLR
CUTANEOUS | Status: AC
  Filled 2019-03-02: qty 15000

## 2019-03-02 MED ORDER — BUPIVACAINE HCL (PF) 0.25 % IJ SOLN
INTRAMUSCULAR | Status: AC
  Filled 2019-03-02: qty 30

## 2019-03-02 MED ORDER — FENTANYL CITRATE (PF) 100 MCG/2ML IJ SOLN
25.0000 ug | INTRAMUSCULAR | Status: DC | PRN
  Administered 2019-03-02: 25 ug via INTRAVENOUS
  Administered 2019-03-02 (×2): 50 ug via INTRAVENOUS
  Administered 2019-03-02: 25 ug via INTRAVENOUS

## 2019-03-02 MED ORDER — PHENYLEPHRINE HCL (PRESSORS) 10 MG/ML IV SOLN
INTRAVENOUS | Status: AC
  Filled 2019-03-02: qty 1

## 2019-03-02 MED ORDER — METFORMIN HCL ER 500 MG PO TB24
1000.0000 mg | ORAL_TABLET | Freq: Two times a day (BID) | ORAL | Status: DC
  Administered 2019-03-02 – 2019-03-03 (×2): 1000 mg via ORAL
  Filled 2019-03-02 (×3): qty 2

## 2019-03-02 MED ORDER — SUGAMMADEX SODIUM 200 MG/2ML IV SOLN
INTRAVENOUS | Status: DC | PRN
  Administered 2019-03-02: 200 mg via INTRAVENOUS

## 2019-03-02 MED ORDER — MORPHINE SULFATE (PF) 2 MG/ML IV SOLN: 2.0000 mg | INTRAVENOUS | Status: DC | PRN

## 2019-03-02 MED ORDER — HYDROCODONE-ACETAMINOPHEN 5-325 MG PO TABS
1.0000 | ORAL_TABLET | ORAL | Status: DC | PRN
  Administered 2019-03-02 – 2019-03-03 (×4): 1 via ORAL
  Filled 2019-03-02 (×4): qty 1

## 2019-03-02 SURGICAL SUPPLY — 54 items
BAG DECANTER FOR FLEXI CONT (MISCELLANEOUS) ×2 IMPLANT
BASKET BONE COLLECTION (BASKET) ×2 IMPLANT
BENZOIN TINCTURE PRP APPL 2/3 (GAUZE/BANDAGES/DRESSINGS) ×2 IMPLANT
BIT DRILL 2.3 12 FIXED (INSTRUMENTS) ×1 IMPLANT
BUR CARBIDE MATCH 3.0 (BURR) ×2 IMPLANT
CANISTER SUCT 3000ML PPV (MISCELLANEOUS) ×2 IMPLANT
CARTRIDGE OIL MAESTRO DRILL (MISCELLANEOUS) ×1 IMPLANT
CLSR STERI-STRIP ANTIMIC 1/2X4 (GAUZE/BANDAGES/DRESSINGS) ×2 IMPLANT
COVER WAND RF STERILE (DRAPES) ×2 IMPLANT
DIFFUSER DRILL AIR PNEUMATIC (MISCELLANEOUS) ×2 IMPLANT
DRAPE C-ARM 42X72 X-RAY (DRAPES) ×4 IMPLANT
DRAPE LAPAROTOMY 100X72 PEDS (DRAPES) ×2 IMPLANT
DRAPE MICROSCOPE LEICA (MISCELLANEOUS) ×2 IMPLANT
DRILL 12MM (INSTRUMENTS) ×2
DRSG OPSITE POSTOP 4X6 (GAUZE/BANDAGES/DRESSINGS) ×2 IMPLANT
DURAPREP 6ML APPLICATOR 50/CS (WOUND CARE) ×2 IMPLANT
ELECT COATED BLADE 2.86 ST (ELECTRODE) ×2 IMPLANT
ELECT REM PT RETURN 9FT ADLT (ELECTROSURGICAL) ×2
ELECTRODE REM PT RTRN 9FT ADLT (ELECTROSURGICAL) ×1 IMPLANT
GAUZE 4X4 16PLY RFD (DISPOSABLE) IMPLANT
GLOVE BIO SURGEON STRL SZ7 (GLOVE) ×2 IMPLANT
GLOVE BIO SURGEON STRL SZ8 (GLOVE) ×4 IMPLANT
GLOVE BIOGEL PI IND STRL 7.0 (GLOVE) ×2 IMPLANT
GLOVE BIOGEL PI IND STRL 7.5 (GLOVE) ×2 IMPLANT
GLOVE BIOGEL PI INDICATOR 7.0 (GLOVE) ×2
GLOVE BIOGEL PI INDICATOR 7.5 (GLOVE) ×2
GLOVE SURG SS PI 7.5 STRL IVOR (GLOVE) ×8 IMPLANT
GOWN STRL REUS W/ TWL LRG LVL3 (GOWN DISPOSABLE) ×1 IMPLANT
GOWN STRL REUS W/ TWL XL LVL3 (GOWN DISPOSABLE) ×3 IMPLANT
GOWN STRL REUS W/TWL 2XL LVL3 (GOWN DISPOSABLE) IMPLANT
GOWN STRL REUS W/TWL LRG LVL3 (GOWN DISPOSABLE) ×1
GOWN STRL REUS W/TWL XL LVL3 (GOWN DISPOSABLE) ×3
HEMOSTAT POWDER KIT SURGIFOAM (HEMOSTASIS) ×2 IMPLANT
KIT BASIN OR (CUSTOM PROCEDURE TRAY) ×2 IMPLANT
KIT TURNOVER KIT B (KITS) ×2 IMPLANT
NEEDLE HYPO 25X1 1.5 SAFETY (NEEDLE) ×2 IMPLANT
NEEDLE SPNL 20GX3.5 QUINCKE YW (NEEDLE) ×2 IMPLANT
NS IRRIG 1000ML POUR BTL (IV SOLUTION) ×2 IMPLANT
OIL CARTRIDGE MAESTRO DRILL (MISCELLANEOUS) ×2
PACK LAMINECTOMY NEURO (CUSTOM PROCEDURE TRAY) ×2 IMPLANT
PAD ARMBOARD 7.5X6 YLW CONV (MISCELLANEOUS) ×6 IMPLANT
PIN DISTRACTION 14MM (PIN) ×4 IMPLANT
PLATE TRESTLE LUXE 32L L2 (Plate) ×2 IMPLANT
RUBBERBAND STERILE (MISCELLANEOUS) ×4 IMPLANT
SCREW 12 (Screw) ×12 IMPLANT
SPACER ASSEM CERV LORD 7M (Spacer) ×4 IMPLANT
SPONGE INTESTINAL PEANUT (DISPOSABLE) ×2 IMPLANT
SPONGE SURGIFOAM ABS GEL SZ50 (HEMOSTASIS) ×2 IMPLANT
STRIP CLOSURE SKIN 1/2X4 (GAUZE/BANDAGES/DRESSINGS) ×2 IMPLANT
SUT VIC AB 3-0 SH 8-18 (SUTURE) ×6 IMPLANT
SUT VICRYL 4-0 PS2 18IN ABS (SUTURE) ×2 IMPLANT
TOWEL GREEN STERILE (TOWEL DISPOSABLE) ×2 IMPLANT
TOWEL GREEN STERILE FF (TOWEL DISPOSABLE) ×2 IMPLANT
WATER STERILE IRR 1000ML POUR (IV SOLUTION) ×2 IMPLANT

## 2019-03-02 NOTE — Anesthesia Preprocedure Evaluation (Signed)
Anesthesia Evaluation  Patient identified by MRN, date of birth, ID band Patient awake    Reviewed: Allergy & Precautions, H&P , NPO status , Patient's Chart, lab work & pertinent test results  Airway Mallampati: II   Neck ROM: full    Dental   Pulmonary former smoker,    breath sounds clear to auscultation       Cardiovascular hypertension,  Rhythm:regular Rate:Normal     Neuro/Psych    GI/Hepatic GERD  ,  Endo/Other  diabetes, Type 2  Renal/GU      Musculoskeletal  (+) Arthritis ,   Abdominal   Peds  Hematology   Anesthesia Other Findings   Reproductive/Obstetrics                             Anesthesia Physical Anesthesia Plan  ASA: II  Anesthesia Plan: General   Post-op Pain Management:    Induction: Intravenous  PONV Risk Score and Plan: 2 and Ondansetron, Dexamethasone, Midazolam and Treatment may vary due to age or medical condition  Airway Management Planned: Oral ETT  Additional Equipment:   Intra-op Plan:   Post-operative Plan: Extubation in OR  Informed Consent: I have reviewed the patients History and Physical, chart, labs and discussed the procedure including the risks, benefits and alternatives for the proposed anesthesia with the patient or authorized representative who has indicated his/her understanding and acceptance.       Plan Discussed with: CRNA, Anesthesiologist and Surgeon  Anesthesia Plan Comments:         Anesthesia Quick Evaluation

## 2019-03-02 NOTE — Op Note (Signed)
03/02/2019  10:01 AM  PATIENT:  Mark Bowen  84 y.o. male  PRE-OPERATIVE DIAGNOSIS: Cervical spondylosis with neuroforaminal stenosis C5-6 and C6-7, neck and left shoulder and arm pain  POST-OPERATIVE DIAGNOSIS:  same  PROCEDURE:  1. Decompressive anterior cervical discectomy C5-6 and C6-7, 2. Anterior cervical arthrodesis C5-6 and C6-7 utilizing a cortical cancellus allograft bone graft, 3. Anterior cervical plating C5-6 and C6-7 utilizing a Alphatec plate  SURGEON:  Sherley Bounds, MD  ASSISTANTS: Glenford Peers, FNP  ANESTHESIA:   General  EBL: 30 ml  Total I/O In: -  Out: 30 [Blood:30]  BLOOD ADMINISTERED: none  DRAINS: none  SPECIMEN:  none  INDICATION FOR PROCEDURE: This patient presented with neck and left shoulder and arm pain. Imaging showed cervical spondylosis at C5-6 with neuroforaminal stenosis. The patient tried conservative measures without relief. Pain was debilitating. Recommended ACDF with plating. Patient understood the risks, benefits, and alternatives and potential outcomes and wished to proceed.  PROCEDURE DETAILS: Patient was brought to the operating room placed under general endotracheal anesthesia. Patient was placed in the supine position on the operating room table. The neck was prepped with Duraprep and draped in a sterile fashion.   Three cc of local anesthesia was injected and a transverse incision was made on the right side of the neck.  Dissection was carried down thru the subcutaneous tissue and the platysma was  elevated, opened, and undermined with Metzenbaum scissors.  Dissection was then carried out thru an avascular plane leaving the sternocleidomastoid carotid artery and jugular vein laterally and the trachea and esophagus medially with the assistance of my nurse practitioner. The ventral aspect of the vertebral column was identified and a localizing x-ray was taken. The C5-6 level was identified and all in the room agreed with the level.  the  longus colli muscles were then elevated and the retractor was placed with the assistance of my nurse practitioner to expose C5-6 and C6-7. The annulus was incised and the disc space entered.  The disc was completely collapsed.  Discectomy was performed with micro-curettes and pituitary rongeurs. I then used the high-speed drill to drill the endplates down to the level of the posterior longitudinal ligament.  The operating microscope was draped and brought into the field provided additional magnification, illumination and visualization. Discectomy was continued posteriorly thru the disc space. Posterior longitudinal ligament was opened with a nerve hook, and then removed along with disc herniation and osteophytes, decompressing the spinal canal and thecal sac. We then continued to remove osteophytic overgrowth and disc material decompressing the neural foramina and exiting nerve roots bilaterally. The scope was angled up and down to help decompress and undercut the vertebral bodies. Once the decompression was completed we could pass a nerve hook circumferentially to assure adequate decompression in the midline and in the neural foramina. So by both visualization and palpation we felt we had an adequate decompression of the neural elements. We then measured the height of the intravertebral disc space and selected a 7 millimeter cortical cancellus allograft for each interspace. It was then gently positioned in the intravertebral disc space(s) and countersunk. I then used a 32 mm Alphatec plate and placed variable angle screws into the vertebral bodies of each level and locked them into position. The wound was irrigated with bacitracin solution, checked for hemostasis which was established and confirmed. Once meticulous hemostasis was achieved, we then proceeded with closure with the assistance of my nurse practitioner. The platysma was closed with interrupted 3-0 undyed  Vicryl suture, the subcuticular layer was closed  with interrupted 3-0 undyed Vicryl suture. The skin edges were approximated with steristrips. The drapes were removed. A sterile dressing was applied. The patient was then awakened from general anesthesia and transferred to the recovery room in stable condition. At the end of the procedure all sponge, needle and instrument counts were correct.   PLAN OF CARE: Admit for overnight observation  PATIENT DISPOSITION:  PACU - hemodynamically stable.   Delay start of Pharmacological VTE agent (>24hrs) due to surgical blood loss or risk of bleeding:  yes

## 2019-03-02 NOTE — Anesthesia Procedure Notes (Signed)
Procedure Name: Intubation Date/Time: 03/02/2019 7:52 AM Performed by: Valda Favia, CRNA Pre-anesthesia Checklist: Patient identified, Emergency Drugs available, Suction available and Patient being monitored Patient Re-evaluated:Patient Re-evaluated prior to induction Oxygen Delivery Method: Circle System Utilized Preoxygenation: Pre-oxygenation with 100% oxygen Induction Type: IV induction Ventilation: Mask ventilation without difficulty Laryngoscope Size: Mac and 4 Grade View: Grade I Tube type: Oral Tube size: 7.5 mm Number of attempts: 1 Airway Equipment and Method: Stylet and Oral airway Placement Confirmation: ETT inserted through vocal cords under direct vision,  positive ETCO2 and breath sounds checked- equal and bilateral Secured at: 20 cm Tube secured with: Tape Dental Injury: Teeth and Oropharynx as per pre-operative assessment

## 2019-03-02 NOTE — Transfer of Care (Signed)
Immediate Anesthesia Transfer of Care Note  Patient: Mark Bowen  Procedure(s) Performed: ANTERIOR CERVICAL DECOMPRESSION FUSION - CERVICAL FIVE-CERVICAL SIX - CERVICAL SIX-CERVICAL SEVEN (N/A Spine Cervical)  Patient Location: PACU  Anesthesia Type:General  Level of Consciousness: awake, alert  and oriented  Airway & Oxygen Therapy: Patient Spontanous Breathing and Patient connected to nasal cannula oxygen  Post-op Assessment: Report given to RN and Post -op Vital signs reviewed and stable  Post vital signs: Reviewed and stable  Last Vitals:  Vitals Value Taken Time  BP 167/88 03/02/19 1010  Temp    Pulse 59 03/02/19 1015  Resp 10 03/02/19 1015  SpO2 100 % 03/02/19 1015  Vitals shown include unvalidated device data.  Last Pain:  Vitals:   03/02/19 0653  PainSc: 0-No pain         Complications: No apparent anesthesia complications

## 2019-03-02 NOTE — H&P (Signed)
Subjective:   Patient is a 84 y.o. male admitted for neck pain . The patient first presented to me with complaints of neck pain. Onset of symptoms was several months ago. The pain is described as aching and occurs all day. The pain is rated severe, and is located in the neck and radiates to the l INTRASCAPULAR REGION. The symptoms have been progressive. Symptoms are exacerbated by extending head backwards, and are relieved by none.  Previous work up includes MRI of cervical spine, results: spinal stenosis.  Past Medical History:  Diagnosis Date  . Acute cholecystitis 01/2019  . Arthritis   . Diabetes mellitus 2007   type 2  . Diverticulitis   . GERD (gastroesophageal reflux disease)    02/27/2019- not currently  . Hypercholesterolemia since 2008 preventative   taking Zocor, now cholesterol WNL  . Hypertension since 1970  . Prostate CA ([place])   . Shingles     Past Surgical History:  Procedure Laterality Date  . APPENDECTOMY  1960  . [place] & 2009   2009 - discectomy  . BILIARY STENT PLACEMENT  01/14/2019   Procedure: BILIARY STENT PLACEMENT;  Surgeon: Irene Shipper, MD;  Location: [place];  Service: Endoscopy;;  . CHOLECYSTECTOMY N/A 01/13/2019   Procedure: LAPAROSCOPIC SUBTOTAL CHOLECYSTECTOMY, WITH PLACEMENT OF DRAIN;  Surgeon: Ileana Roup, MD;  Location: [facility];  Service: General;  Laterality: N/A;  . COLONOSCOPY    . ERCP N/A 01/14/2019   Procedure: ENDOSCOPIC RETROGRADE CHOLANGIOPANCREATOGRAPHY (ERCP);  Surgeon: Irene Shipper, MD;  Location: [facility] ENDOSCOPY;  Service: Endoscopy;  Laterality: N/A;  . ERCP N/A 02/23/2019   Procedure: ENDOSCOPIC RETROGRADE CHOLANGIOPANCREATOGRAPHY (ERCP);  Surgeon: Irene Shipper, MD;  Location: [facility] ENDOSCOPY;  Service: Endoscopy;  Laterality: N/A;  . INGUINAL HERNIA REPAIR Bilateral 1970   bilateral  . KNEE SURGERY Right 11/2018  . PROSTATE SURGERY    . SPHINCTEROTOMY  01/14/2019   Procedure: SPHINCTEROTOMY;  Surgeon: Irene Shipper,  MD;  Location: [facility] ENDOSCOPY;  Service: Endoscopy;;  . Lavell Islam REMOVAL  02/23/2019   Procedure: STENT REMOVAL;  Surgeon: Irene Shipper, MD;  Location: [facility] ENDOSCOPY;  Service: Endoscopy;;  . TONSILLECTOMY  1960    Allergies  Allergen Reactions  . Iodine Hives  . Iohexol Hives         Social History   Tobacco Use  . Smoking status: Former Smoker    Quit date: 04/04/1959    Years since quitting: 59.9  . Smokeless tobacco: Never Used  Substance Use Topics  . Alcohol use: No    Comment: non achololic    Family History  Problem Relation Age of Onset  . Lung cancer Father   . Prostate cancer Brother        x 3 brothers  . Colon cancer Neg Hx   . Stomach cancer Neg Hx    Prior to Admission medications   Medication Sig Start Date End Date Taking? Authorizing Provider  benazepril (LOTENSIN) 40 MG tablet Take 40 mg by mouth daily.   Yes [provider]  Carboxymethylcellul-Glycerin (LUBRICATING EYE DROPS OP) Place 1 drop into both eyes daily as needed (irritation).   Yes [provider]  fish oil-omega-3 fatty acids 1000 MG capsule Take 1 g by mouth daily.    Yes [provider]  HYDROcodone-acetaminophen (NORCO/VICODIN) 5-325 MG tablet Take 1 tablet by mouth every 6 (six) hours as needed for moderate pain.   Yes [provider]  metFORMIN (GLUCOPHAGE-XR)  500 MG 24 hr tablet Take 1,000 mg by mouth 2 (two) times daily.   Yes [provider]  omeprazole (PRILOSEC) 20 MG capsule Take 20 mg by mouth daily.   Yes [provider]  simvastatin (ZOCOR) 20 MG tablet Take 20 mg by mouth at bedtime.   Yes [provider]  sitaGLIPtin (JANUVIA) 100 MG tablet Take 100 mg by mouth daily.   Yes [provider]  trolamine salicylate (ASPERCREME) 10 % cream Apply 1 application topically as needed for muscle pain.   Yes [provider]  aspirin 81 MG tablet Take 81 mg by mouth daily.    [provider]  benazepril  (LOTENSIN) 20 MG tablet Take 1 tablet (20 mg total) by mouth daily. Patient not taking: Reported on 02/23/2019 06/20/16   Reyne Dumas, MD  gabapentin (NEURONTIN) 600 MG tablet Take 0.5 tablets (300 mg total) by mouth 2 (two) times daily. Patient not taking: Reported on 02/23/2019 06/15/16 02/23/19  Reyne Dumas, MD  oxyCODONE (OXY IR/ROXICODONE) 5 MG immediate release tablet Take 1 tablet (5 mg total) by mouth every 6 (six) hours as needed for severe pain. Patient not taking: Reported on 02/18/2019 01/16/19   Margie Billet A, PA-C  polyethylene glycol (MIRALAX / GLYCOLAX) 17 g packet Take 17 g by mouth daily as needed for mild constipation. Patient not taking: Reported on 02/18/2019 01/16/19   Wellington Hampshire, PA-C  saccharomyces boulardii (FLORASTOR) 250 MG capsule Take 1 capsule (250 mg total) by mouth 2 (two) times daily. You can find a probiotic to take over the counter. Patient not taking: Reported on 02/18/2019 01/16/19   Wellington Hampshire, PA-C     Review of Systems  Positive ROS: neg  All other systems have been reviewed and were otherwise negative with the exception of those mentioned in the HPI and as above.  Objective: Vital signs in last 24 hours: Temp:  [98.2 F (36.8 C)] 98.2 F (36.8 C) (02/22 0620) Pulse Rate:  [55] 55 (02/22 0620) Resp:  [18] 18 (02/22 0620) BP: (183)/(84) 183/84 (02/22 0620) SpO2:  [98 %] 98 % (02/22 0620) Weight:  [71.7 kg] 71.7 kg (02/22 0620)  General Appearance: Alert, cooperative, no distress, appears stated age Head: Normocephalic, without obvious abnormality, atraumatic Eyes: PERRL, conjunctiva/corneas clear, EOM's intact      Neck: Supple, symmetrical, trachea midline, Back: Symmetric, no curvature, ROM normal, no CVA tenderness Lungs:  respirations unlabored Heart: Regular rate and rhythm Abdomen: Soft, non-tender Extremities: Extremities normal, atraumatic, no cyanosis or edema Pulses: 2+ and symmetric all extremities Skin: Skin color, texture,  turgor normal, no rashes or lesions  NEUROLOGIC:  Mental status: Alert and oriented x4, no aphasia, good attention span, fund of knowledge and memory  Motor Exam - grossly normal Sensory Exam - grossly normal Reflexes: 1+ Coordination - grossly normal Gait - grossly normal Balance - grossly normal Cranial Nerves: I: smell Not tested  II: visual acuity  OS: nl    OD: nl  II: visual fields Full to confrontation  II: pupils Equal, round, reactive to light  III,VII: ptosis None  III,IV,VI: extraocular muscles  Full ROM  V: mastication Normal  V: facial light touch sensation  Normal  V,VII: corneal reflex  Present  VII: facial muscle function - upper  Normal  VII: facial muscle function - lower Normal  VIII: hearing Not tested  IX: soft palate elevation  Normal  IX,X: gag reflex Present  XI: trapezius strength  5/5  XI:  sternocleidomastoid strength 5/5  XI: neck flexion strength  5/5  XII: tongue strength  Normal    Data Review Lab Results  Component Value Date   WBC 6.6 02/27/2019   HGB 14.0 02/27/2019   HCT 42.1 02/27/2019   MCV 87.3 02/27/2019   PLT 255 02/27/2019   Lab Results  Component Value Date   NA 135 02/27/2019   K 3.8 02/27/2019   CL 99 02/27/2019   CO2 26 02/27/2019   BUN 10 02/27/2019   CREATININE 0.92 02/27/2019   GLUCOSE 216 (H) 02/27/2019   Lab Results  Component Value Date   INR 1.0 02/27/2019    Assessment:   Cervical neck pain with herniated nucleus pulposus/ spondylosis/ stenosis at C5-6 C6-7. Estimated body mass index is 24.02 kg/m as calculated from the following:   Height as of this encounter: 5\' 8"  (1.727 m).   Weight as of this encounter: 71.7 kg.  Patient has failed conservative therapy. Planned surgery : ACDF C5-6 C6-7  Plan:   I explained the condition and procedure to the patient and answered any questions.  Patient wishes to proceed with procedure as planned. Understands risks/ benefits/ and expected or typical  outcomes.  Mark Bowen 03/02/2019 7:37 AM

## 2019-03-03 ENCOUNTER — Encounter: Payer: Self-pay | Admitting: *Deleted

## 2019-03-03 LAB — GLUCOSE, CAPILLARY: Glucose-Capillary: 132 mg/dL — ABNORMAL HIGH (ref 70–99)

## 2019-03-03 MED ORDER — HYDROCODONE-ACETAMINOPHEN 5-325 MG PO TABS: 1.0000 | ORAL_TABLET | Freq: Four times a day (QID) | ORAL | 0 refills | Status: DC | PRN

## 2019-03-03 NOTE — Plan of Care (Signed)
Pt doing well. Pt given D/C instructions with verbal understanding. Incision is clean and dry with no sign of infection. Pt's IV was removed prior to D/C. Pt D/C'd home via wheelchair per MD order. Pt is stable @ D/C and has no other needs at this time. Alberto Schoch, RN  

## 2019-03-03 NOTE — Anesthesia Postprocedure Evaluation (Signed)
Anesthesia Post Note  Patient: Mark Bowen  Procedure(s) Performed: ANTERIOR CERVICAL DECOMPRESSION FUSION - CERVICAL FIVE-CERVICAL SIX - CERVICAL SIX-CERVICAL SEVEN (N/A Spine Cervical)     Patient location during evaluation: PACU Anesthesia Type: General Level of consciousness: awake and alert Pain management: pain level controlled Vital Signs Assessment: post-procedure vital signs reviewed and stable Respiratory status: spontaneous breathing, nonlabored ventilation, respiratory function stable and patient connected to nasal cannula oxygen Cardiovascular status: blood pressure returned to baseline and stable Postop Assessment: no apparent nausea or vomiting Anesthetic complications: no    Last Vitals:  Vitals:   03/02/19 2304 03/03/19 0406  BP: 103/66 122/70  Pulse: 64 (!) 56  Resp: 18 18  Temp: 36.6 C 36.8 C  SpO2: 94% 97%    Last Pain:  Vitals:   03/03/19 0800  TempSrc:   PainSc: 2                  [name] S

## 2019-03-03 NOTE — Discharge Instructions (Signed)

## 2019-03-03 NOTE — Discharge Summary (Signed)
Physician Discharge Summary  Patient ID: Mark Bowen MRN: [id number] DOB/AGE: Jan 31, 1935 84 y.o.  Admit date: 03/02/2019 Discharge date: 03/03/2019  Admission Diagnoses: Cervical spondylosis with cervical spinal stenosis    Discharge Diagnoses: Same   Discharged Condition: good  Hospital Course: The patient was admitted on 03/02/2019 and taken to the operating room where the patient underwent ACDF C5-6 C6-7. The patient tolerated the procedure well and was taken to the recovery room and then to the floor in stable condition. The hospital course was routine. There were no complications. The wound remained clean dry and intact. Pt had appropriate neck soreness. No complaints of arm pain or new N/T/W. The patient remained afebrile with stable vital signs, and tolerated a regular diet. The patient continued to increase activities, and pain was well controlled with oral pain medications.   Consults: None  Significant Diagnostic Studies:  Results for orders placed or performed during the hospital encounter of 03/02/19  Glucose, capillary  Result Value Ref Range   Glucose-Capillary 139 (H) 70 - 99 mg/dL  Glucose, capillary  Result Value Ref Range   Glucose-Capillary 148 (H) 70 - 99 mg/dL  Glucose, capillary  Result Value Ref Range   Glucose-Capillary 248 (H) 70 - 99 mg/dL  Glucose, capillary  Result Value Ref Range   Glucose-Capillary 151 (H) 70 - 99 mg/dL   Comment 1 Notify RN    Comment 2 Document in Chart   Glucose, capillary  Result Value Ref Range   Glucose-Capillary 179 (H) 70 - 99 mg/dL   Comment 1 Notify RN    Comment 2 Document in Chart   Glucose, capillary  Result Value Ref Range   Glucose-Capillary 132 (H) 70 - 99 mg/dL   Comment 1 Notify RN    Comment 2 Document in Chart     Chest 2 View  Result Date: 02/27/2019 CLINICAL DATA:  84 year old male with neck pain. EXAM: CHEST - 2 VIEW COMPARISON:  Chest radiograph dated 06/13/2016. FINDINGS: No focal consolidation,  pleural effusion, pneumothorax. The cardiac silhouette is within normal limits. Atherosclerotic calcification of the aorta. No acute osseous pathology. IMPRESSION: No active cardiopulmonary disease. Electronically Signed   By: Anner Crete M.D.   On: 02/27/2019 23:07   DG Cervical Spine 2-3 Views  Result Date: 03/02/2019 CLINICAL DATA:  Anterior cervical fusion. EXAM: CERVICAL SPINE - 2-3 VIEW; DG C-ARM 1-60 MIN FLUOROSCOPY TIME:  18 seconds. COMPARISON:  None. FINDINGS: Two intraoperative fluoroscopic images were obtained of the cervical spine. These demonstrate the patient be status post surgical anterior fusion of C5-6 and C6-7. Good alignment of vertebral bodies is noted. IMPRESSION: Status post surgical anterior fusion of C5-6 and C6-7. Electronically Signed   By: Marijo Conception M.D.   On: 03/02/2019 09:58   DG ERCP  Result Date: 02/23/2019 CLINICAL DATA:  Encounter for a biliary stent removal. History of subtotal cholecystectomy and biliary leak. EXAM: ERCP TECHNIQUE: Multiple spot images obtained with the fluoroscopic device and submitted for interpretation post-procedure. FLUOROSCOPY TIME:  Fluoroscopy Time:  3 minutes, 44 seconds Number of Acquired Spot Images: 18 COMPARISON:  ERCP 01/14/2019 FINDINGS: Initial image demonstrates a biliary stent. Biliary stent was removed. Retrograde cholangiogram was performed. Filling defects in the common bile duct are suggestive for sludge. Filling of the cystic duct and the gallbladder remnant. Evidence for a balloon sweep for removal of the sludge. IMPRESSION: 1. Removal of biliary stent. 2. Retrograde cholangiogram demonstrates biliary sludge that was treated with balloon sweep. 3. Filling of  the cystic duct and gallbladder remnant. No evidence for a biliary leak. These images were submitted for radiologic interpretation only. Please see the procedural report for the amount of contrast and the fluoroscopy time utilized. Electronically Signed   By: Markus Daft M.D.   On: 02/23/2019 13:46   DG C-Arm 1-60 Min  Result Date: 03/02/2019 CLINICAL DATA:  Anterior cervical fusion. EXAM: CERVICAL SPINE - 2-3 VIEW; DG C-ARM 1-60 MIN FLUOROSCOPY TIME:  18 seconds. COMPARISON:  None. FINDINGS: Two intraoperative fluoroscopic images were obtained of the cervical spine. These demonstrate the patient be status post surgical anterior fusion of C5-6 and C6-7. Good alignment of vertebral bodies is noted. IMPRESSION: Status post surgical anterior fusion of C5-6 and C6-7. Electronically Signed   By: Marijo Conception M.D.   On: 03/02/2019 09:58    Antibiotics:  Anti-infectives (From admission, onward)   Start     Dose/Rate Route Frequency Ordered Stop   03/02/19 1400  ceFAZolin (ANCEF) IVPB 2g/100 mL premix     2 g 200 mL/hr over 30 Minutes Intravenous Every 8 hours 03/02/19 1150 03/02/19 2140   03/02/19 0828  bacitracin 50,000 Units in sodium chloride 0.9 % 500 mL irrigation  Status:  Discontinued       As needed 03/02/19 0828 03/02/19 1006   03/02/19 0645  ceFAZolin (ANCEF) IVPB 2g/100 mL premix     2 g 200 mL/hr over 30 Minutes Intravenous On call to O.R. 03/02/19 0636 03/02/19 [id number]      Discharge Exam: Blood pressure 122/70, pulse (!) 56, temperature 98.3 F (36.8 C), temperature source Oral, resp. rate 18, height 5\' 8"  (1.727 m), weight 71.7 kg, SpO2 97 %. Neurologic: Grossly normal Dressing dry  Discharge Medications:   Allergies as of 03/03/2019      Reactions   Iodine Hives   Iohexol Hives         Medication List    TAKE these medications   aspirin 81 MG tablet Take 81 mg by mouth daily.   benazepril 40 MG tablet Commonly known as: LOTENSIN Take 40 mg by mouth daily.   benazepril 20 MG tablet Commonly known as: LOTENSIN Take 1 tablet (20 mg total) by mouth daily.   fish oil-omega-3 fatty acids 1000 MG capsule Take 1 g by mouth daily.   gabapentin 600 MG tablet Commonly known as: NEURONTIN Take 0.5 tablets (300 mg total) by mouth  2 (two) times daily.   HYDROcodone-acetaminophen 5-325 MG tablet Commonly known as: NORCO/VICODIN Take 1 tablet by mouth every 6 (six) hours as needed for moderate pain.   LUBRICATING EYE DROPS OP Place 1 drop into both eyes daily as needed (irritation).   metFORMIN 500 MG 24 hr tablet Commonly known as: GLUCOPHAGE-XR Take 1,000 mg by mouth 2 (two) times daily.   omeprazole 20 MG capsule Commonly known as: PRILOSEC Take 20 mg by mouth daily.   oxyCODONE 5 MG immediate release tablet Commonly known as: Oxy IR/ROXICODONE Take 1 tablet (5 mg total) by mouth every 6 (six) hours as needed for severe pain.   polyethylene glycol 17 g packet Commonly known as: MIRALAX / GLYCOLAX Take 17 g by mouth daily as needed for mild constipation.   saccharomyces boulardii 250 MG capsule Commonly known as: Florastor Take 1 capsule (250 mg total) by mouth 2 (two) times daily. You can find a probiotic to take over the counter.   simvastatin 20 MG tablet Commonly known as: ZOCOR Take 20 mg by mouth at bedtime.  sitaGLIPtin 100 MG tablet Commonly known as: JANUVIA Take 100 mg by mouth daily.   trolamine salicylate 10 % cream Commonly known as: ASPERCREME Apply 1 application topically as needed for muscle pain.       Disposition: Home   Final Dx: ACDF C5-6 C6-7  Discharge Instructions    Call MD for:  difficulty breathing, headache or visual disturbances   Complete by: As directed    Call MD for:  persistant nausea and vomiting   Complete by: As directed    Call MD for:  redness, tenderness, or signs of infection (pain, swelling, redness, odor or green/yellow discharge around incision site)   Complete by: As directed    Call MD for:  severe uncontrolled pain   Complete by: As directed    Call MD for:  temperature >100.4   Complete by: As directed    Diet - low sodium heart healthy   Complete by: As directed    Increase activity slowly   Complete by: As directed    Remove  dressing in 48 hours   Complete by: As directed       Follow-up Information    Eustace Moore, MD. Schedule an appointment as soon as possible for a visit in 2 week(s).   Specialty: Neurosurgery Contact information: [address] [place] [address] [place] [address] [phone]            Signed: Eustace Moore 03/03/2019, 7:53 AM

## 2019-03-03 NOTE — Evaluation (Signed)
Occupational Therapy Evaluation Patient Details Name: Mark Bowen MRN: [id number] DOB: Nov 27, 1935 Today's Date: 03/03/2019    History of Present Illness Pt is an 84 y/o male s/p ACDF C5-6 C6-7. PMHx includes DM, HTN   Clinical Impression   OT evalutation completed with no further acute OT needs identified. Pt admitted for ACDF, reports independent PTA and living with spouse. Pt completing room/hallway level mobility without AD, LB and UB ADL at supervision-mod independent level throughout. Pt requiring min cues for carryover of cervical precuations, but with improvements noted with education provided. Educated pt re: cervical precautions, safety, and compensatory techniques for completing ADL and functional transfers given precautions. Pt reports plans to return home with spouse and niece, reports niece to assist with ADL/iADL PRN initially. Questions answered throughout. Pt anticipating d/c home today; do not anticipate pt will require follow up OT services after discharge, Acute OT to sign off. Thank you for this referral.     Follow Up Recommendations  No OT follow up;Supervision - Intermittent    Equipment Recommendations  None recommended by OT           Precautions / Restrictions Precautions Precautions: Fall;Cervical Precaution Booklet Issued: Yes (comment) Precaution Comments: issued and reviewed, cued during functional tasks Restrictions Weight Bearing Restrictions: No      Mobility Bed Mobility               General bed mobility comments: recevied standing in room, verbally reviewed log roll technique  Transfers Overall transfer level: Modified independent Equipment used: None                  Balance Overall balance assessment: No apparent balance deficits (not formally assessed)                                         ADL either performed or assessed with clinical judgement   ADL Overall ADL's : Needs  assistance/impaired Eating/Feeding: Modified independent;Sitting   Grooming: Supervision/safety;Standing   Upper Body Bathing: Modified independent;Sitting   Lower Body Bathing: Supervison/ safety;Sit to/from stand   Upper Body Dressing : Modified independent;Sitting   Lower Body Dressing: Supervision/safety;Sit to/from stand   Toilet Transfer: Supervision/safety;Ambulation Toilet Transfer Details (indicate cue type and reason): simulated via transfer to/from EOB Toileting- Clothing Manipulation and Hygiene: Supervision/safety;Sit to/from Nurse, children's Details (indicate cue type and reason): discussed pt using shower seat to increase safety during bathing task as well as increase ease of adherence to cervical precautions  Functional mobility during ADLs: Supervision/safety General ADL Comments: pt requiring min cues to adhere to precautions during functional tasks                         Pertinent Vitals/Pain Pain Assessment: Faces Faces Pain Scale: Hurts a little bit Pain Location: incisional Pain Descriptors / Indicators: Discomfort Pain Intervention(s): Limited activity within patient's tolerance;Monitored during session;Repositioned     Hand Dominance     Extremity/Trunk Assessment Upper Extremity Assessment Upper Extremity Assessment: Overall WFL for tasks assessed   Lower Extremity Assessment Lower Extremity Assessment: Overall WFL for tasks assessed   Cervical / Trunk Assessment Cervical / Trunk Assessment: Other exceptions Cervical / Trunk Exceptions: s/p ACDF   Communication Communication Communication: HOH   Cognition Arousal/Alertness: Awake/alert Behavior During Therapy: WFL for tasks assessed/performed Overall Cognitive Status: Within Functional Limits for  tasks assessed                                     General Comments       Exercises     Shoulder Instructions      Home Living Family/patient expects  to be discharged to:: Private residence Living Arrangements: Spouse/significant other;Other (Comment)(neice) Available Help at Discharge: Family;Available 24 hours/day Type of Home: House Home Access: Stairs to enter [employer] of Steps: 2   Home Layout: One level     Bathroom Shower/Tub: Teacher, early years/pre: Standard     Home Equipment: Shower seat          Prior Functioning/Environment Level of Independence: Independent                 OT Problem List: Decreased range of motion;Decreased knowledge of precautions;Decreased knowledge of use of DME or AE;Pain      OT Treatment/Interventions:      OT Goals(Current goals can be found in the care plan section) Acute Rehab OT Goals Patient Stated Goal: home today, get back to lawn mowing when summer arrives OT Goal Formulation: All assessment and education complete, DC therapy  OT Frequency:     Barriers to D/C:            Co-evaluation              AM-PAC OT "6 Clicks" Daily Activity     Outcome Measure Help from another person eating meals?: None Help from another person taking care of personal grooming?: None Help from another person toileting, which includes using toliet, bedpan, or urinal?: None Help from another person bathing (including washing, rinsing, drying)?: None Help from another person to put on and taking off regular upper body clothing?: None Help from another person to put on and taking off regular lower body clothing?: None 6 Click Score: 24   End of Session    Activity Tolerance: Patient tolerated treatment well Patient left: with call bell/phone within reach;Other (comment)(seated EOB )  OT Visit Diagnosis: Other abnormalities of gait and mobility (R26.89)                Time: [id number] OT Time Calculation (min): 22 min Charges:  OT General Charges $OT Visit: 1 Visit OT Evaluation $OT Eval Low Complexity: [place], OT Supplemental  Rehabilitation Services Pager [phone] Office [phone]   Raymondo Band 03/03/2019, 11:11 AM

## 2019-03-08 ENCOUNTER — Ambulatory Visit: Payer: Medicare Other | Attending: Internal Medicine

## 2019-03-08 DIAGNOSIS — Z23 Encounter for immunization: Secondary | ICD-10-CM

## 2019-03-08 NOTE — Progress Notes (Signed)
   Covid-19 Vaccination Clinic  Name:  Mark Bowen    MRN: [id number] DOB: [birth year]  03/08/2019  [name] was observed post Covid-19 immunization for 15 minutes without incidence. He was provided with Vaccine Information Sheet and instruction to access the V-Safe system.   [name] was instructed to call 911 with any severe reactions post vaccine: Marland Kitchen Difficulty breathing  . Swelling of your face and throat  . A fast heartbeat  . A bad rash all over your body  . Dizziness and weakness    Immunizations Administered    Name Date Dose VIS Date Route   Pfizer COVID-19 Vaccine 03/08/2019  4:31 PM 0.3 mL 12/19/2018 Intramuscular   Manufacturer: [place]   Lot: [id number]   [place]: [id number]

## 2019-03-25 ENCOUNTER — Emergency Department: Payer: Medicare Other

## 2019-03-25 ENCOUNTER — Other Ambulatory Visit: Payer: Self-pay

## 2019-03-25 ENCOUNTER — Emergency Department
Admission: EM | Admit: 2019-03-25 | Discharge: 2019-03-25 | Discharge status: Against Medical Advice | Payer: Medicare Other | Attending: Emergency Medicine | Admitting: Emergency Medicine

## 2019-03-25 DIAGNOSIS — Z79899 Other long term (current) drug therapy: Secondary | ICD-10-CM | POA: Diagnosis not present

## 2019-03-25 DIAGNOSIS — I10 Essential (primary) hypertension: Secondary | ICD-10-CM | POA: Diagnosis not present

## 2019-03-25 DIAGNOSIS — Z8546 Personal history of malignant neoplasm of prostate: Secondary | ICD-10-CM | POA: Diagnosis not present

## 2019-03-25 DIAGNOSIS — Z7982 Long term (current) use of aspirin: Secondary | ICD-10-CM | POA: Diagnosis not present

## 2019-03-25 DIAGNOSIS — Z5329 Procedure and treatment not carried out because of patient's decision for other reasons: Secondary | ICD-10-CM | POA: Diagnosis not present

## 2019-03-25 DIAGNOSIS — E119 Type 2 diabetes mellitus without complications: Secondary | ICD-10-CM | POA: Diagnosis not present

## 2019-03-25 DIAGNOSIS — Z7984 Long term (current) use of oral hypoglycemic drugs: Secondary | ICD-10-CM | POA: Insufficient documentation

## 2019-03-25 DIAGNOSIS — G454 Transient global amnesia: Secondary | ICD-10-CM | POA: Diagnosis not present

## 2019-03-25 DIAGNOSIS — R4182 Altered mental status, unspecified: Secondary | ICD-10-CM | POA: Diagnosis present

## 2019-03-25 DIAGNOSIS — Z87891 Personal history of nicotine dependence: Secondary | ICD-10-CM | POA: Diagnosis not present

## 2019-03-25 LAB — COMPREHENSIVE METABOLIC PANEL
ALT: 17 U/L (ref 0–44)
AST: 18 U/L (ref 15–41)
Albumin: 4.4 g/dL (ref 3.5–5.0)
Alkaline Phosphatase: 137 U/L — ABNORMAL HIGH (ref 38–126)
Anion gap: 10 (ref 5–15)
BUN: 14 mg/dL (ref 8–23)
CO2: 28 mmol/L (ref 22–32)
Calcium: 9.8 mg/dL (ref 8.9–10.3)
Chloride: 102 mmol/L (ref 98–111)
Creatinine, Ser: 0.89 mg/dL (ref 0.61–1.24)
GFR calc Af Amer: >60 mL/min (ref >60)
GFR calc non Af Amer: >60 mL/min (ref >60)
Glucose, Bld: 120 mg/dL — ABNORMAL HIGH (ref 70–99)
Potassium: 4.5 mmol/L (ref 3.5–5.1)
Sodium: 140 mmol/L (ref 135–145)
Total Bilirubin: 0.7 mg/dL (ref 0.3–1.2)
Total Protein: 7.3 g/dL (ref 6.5–8.1)

## 2019-03-25 LAB — CBC
HCT: 44.5 % (ref 39.0–52.0)
Hemoglobin: 14.7 g/dL (ref 13.0–17.0)
MCH: 28.6 pg (ref 26.0–34.0)
MCHC: 33 g/dL (ref 30.0–36.0)
MCV: 86.6 fL (ref 80.0–100.0)
Platelets: 217 10*3/uL (ref 150–400)
RBC: 5.14 MIL/uL (ref 4.22–5.81)
RDW: 13.4 % (ref 11.5–15.5)
WBC: 9 10*3/uL (ref 4.0–10.5)
nRBC: 0 % (ref 0.0–0.2)

## 2019-03-25 LAB — DIFFERENTIAL
Abs Immature Granulocytes: 0.04 10*3/uL (ref 0.00–0.07)
Basophils Absolute: 0.1 10*3/uL (ref 0.0–0.1)
Basophils Relative: 1 %
Eosinophils Absolute: 0.2 10*3/uL (ref 0.0–0.5)
Eosinophils Relative: 2 %
Immature Granulocytes: 0 %
Lymphocytes Relative: 22 %
Lymphs Abs: 2 10*3/uL (ref 0.7–4.0)
Monocytes Absolute: 0.6 10*3/uL (ref 0.1–1.0)
Monocytes Relative: 7 %
Neutro Abs: 6.2 10*3/uL (ref 1.7–7.7)
Neutrophils Relative %: 68 %

## 2019-03-25 LAB — PROTIME-INR
INR: 0.9 (ref 0.8–1.2)
Prothrombin Time: 12.4 seconds (ref 11.4–15.2)

## 2019-03-25 LAB — APTT: aPTT: 36 seconds (ref 24–36)

## 2019-03-25 NOTE — Discharge Instructions (Addendum)
We recommended that you have a CT scan of your head, possibly an MRI, and blood work to make sure that you are not having a stroke, seizure, or other condition which could cause permanent disability or death.  Please call your primary care doctor tomorrow morning to arrange for follow-up as soon as possible.  You may return to the ER at any time if you change your mind and wish to resume your work-up.  You should return immediately if you have any recurrent symptoms such as memory loss, or any new symptoms such as vision changes, difficulty speaking, severe headache, weakness or numbness, or any other new or worsening symptoms that concern you.

## 2019-03-25 NOTE — ED Provider Notes (Signed)
[facility] Emergency Department Provider Note ____________________________________________   First MD Initiated Contact with Patient 03/25/19 1817     (approximate)  I have reviewed the triage vital signs and the nursing notes.   HISTORY  Chief Complaint Altered Mental Status    HPI Mark Bowen is a 84 y.o. male with PMH as noted below who presents with an episode of memory loss.  The patient states that he was driving with a coworker, made a wrong turn, and then suddenly realize he did not know where he was.  He states that this lasted a few minutes and has now resolved.  He reports one episode many years ago which was similar in which he was eating dinner with some people and suddenly could not recognize anyone around him.  He was told that this was a type of mini stroke.  Patient denies any vision changes, shaking or convulsions, difficulty speaking, weakness or numbness, or other acute symptoms.  He states he feels back to his baseline.   Past Medical History:  Diagnosis Date  . Acute cholecystitis 01/2019  . Arthritis   . Diabetes mellitus 2007   type 2  . Diverticulitis   . GERD (gastroesophageal reflux disease)    02/27/2019- not currently  . Hypercholesterolemia since 2008 preventative   taking Zocor, now cholesterol WNL  . Hypertension since 1970  . Prostate CA ([place])   . Shingles     Patient Active Problem List   Diagnosis Date Noted  . S/P cervical spinal fusion 03/02/2019  . Encounter for removal of biliary stent   . Choledocholithiasis   . Bile leak   . Acute cholecystitis 01/12/2019  . HLD (hyperlipidemia) 06/10/2016  . Sepsis ([place]) 06/10/2016  . Diabetes mellitus without complication ([place]) [id number]  . Fever   . DIVERTICULITIS-COLON 01/08/2008  . CHOLELITHIASIS 01/08/2008  . PERSONAL HX COLONIC POLYPS 01/08/2008  . DIABETES MELLITUS, TYPE I, CONTROLLED 12/31/2007  . Essential hypertension 12/31/2007  . GERD 12/31/2007  .  ARTHRITIS 12/31/2007  . HEMORRHOIDS, INTERNAL 05/02/2006  . DIVERTICULOSIS, COLON 05/02/2006    Past Surgical History:  Procedure Laterality Date  . ANTERIOR CERVICAL DECOMP/DISCECTOMY FUSION N/A 03/02/2019   Procedure: ANTERIOR CERVICAL DECOMPRESSION FUSION - CERVICAL FIVE-CERVICAL SIX - CERVICAL SIX-CERVICAL SEVEN;  Surgeon: Eustace Moore, MD;  Location: [place];  Service: Neurosurgery;  Laterality: N/A;  . APPENDECTOMY  1960  . [place] & 2009   2009 - discectomy  . BILIARY STENT PLACEMENT  01/14/2019   Procedure: BILIARY STENT PLACEMENT;  Surgeon: Irene Shipper, MD;  Location: [place];  Service: Endoscopy;;  . CHOLECYSTECTOMY N/A 01/13/2019   Procedure: LAPAROSCOPIC SUBTOTAL CHOLECYSTECTOMY, WITH PLACEMENT OF DRAIN;  Surgeon: Ileana Roup, MD;  Location: [place];  Service: General;  Laterality: N/A;  . COLONOSCOPY    . ERCP N/A 01/14/2019   Procedure: ENDOSCOPIC RETROGRADE CHOLANGIOPANCREATOGRAPHY (ERCP);  Surgeon: Irene Shipper, MD;  Location: [facility] ENDOSCOPY;  Service: Endoscopy;  Laterality: N/A;  . ERCP N/A 02/23/2019   Procedure: ENDOSCOPIC RETROGRADE CHOLANGIOPANCREATOGRAPHY (ERCP);  Surgeon: Irene Shipper, MD;  Location: [facility];  Service: Endoscopy;  Laterality: N/A;  . INGUINAL HERNIA REPAIR Bilateral 1970   bilateral  . KNEE SURGERY Right 11/2018  . PROSTATE SURGERY    . SPHINCTEROTOMY  01/14/2019   Procedure: SPHINCTEROTOMY;  Surgeon: Irene Shipper, MD;  Location: [facility] ENDOSCOPY;  Service: Endoscopy;;  . STENT REMOVAL  02/23/2019   Procedure: STENT REMOVAL;  Surgeon:  Irene Shipper, MD;  Location: [facility] ENDOS[facility];  Service: Endoscopy;;  . TONSILLECTOMY  1960    Prior to Admission medications   Medication Sig Start Date End Date Taking? Authorizing Provider  aspirin 81 MG tablet Take 81 mg by mouth daily.    [provider]  benazepril (LOTENSIN) 20 MG tablet Take 1 tablet (20 mg total) by mouth daily. Patient not taking: Reported on 02/23/2019 06/20/16    Reyne Dumas, MD  benazepril (LOTENSIN) 40 MG tablet Take 40 mg by mouth daily.    [provider]  Carboxymethylcellul-Glycerin (LUBRICATING EYE DROPS OP) Place 1 drop into both eyes daily as needed (irritation).    [provider]  fish oil-omega-3 fatty acids 1000 MG capsule Take 1 g by mouth daily.     [provider]  gabapentin (NEURONTIN) 600 MG tablet Take 0.5 tablets (300 mg total) by mouth 2 (two) times daily. Patient not taking: Reported on 02/23/2019 06/15/16 02/23/19  Reyne Dumas, MD  HYDROcodone-acetaminophen (NORCO/VICODIN) 5-325 MG tablet Take 1 tablet by mouth every 6 (six) hours as needed for moderate pain. 03/03/19   Eustace Moore, MD  metFORMIN (GLUCOPHAGE-XR) 500 MG 24 hr tablet Take 1,000 mg by mouth 2 (two) times daily.    [provider]  omeprazole (PRILOSEC) 20 MG capsule Take 20 mg by mouth daily.    [provider]  oxyCODONE (OXY IR/ROXICODONE) 5 MG immediate release tablet Take 1 tablet (5 mg total) by mouth every 6 (six) hours as needed for severe pain. Patient not taking: Reported on 02/18/2019 01/16/19   Margie Billet A, PA-C  polyethylene glycol (MIRALAX / GLYCOLAX) 17 g packet Take 17 g by mouth daily as needed for mild constipation. Patient not taking: Reported on 02/18/2019 01/16/19   Wellington Hampshire, PA-C  saccharomyces boulardii (FLORASTOR) 250 MG capsule Take 1 capsule (250 mg total) by mouth 2 (two) times daily. You can find a probiotic to take over the counter. Patient not taking: Reported on 02/18/2019 01/16/19   Margie Billet A, PA-C  simvastatin (ZOCOR) 20 MG tablet Take 20 mg by mouth at bedtime.    [provider]  sitaGLIPtin (JANUVIA) 100 MG tablet Take 100 mg by mouth daily.    [provider]  trolamine salicylate (ASPERCREME) 10 % cream Apply 1 application topically as needed for muscle pain.    [provider]    Allergies Iodine and Iohexol  Family History  Problem Relation Age  of Onset  . Lung cancer Father   . Prostate cancer Brother        x 3 brothers  . Colon cancer Neg Hx   . Stomach cancer Neg Hx     Social History Social History   Tobacco Use  . Smoking status: Former Smoker    Quit date: 04/04/1959    Years since quitting: 60.0  . Smokeless tobacco: Never Used  Substance Use Topics  . Alcohol use: No    Comment: non achololic  . Drug use: No    Review of Systems  Constitutional: No fever/chills Eyes: No visual changes. ENT: No sore throat. Cardiovascular: Denies chest pain. Respiratory: Denies shortness of breath. Gastrointestinal: No nausea, no vomiting.  No diarrhea.  Genitourinary: Negative for dysuria.  Musculoskeletal: Negative for back pain. Skin: Negative for rash. Neurological: Negative for headaches, focal weakness or numbness.   ____________________________________________   PHYSICAL EXAM:  VITAL SIGNS: ED Triage Vitals  Enc Vitals Group     BP 03/25/19  1556 (!) 163/74     Pulse Rate 03/25/19 1556 70     Resp 03/25/19 1556 18     Temp 03/25/19 1556 97.6 F (36.4 C)     Temp Source 03/25/19 1556 Oral     SpO2 03/25/19 1556 100 %     Weight 03/25/19 1548 150 lb (68 kg)     Height 03/25/19 1548 5\' 9"  (1.753 m)     Head Circumference --      Peak Flow --      Pain Score 03/25/19 1547 0     Pain Loc --      Pain Edu? --      Excl. in [place]? --     Constitutional: Alert and oriented. Well appearing and in no acute distress. Eyes: Conjunctivae are normal.  EOMI.  PERRLA. Head: Atraumatic. Nose: No congestion/rhinnorhea. Mouth/Throat: Mucous membranes are moist.   Neck: Normal range of motion.  Cardiovascular: Normal rate, regular rhythm.  Good peripheral circulation. Respiratory: Normal respiratory effort.  No retractions.  Gastrointestinal: No distention.  Musculoskeletal: No lower extremity edema.  Extremities warm and well perfused.  Neurologic:  Normal speech and language.  Motor and sensory intact in all  extremities.  No facial droop.  5/5 motor strength and intact sensation to all extremities.  No pronator drift.  Normal coordination with no ataxia. Skin:  Skin is warm and dry. No rash noted. Psychiatric: Mood and affect are normal. Speech and behavior are normal.  ____________________________________________   LABS (all labs ordered are listed, but only abnormal results are displayed)  Labs Reviewed  COMPREHENSIVE METABOLIC PANEL - Abnormal; Notable for the following components:      Result Value   Glucose, Bld 120 (*)    Alkaline Phosphatase 137 (*)    All other components within normal limits  PROTIME-INR  APTT  CBC  DIFFERENTIAL  CBG MONITORING, ED   ____________________________________________  EKG  ED ECG REPORT I, Arta Silence, the attending physician, personally viewed and interpreted this ECG.  Date: 03/25/2019 EKG Time: 1550 Rate: 78 Rhythm: normal sinus rhythm QRS Axis: normal Intervals: normal ST/T Wave abnormalities: normal Narrative Interpretation: no evidence of acute ischemia  ____________________________________________  RADIOLOGY  CT head: No acute abnormality  ____________________________________________   PROCEDURES  Procedure(s) performed: No  Procedures  Critical Care performed: No ____________________________________________   INITIAL IMPRESSION / ASSESSMENT AND PLAN / ED COURSE  Pertinent labs & imaging results that were available during my care of the patient were reviewed by me and considered in my medical decision making (see chart for details).  84 year old male with PMH as noted above presents with an episode of memory loss which lasted for several minutes and has now resolved.  He reports a similar episode once many years ago.  He denies any other acute neurologic symptoms and has otherwise been feeling well.  He feels that he is now back to his baseline.  On exam, the patient is overall well-appearing.  His vital  signs are normal.  Physical exam is unremarkable.  Neurologic exam is nonfocal.  Overall presentation is most consistent with transient global ischemia, although temporal lobe or other type of seizure is also on the differential.  The patient had lab work-up and CT head obtained from triage which are within normal limits.  I recommended that we observe the patient at least for several hours if not overnight and consider MRI or additional imaging, however, the patient states that he would prefer to go home and  follow-up with his PMD.  The patient demonstrates appropriate decision-making capacity.  I explained to him that without further work-up I cannot rule out a stroke, seizure, or other condition that could cause permanent disability or death.  He was able to paraphrase these risks back to me and demonstrated good understanding.  Based on this discussion, I will discharge him [place].  I gave him thorough return precautions and also emphasized he may return at any time if he wishes to change his mind and resume his work-up.  I instructed him to call his PMD tomorrow for follow-up.   ____________________________________________   FINAL CLINICAL IMPRESSION(S) / ED DIAGNOSES  Final diagnoses:  Transient global amnesia      NEW MEDICATIONS STARTED DURING THIS VISIT:  Discharge Medication List as of 03/25/2019  6:27 PM       Note:  This document was prepared using Dragon voice recognition software and may include unintentional dictation errors.    Arta Silence, MD 03/25/19 1905

## 2019-03-25 NOTE — ED Triage Notes (Signed)
Pt comes POV with a work friend after friend states he had a sudden loss of memory at work. Pt didn't know where he was or what was going on. Pt is oriented now but it takes him a long time to find the answer. Pt does not remember having the confusion. LKW right before getting here per pt.

## 2019-04-02 DIAGNOSIS — R413 Other amnesia: Secondary | ICD-10-CM | POA: Insufficient documentation

## 2019-04-03 ENCOUNTER — Other Ambulatory Visit: Payer: Self-pay | Admitting: Internal Medicine

## 2019-04-03 DIAGNOSIS — R413 Other amnesia: Secondary | ICD-10-CM

## 2019-04-07 ENCOUNTER — Ambulatory Visit: Payer: Medicare Other | Attending: Internal Medicine

## 2019-04-07 DIAGNOSIS — Z23 Encounter for immunization: Secondary | ICD-10-CM

## 2019-04-07 NOTE — Progress Notes (Signed)
   Covid-19 Vaccination Clinic  Name:  Mark Bowen    MRN: [id number] DOB: 1935-10-26  04/07/2019  [name] was observed post Covid-19 immunization for 15 minutes without incident. He was provided with Vaccine Information Sheet and instruction to access the V-Safe system.   [name] was instructed to call 911 with any severe reactions post vaccine: Marland Kitchen Difficulty breathing  . Swelling of face and throat  . A fast heartbeat  . A bad rash all over body  . Dizziness and weakness   Immunizations Administered    Name Date Dose VIS Date Route   Pfizer COVID-19 Vaccine 04/07/2019 12:12 PM 0.3 mL 12/19/2018 Intramuscular   Manufacturer: [place]   Lot: [id number]   [place]: [id number]

## 2019-04-14 ENCOUNTER — Ambulatory Visit (INDEPENDENT_AMBULATORY_CARE_PROVIDER_SITE_OTHER): Payer: Medicare Other | Admitting: Neurology

## 2019-04-14 ENCOUNTER — Telehealth: Payer: Self-pay | Admitting: Neurology

## 2019-04-14 ENCOUNTER — Encounter: Payer: Self-pay | Admitting: Neurology

## 2019-04-14 ENCOUNTER — Other Ambulatory Visit: Payer: Self-pay

## 2019-04-14 VITALS — BP 139/71 | HR 63 | Temp 97.8°F | Ht 69.0 in | Wt 150.0 lb

## 2019-04-14 DIAGNOSIS — R413 Other amnesia: Secondary | ICD-10-CM | POA: Diagnosis not present

## 2019-04-14 DIAGNOSIS — G454 Transient global amnesia: Secondary | ICD-10-CM

## 2019-04-14 DIAGNOSIS — G459 Transient cerebral ischemic attack, unspecified: Secondary | ICD-10-CM | POA: Diagnosis not present

## 2019-04-14 DIAGNOSIS — G40209 Localization-related (focal) (partial) symptomatic epilepsy and epileptic syndromes with complex partial seizures, not intractable, without status epilepticus: Secondary | ICD-10-CM

## 2019-04-14 NOTE — Progress Notes (Signed)
[facility] [address] [place]. [place] [address] [phone]       OFFICE CONSULT NOTE  Mr. KELDAN BOVAIRD Date of Birth:  09-13-1935 Medical Record Number:  [id number]   Referring MD: Hardie Shackleton  Reason for Referral: Memory loss  HPI: Mr. Similien is a pleasant 84 year old Caucasian male seen today for initial office consultation visit for episode of memory loss.  History is obtained from patient, review of referral and ER notes as well as electronic medical records and imaging films in PACS.  He has past medical history of hypertension diabetes and recently underwent gallbladder surgery in January 21 and shoulder surgery a few weeks later.  On 03/25/2019 he had an episode of transient memory loss.  He states he was driving around with his friend and made a wrong turn and suddenly realized that he was disoriented and did not know where he was.  He also could not remember what he had done earlier in the day with his friend.  The patient does not feel that he will kept on asking the same questions over and over again and had any trouble making new memories this lasted about 5 minutes and then he gradually started remembering everything.  He states this was quite similar to a similar episode he had 12 years ago while in [place].  He had been eating dinner with some people and could not remember anybody around him for a few minutes.  At that time he was told that it was a mini stroke.  He is unable to give me any details about hospital work-up or testing done.  Following the more recent episode he was seen in the ER where complete metabolic panel labs were unremarkable except for slightly elevated alkaline phosphatase and CBC was normal.  CT scan of the head was obtained which showed no acute abnormalities.  Patient states is done well since then has had no further episodes.  He does take an aspirin a day but denies any other symptoms suggestive of stroke or TIAs.  He has no prior  history of seizures, loss of consciousness, significant head injury.  There is no family history of epilepsy, seizures strokes or TIAs  ROS:   14 system review of systems is positive for memory loss, confusion, disorientation and recent gallbladder and neck surgeries all other systems negative  PMH:  Past Medical History:  Diagnosis Date  . Acute cholecystitis 01/2019  . Arthritis   . Diabetes mellitus 2007   type 2  . Diverticulitis   . GERD (gastroesophageal reflux disease)    02/27/2019- not currently  . Hypercholesterolemia since 2008 preventative   taking Zocor, now cholesterol WNL  . Hypertension since 1970  . Prostate CA ([place])   . Shingles     Social History:  Social History   Socioeconomic History  . Marital status: Married    Spouse name: Not on file  . Number of children: Not on file  . Years of education: Not on file  . Highest education level: Not on file  Occupational History  . Not on file  Tobacco Use  . Smoking status: Former Smoker    Quit date: 04/04/1959    Years since quitting: 60.0  . Smokeless tobacco: Never Used  Substance and Sexual Activity  . Alcohol use: No    Comment: non achololic  . Drug use: No  . Sexual activity: Not on file  Other Topics Concern  . Not on file  Social History  Narrative   Lives at home with his wife - wife suffers from dementia and has hx of falls   Social Determinants of Radio broadcast assistant Strain:   . Difficulty of Paying Living Expenses:   Food Insecurity:   . Worried About Charity fundraiser in the Last Year:   . Arboriculturist in the Last Year:   Transportation Needs:   . Film/video editor (Medical):   Marland Kitchen Lack of Transportation (Non-Medical):   Physical Activity:   . Days of Exercise per Week:   . Minutes of Exercise per Session:   Stress:   . Feeling of Stress :   Social Connections:   . Frequency of Communication with Friends and Family:   . Frequency of Social Gatherings with Friends  and Family:   . Attends Religious Services:   . Active Member of Clubs or Organizations:   . Attends Archivist Meetings:   Marland Kitchen Marital Status:   Intimate Partner Violence:   . Fear of Current or Ex-Partner:   . Emotionally Abused:   Marland Kitchen Physically Abused:   . Sexually Abused:     Medications:   Current Outpatient Medications on File Prior to Visit  Medication Sig Dispense Refill  . aspirin 81 MG tablet Take 81 mg by mouth daily.    . benazepril (LOTENSIN) 20 MG tablet Take 1 tablet (20 mg total) by mouth daily. 30 tablet 1  . benazepril (LOTENSIN) 40 MG tablet Take 40 mg by mouth daily.    . Carboxymethylcellul-Glycerin (LUBRICATING EYE DROPS OP) Place 1 drop into both eyes daily as needed (irritation).    . fish oil-omega-3 fatty acids 1000 MG capsule Take 1 g by mouth daily.     Marland Kitchen gabapentin (NEURONTIN) 600 MG tablet Take 0.5 tablets (300 mg total) by mouth 2 (two) times daily. 30 tablet 0  . metFORMIN (GLUCOPHAGE-XR) 500 MG 24 hr tablet Take 1,000 mg by mouth 2 (two) times daily.    Marland Kitchen omeprazole (PRILOSEC) 20 MG capsule Take 20 mg by mouth daily.    . polyethylene glycol (MIRALAX / GLYCOLAX) 17 g packet Take 17 g by mouth daily as needed for mild constipation. 14 each 0  . saccharomyces boulardii (FLORASTOR) 250 MG capsule Take 1 capsule (250 mg total) by mouth 2 (two) times daily. You can find a probiotic to take over the counter.    . simvastatin (ZOCOR) 20 MG tablet Take 20 mg by mouth at bedtime.    . sitaGLIPtin (JANUVIA) 100 MG tablet Take 100 mg by mouth daily.    Marland Kitchen trolamine salicylate (ASPERCREME) 10 % cream Apply 1 application topically as needed for muscle pain.     No current facility-administered medications on file prior to visit.    Allergies:   Allergies  Allergen Reactions  . Iodine Hives  . Iohexol Hives         Physical Exam General: Frail elderly Caucasian male, seated, in no evident distress Head: head normocephalic and atraumatic.   Neck:  supple with no carotid or supraclavicular bruits Cardiovascular: regular rate and rhythm, no murmurs Musculoskeletal: no deformity Skin:  no rash/petichiae Vascular:  Normal pulses all extremities  Neurologic Exam Mental Status: Awake and fully alert. Oriented to place and time. Recent and remote memory intact. Attention span, concentration and fund of knowledge appropriate. Mood and affect appropriate.  Intact recall 3/3.  Able to name 11 animals which can walk on 4 legs Cranial Nerves: Fundoscopic exam  reveals sharp disc margins. Pupils equal, briskly reactive to light. Extraocular movements full without nystagmus. Visual fields full to confrontation. Hearing diminished bilaterally despite hearing aid. Facial sensation intact. Face, tongue, palate moves normally and symmetrically.  Motor: Normal bulk and tone. Normal strength in all tested extremity muscles. Sensory.: intact to touch , pinprick , position and vibratory sensation.  Coordination: Rapid alternating movements normal in all extremities. Finger-to-nose and heel-to-shin performed accurately bilaterally. Gait and Station: Arises from chair without difficulty. Stance is normal. Gait demonstrates normal stride length and balance . Able to heel, toe and tandem walk with slight difficulty.  Reflexes: 1+ and symmetric. Toes downgoing.   NIHSS  0 Modified Rankin 0   ASSESSMENT: 84 year old Caucasian male with transient episode of memory loss and confusion of unclear etiology.  Possibilities include transient global amnesia versus complex partial partial seizure.  Vertebrobasilar TIA is less likely given the recurrent and severe typical nature of the episode.     PLAN: I had a long discussion with the patient regarding his recurrent episode of transient memory loss confusion and disorientation being of unclear etiology possibilities include transient global amnesia versus complex partial seizure.  Vertebrobasilar TIA is less likely given  recurrent and stereotypical nature of these 2 episodes prior to 12 years apart.  I recommend further evaluation checking EEG as well as MRI scan of the brain with and without contrast and MRA of the brain and neck.  Check lipid profile and hemoglobin A1c.  Continue aspirin for stroke prevention.  Greater than 50% time during this 50-minute consultation visit was spent on counseling and coordination of care about his episode of memory loss and disorientation discussion about differential diagnosis and evaluation and treatment plan and answering questions he will return for follow-up in the future in 2 months or call earlier if necessary. Antony Contras, MD  [facility] [address] [place], [place] [address]  Phone [phone] Fax [phone] Note: This document was prepared with digital dictation and possible smart phrase technology. Any transcriptional errors that result from this process are unintentional.

## 2019-04-14 NOTE — Patient Instructions (Signed)
I had a long discussion with the patient regarding his recurrent episode of transient memory loss confusion and disorientation being of unclear etiology possibilities include transient global amnesia versus complex partial seizure.  Vertebrobasilar TIA is less likely given recurrent and stereotypical nature of these 2 episodes prior to 12 years apart.  I recommend further evaluation checking EEG as well as MRI scan of the brain with and without contrast and MRA of the brain and neck.  Check lipid profile and hemoglobin A1c.  Continue aspirin for stroke prevention.  Will return for follow-up in the future in 2 months or call earlier if necessary.

## 2019-04-14 NOTE — Telephone Encounter (Signed)
[employer] medicare order sent to [facility]. No auth they will reach out to the patient to schedule.  

## 2019-04-15 LAB — HEMOGLOBIN A1C
Est. average glucose Bld gHb Est-mCnc: 140 mg/dL
Hgb A1c MFr Bld: 6.5 % — ABNORMAL HIGH (ref 4.8–5.6)

## 2019-04-15 LAB — LIPID PANEL
Chol/HDL Ratio: 2.3 ratio (ref 0.0–5.0)
Cholesterol, Total: 101 mg/dL (ref 100–199)
HDL: 43 mg/dL (ref >39)
LDL Chol Calc (NIH): 42 mg/dL (ref 0–99)
Triglycerides: 78 mg/dL (ref 0–149)
VLDL Cholesterol Cal: 16 mg/dL (ref 5–40)

## 2019-04-17 ENCOUNTER — Ambulatory Visit (HOSPITAL_COMMUNITY)
Admission: RE | Admit: 2019-04-17 | Discharge: 2019-04-17 | Disposition: A | Discharge status: Home / Self Care | Payer: Medicare Other | Source: Ambulatory Visit | Attending: Internal Medicine | Admitting: Internal Medicine

## 2019-04-17 ENCOUNTER — Other Ambulatory Visit: Payer: Self-pay

## 2019-04-17 DIAGNOSIS — R413 Other amnesia: Secondary | ICD-10-CM | POA: Diagnosis present

## 2019-04-28 ENCOUNTER — Telehealth: Payer: Self-pay

## 2019-04-28 NOTE — Telephone Encounter (Signed)
Need results from MRI brain results. Results left for Dt.Sethi to review.

## 2019-04-28 NOTE — Telephone Encounter (Signed)
I called the patient's listed number to give results of MRI scan but was unable to do so as voicemail box was full and could not leave a message.   Kindly inform the patient that MRI scan shows mild age-related shrinkage of the brain.  No worrisome finding.

## 2019-05-06 ENCOUNTER — Other Ambulatory Visit: Payer: Medicare Other

## 2019-06-03 ENCOUNTER — Ambulatory Visit (INDEPENDENT_AMBULATORY_CARE_PROVIDER_SITE_OTHER): Payer: Medicare Other | Admitting: Neurology

## 2019-06-03 ENCOUNTER — Other Ambulatory Visit: Payer: Self-pay

## 2019-06-03 DIAGNOSIS — G40209 Localization-related (focal) (partial) symptomatic epilepsy and epileptic syndromes with complex partial seizures, not intractable, without status epilepticus: Secondary | ICD-10-CM

## 2019-06-03 DIAGNOSIS — G454 Transient global amnesia: Secondary | ICD-10-CM

## 2019-06-03 DIAGNOSIS — R413 Other amnesia: Secondary | ICD-10-CM

## 2019-06-11 NOTE — Progress Notes (Signed)
Kindly inform the patient that EEG study was normal

## 2019-06-15 ENCOUNTER — Telehealth: Payer: Self-pay | Admitting: Neurology

## 2019-06-15 ENCOUNTER — Telehealth: Payer: Self-pay

## 2019-06-15 NOTE — Telephone Encounter (Signed)
I called pt that EEG was normal. PT verbalized understanding. He wants report sent to [name] his PCP.

## 2019-06-15 NOTE — Telephone Encounter (Signed)
-----   Message from Garvin Fila, MD sent at 06/11/2019  7:41 AM EDT ----- [name] inform the patient that EEG study was normal

## 2019-06-15 NOTE — Telephone Encounter (Signed)
Report sent electronically to Dr. [name].

## 2019-06-15 NOTE — Telephone Encounter (Signed)
Phone rep checked office voicemail's,at 12:06 pt left message asking for the results to his procedure done on 05-26.  Pt was called and told the EEG results were normal.  Pt is asking if RN can send the results to Dr Joylene Draft

## 2019-06-23 ENCOUNTER — Ambulatory Visit: Payer: Medicare Other | Admitting: Neurology

## 2019-07-16 ENCOUNTER — Other Ambulatory Visit (HOSPITAL_BASED_OUTPATIENT_CLINIC_OR_DEPARTMENT_OTHER): Payer: Self-pay | Admitting: Internal Medicine

## 2019-07-16 DIAGNOSIS — K439 Ventral hernia without obstruction or gangrene: Secondary | ICD-10-CM

## 2019-07-16 DIAGNOSIS — K469 Unspecified abdominal hernia without obstruction or gangrene: Secondary | ICD-10-CM | POA: Insufficient documentation

## 2019-07-17 ENCOUNTER — Other Ambulatory Visit: Payer: Self-pay

## 2019-07-17 ENCOUNTER — Other Ambulatory Visit (HOSPITAL_BASED_OUTPATIENT_CLINIC_OR_DEPARTMENT_OTHER): Payer: Self-pay | Admitting: Internal Medicine

## 2019-07-17 ENCOUNTER — Ambulatory Visit (HOSPITAL_BASED_OUTPATIENT_CLINIC_OR_DEPARTMENT_OTHER)
Admission: RE | Admit: 2019-07-17 | Discharge: 2019-07-17 | Disposition: A | Discharge status: Home / Self Care | Payer: Medicare Other | Source: Ambulatory Visit | Attending: Internal Medicine | Admitting: Internal Medicine

## 2019-07-17 ENCOUNTER — Other Ambulatory Visit (HOSPITAL_BASED_OUTPATIENT_CLINIC_OR_DEPARTMENT_OTHER): Payer: Self-pay | Admitting: Endocrinology

## 2019-07-17 DIAGNOSIS — K439 Ventral hernia without obstruction or gangrene: Secondary | ICD-10-CM | POA: Insufficient documentation

## 2019-10-12 ENCOUNTER — Other Ambulatory Visit (HOSPITAL_COMMUNITY): Payer: Self-pay | Admitting: Family

## 2019-10-12 DIAGNOSIS — U071 COVID-19: Secondary | ICD-10-CM

## 2019-10-12 NOTE — Progress Notes (Signed)
I connected by phone with Mark Bowen on 10/12/2019 at 4:37 PM to discuss the potential use of a new treatment for mild to moderate COVID-19 viral infection in non-hospitalized patients.  This patient is a 84 y.o. male that meets the FDA criteria for Emergency Use Authorization of COVID monoclonal antibody casirivimab/imdevimab or bamlanivimab/eteseviamb.  Has a (+) direct SARS-CoV-2 viral test result  Has mild or moderate COVID-19   Is NOT hospitalized due to COVID-19  Is within 10 days of symptom onset  Has at least one of the high risk factor(s) for progression to severe COVID-19 and/or hospitalization as defined in EUA.  Specific high risk criteria : Older age (>/= 82 yo), BMI > 25, Diabetes, Immunosuppressive Disease or Treatment and Cardiovascular disease or hypertension   Symptoms of fever, chills, aches, and H/A began 10/11/19.   I have spoken and communicated the following to the patient or parent/caregiver regarding COVID monoclonal antibody treatment:  1. FDA has authorized the emergency use for the treatment of mild to moderate COVID-19 in adults and pediatric patients with positive results of direct SARS-CoV-2 viral testing who are 81 years of age and older weighing at least 40 kg, and who are at high risk for progressing to severe COVID-19 and/or hospitalization.  2. The significant known and potential risks and benefits of COVID monoclonal antibody, and the extent to which such potential risks and benefits are unknown.  3. Information on available alternative treatments and the risks and benefits of those alternatives, including clinical trials.  4. Patients treated with COVID monoclonal antibody should continue to self-isolate and use infection control measures (e.g., wear mask, isolate, social distance, avoid sharing personal items, clean and disinfect "high touch" surfaces, and frequent handwashing) according to CDC guidelines.   5. The patient or parent/caregiver has  the option to accept or refuse COVID monoclonal antibody treatment.  After reviewing this information with the patient, he consented to the medication.  [name], NP 10/12/2019 4:37 PM

## 2019-10-13 ENCOUNTER — Other Ambulatory Visit (HOSPITAL_COMMUNITY): Payer: Self-pay

## 2019-10-13 ENCOUNTER — Ambulatory Visit (HOSPITAL_COMMUNITY)
Admission: RE | Admit: 2019-10-13 | Discharge: 2019-10-13 | Disposition: A | Discharge status: Home / Self Care | Payer: Medicare Other | Source: Ambulatory Visit | Attending: Pulmonary Disease | Admitting: Pulmonary Disease

## 2019-10-13 DIAGNOSIS — Z23 Encounter for immunization: Secondary | ICD-10-CM | POA: Insufficient documentation

## 2019-10-13 DIAGNOSIS — E119 Type 2 diabetes mellitus without complications: Secondary | ICD-10-CM | POA: Insufficient documentation

## 2019-10-13 DIAGNOSIS — R54 Age-related physical debility: Secondary | ICD-10-CM | POA: Diagnosis not present

## 2019-10-13 DIAGNOSIS — U071 COVID-19: Secondary | ICD-10-CM | POA: Diagnosis present

## 2019-10-13 DIAGNOSIS — I10 Essential (primary) hypertension: Secondary | ICD-10-CM | POA: Insufficient documentation

## 2019-10-13 MED ORDER — ALBUTEROL SULFATE HFA 108 (90 BASE) MCG/ACT IN AERS: 2.0000 | INHALATION_SPRAY | Freq: Once | RESPIRATORY_TRACT | Status: DC | PRN

## 2019-10-13 MED ORDER — DIPHENHYDRAMINE HCL 50 MG/ML IJ SOLN: 50.0000 mg | Freq: Once | INTRAMUSCULAR | Status: DC | PRN

## 2019-10-13 MED ORDER — EPINEPHRINE 0.3 MG/0.3ML IJ SOAJ: 0.3000 mg | Freq: Once | INTRAMUSCULAR | Status: DC | PRN

## 2019-10-13 MED ORDER — SODIUM CHLORIDE 0.9 % IV SOLN: INTRAVENOUS | Status: DC | PRN

## 2019-10-13 MED ORDER — METHYLPREDNISOLONE SODIUM SUCC 125 MG IJ SOLR: 125.0000 mg | Freq: Once | INTRAMUSCULAR | Status: DC | PRN

## 2019-10-13 MED ORDER — SODIUM CHLORIDE 0.9 % IV SOLN
1200.0000 mg | Freq: Once | INTRAVENOUS | Status: AC
  Administered 2019-10-13: 1200 mg via INTRAVENOUS

## 2019-10-13 MED ORDER — FAMOTIDINE IN NACL 20-0.9 MG/50ML-% IV SOLN: 20.0000 mg | Freq: Once | INTRAVENOUS | Status: DC | PRN

## 2019-10-13 NOTE — Discharge Instructions (Signed)

## 2019-10-13 NOTE — Progress Notes (Signed)
  Diagnosis: COVID-19  Physician: Dr. Joya Gaskins  Procedure: [facility] Med: casirivimab\imdevimab infusion - Provided patient with casirivimab\imdevimab fact sheet for patients, parents and caregivers prior to infusion.  Complications: No immediate complications noted.  Discharge: Discharged home   Mark Bowen 10/13/2019

## 2019-11-25 ENCOUNTER — Other Ambulatory Visit: Payer: Self-pay | Admitting: *Deleted

## 2019-11-25 ENCOUNTER — Other Ambulatory Visit: Payer: Self-pay

## 2019-11-25 ENCOUNTER — Encounter: Payer: Self-pay | Admitting: Sports Medicine

## 2019-11-25 ENCOUNTER — Ambulatory Visit (INDEPENDENT_AMBULATORY_CARE_PROVIDER_SITE_OTHER): Payer: Medicare Other

## 2019-11-25 ENCOUNTER — Other Ambulatory Visit: Payer: Self-pay | Admitting: Sports Medicine

## 2019-11-25 ENCOUNTER — Ambulatory Visit (INDEPENDENT_AMBULATORY_CARE_PROVIDER_SITE_OTHER): Payer: Medicare Other | Admitting: Sports Medicine

## 2019-11-25 DIAGNOSIS — M79674 Pain in right toe(s): Secondary | ICD-10-CM | POA: Diagnosis not present

## 2019-11-25 DIAGNOSIS — M2041 Other hammer toe(s) (acquired), right foot: Secondary | ICD-10-CM

## 2019-11-25 DIAGNOSIS — E119 Type 2 diabetes mellitus without complications: Secondary | ICD-10-CM

## 2019-11-25 DIAGNOSIS — L84 Corns and callosities: Secondary | ICD-10-CM | POA: Diagnosis not present

## 2019-11-25 DIAGNOSIS — M79675 Pain in left toe(s): Secondary | ICD-10-CM

## 2019-11-25 DIAGNOSIS — B351 Tinea unguium: Secondary | ICD-10-CM

## 2019-11-25 DIAGNOSIS — M2042 Other hammer toe(s) (acquired), left foot: Secondary | ICD-10-CM

## 2019-11-25 NOTE — Progress Notes (Signed)
Subjective: Mark Bowen is a 84 y.o. male patient with history of diabetes who presents to office today complaining of pain at right 2nd toe and long,mildly painful nails  while ambulating in shoes; unable to trim. Patient states that the glucose reading this morning was not recorded, last A1c was around 6.7. No other issues noted.  Review of Systems  All other systems reviewed and are negative.    Patient Active Problem List   Diagnosis Date Noted  . S/P cervical spinal fusion 03/02/2019  . Encounter for removal of biliary stent   . Choledocholithiasis   . Bile leak   . Acute cholecystitis 01/12/2019  . Chronic pain of right knee 09/05/2018  . Pain 11/01/2017  . HLD (hyperlipidemia) 06/10/2016  . Sepsis ([place]) 06/10/2016  . Diabetes mellitus without complication ([place]) 31/49/7026  . Fever   . DIVERTICULITIS-COLON 01/08/2008  . CHOLELITHIASIS 01/08/2008  . PERSONAL HX COLONIC POLYPS 01/08/2008  . DIABETES MELLITUS, TYPE I, CONTROLLED 12/31/2007  . Essential hypertension 12/31/2007  . GERD 12/31/2007  . ARTHRITIS 12/31/2007  . HEMORRHOIDS, INTERNAL 05/02/2006  . DIVERTICULOSIS, COLON 05/02/2006   Current Outpatient Medications on File Prior to Visit  Medication Sig Dispense Refill  . aspirin 81 MG tablet Take 81 mg by mouth daily.    . benazepril (LOTENSIN) 20 MG tablet Take 1 tablet (20 mg total) by mouth daily. 30 tablet 1  . benazepril (LOTENSIN) 40 MG tablet Take 40 mg by mouth daily.    . Carboxymethylcellul-Glycerin (LUBRICATING EYE DROPS OP) Place 1 drop into both eyes daily as needed (irritation).    . fish oil-omega-3 fatty acids 1000 MG capsule Take 1 g by mouth daily.     Marland Kitchen gabapentin (NEURONTIN) 600 MG tablet Take 0.5 tablets (300 mg total) by mouth 2 (two) times daily. 30 tablet 0  . metFORMIN (GLUCOPHAGE-XR) 500 MG 24 hr tablet Take 1,000 mg by mouth 2 (two) times daily.    Marland Kitchen omeprazole (PRILOSEC) 20 MG capsule Take 20 mg by mouth daily.    . polyethylene glycol  (MIRALAX / GLYCOLAX) 17 g packet Take 17 g by mouth daily as needed for mild constipation. 14 each 0  . saccharomyces boulardii (FLORASTOR) 250 MG capsule Take 1 capsule (250 mg total) by mouth 2 (two) times daily. You can find a probiotic to take over the counter.    . simvastatin (ZOCOR) 20 MG tablet Take 20 mg by mouth at bedtime.    . sitaGLIPtin (JANUVIA) 100 MG tablet Take 100 mg by mouth daily.    Marland Kitchen trolamine salicylate (ASPERCREME) 10 % cream Apply 1 application topically as needed for muscle pain.     No current facility-administered medications on file prior to visit.   Allergies  Allergen Reactions  . Iodine Hives  . Iohexol Hives         No results found for this or any previous visit (from the past 2160 hour(s)).  Objective: General: Patient is awake, alert, and oriented x 3 and in no acute distress.  Integument: Skin is warm, dry and supple bilateral. Nails are tender, long, thickened and dystrophic with subungual debris, consistent with onychomycosis, 1-5 bilateral. No signs of infection.  Callus noted at right second toe medial aspect.  Remaining integument unremarkable.  Vasculature:  Dorsalis Pedis pulse 1 /4 bilateral. Posterior Tibial pulse  1/4 bilateral. Capillary fill time <3 sec 1-5 bilateral. Positive hair growth to the level of the digits. Temperature gradient within normal limits. No varicosities present bilateral.  No edema present bilateral.   Neurology: Vibratory sensation intact bilaterally.  Musculoskeletal: Asymptomatic hammertoe and bunion pedal deformities noted bilateral. Muscular strength 5/5 in all lower extremity muscular groups bilateral without pain on range of motion   xrays consistent with bunion and hammertoe  Assessment and Plan: Problem List Items Addressed This Visit      Endocrine   Diabetes mellitus without complication ([place])    Other Visit Diagnoses    Hammer toe of right foot    -  Primary   Pain due to onychomycosis of  toenails of both feet       Corn of toe          -Examined patient. -Discussed and educated patient on diabetic foot care, especially with  regards to the vascular, neurological and musculoskeletal systems.  -Stressed the importance of good glycemic control and the detriment of not  controlling glucose levels in relation to the foot. -Mechanically debrided corn at right 2nd toe using 15 blade without incident and all nails 1-5 bilateral using sterile nail nipper and filed with dremel without incident  -Dispensed toe caps for patient to use at second toes bilateral -Answered all patient questions -Patient to return  in 3 months for at risk foot care -Patient advised to call the office if any problems or questions arise in the meantime.  Landis Martins, DPM

## 2020-02-21 IMAGING — US US ABDOMEN LIMITED
1 series · 14 of 25 positions shown · non-contrast
Comparison: None.

CLINICAL DATA: Upper abdominal pain

EXAM:
ULTRASOUND ABDOMEN LIMITED RIGHT UPPER QUADRANT

[Series 1: us abdomen limited · 14 of 55 slices shown]
[im 1/55]
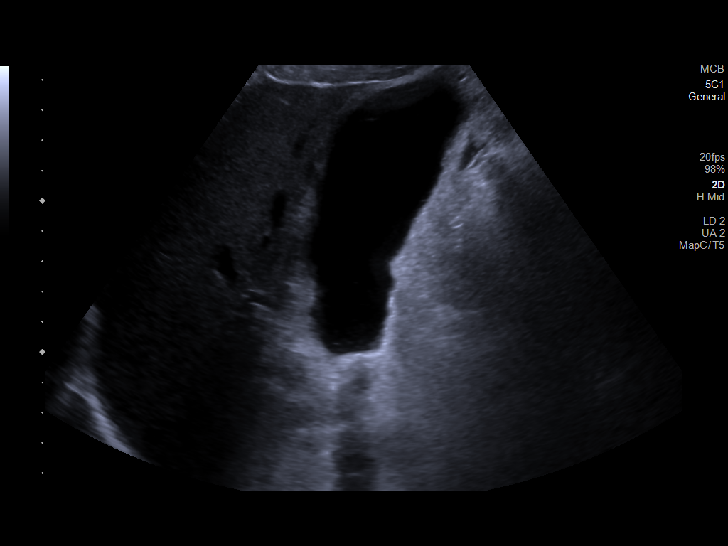
[im 5/55]
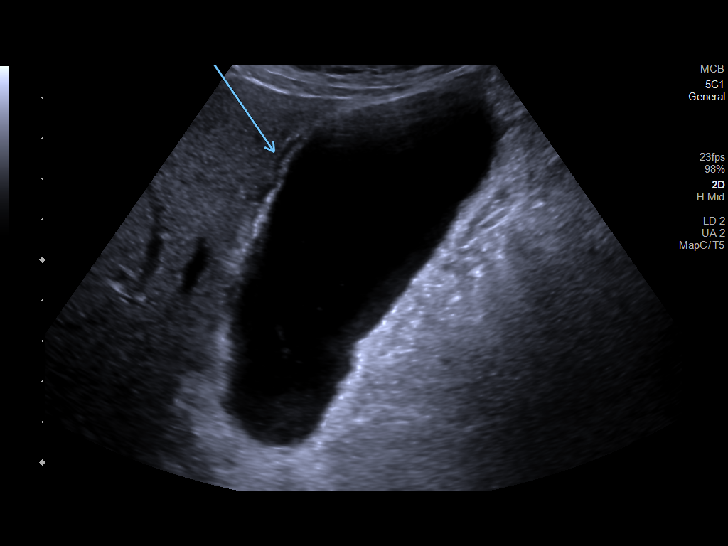
[im 10/55]
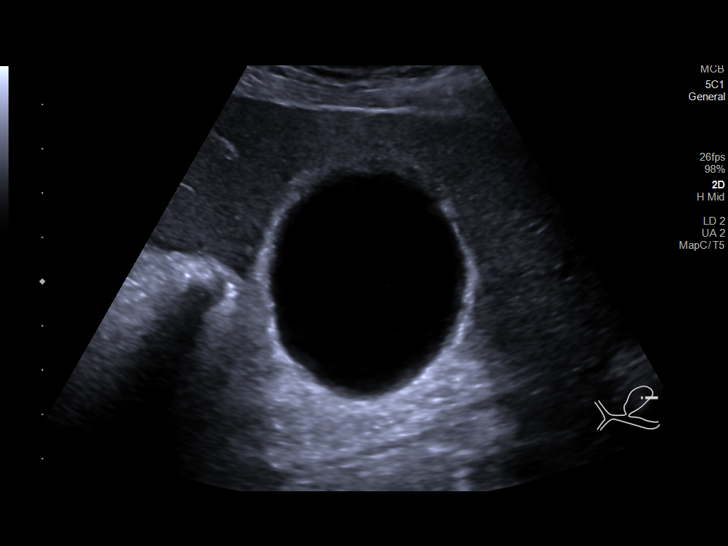
[im 14/55]
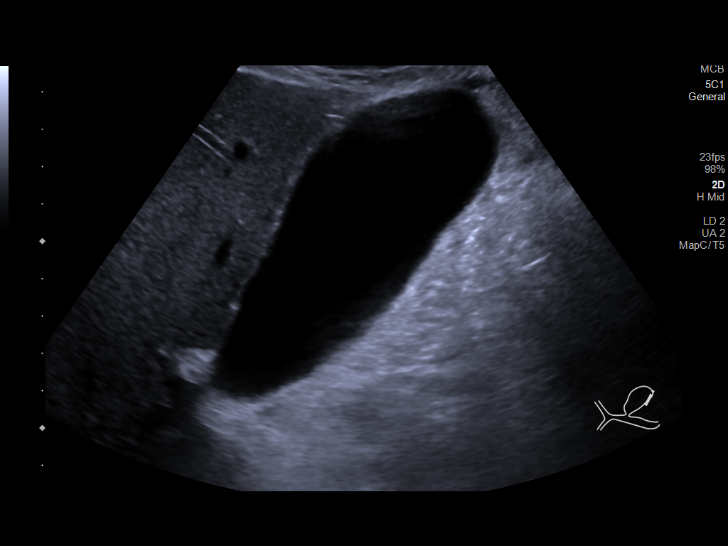
[im 19/55]
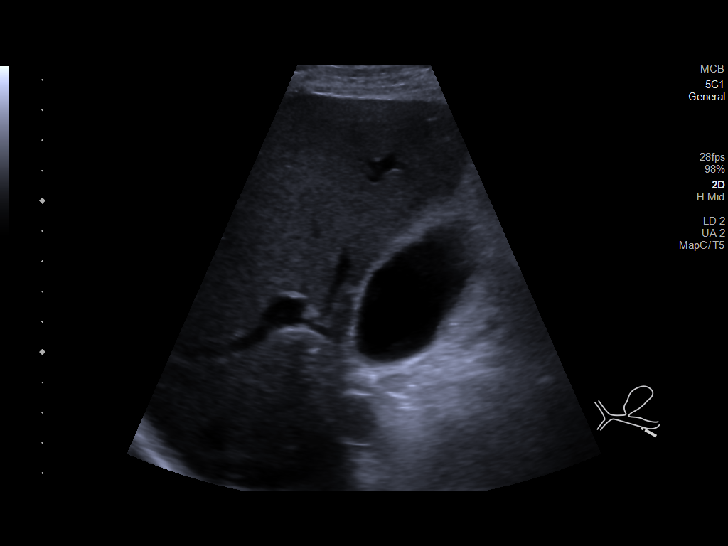
[im 21/55]
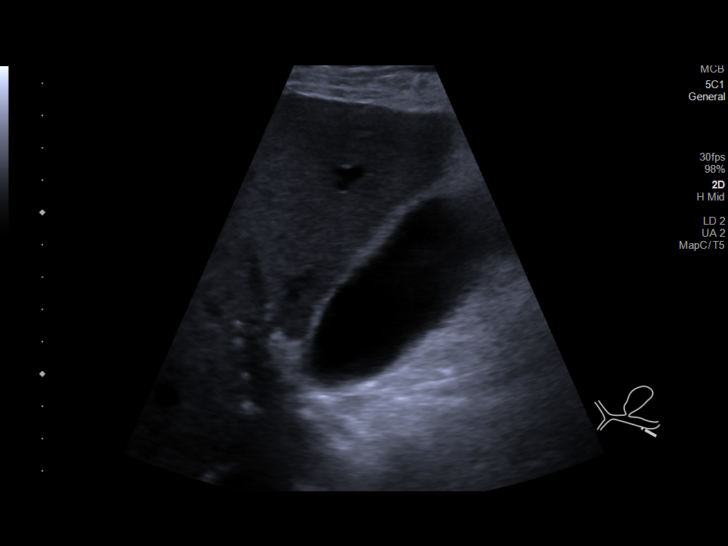
[im 25/55]
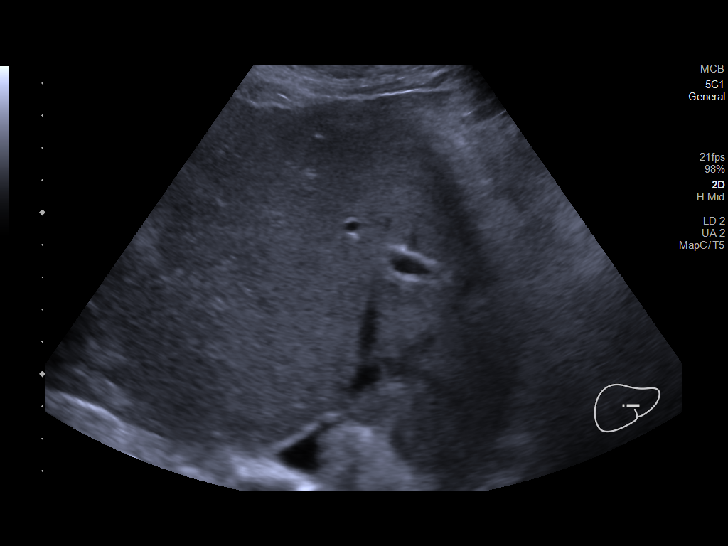
[im 30/55]
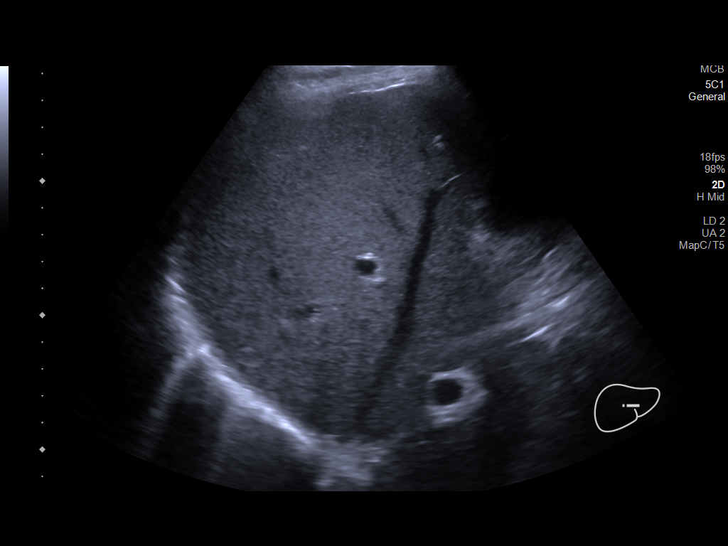
[im 34/55]
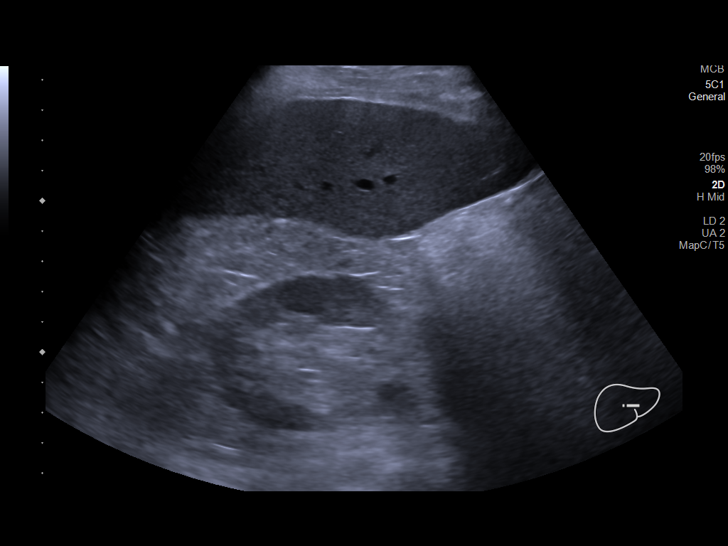
[im 37/55]
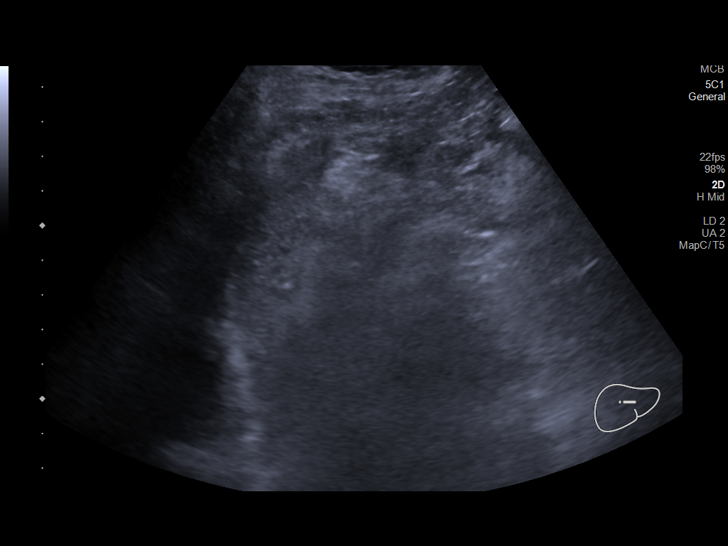
[im 41/55]
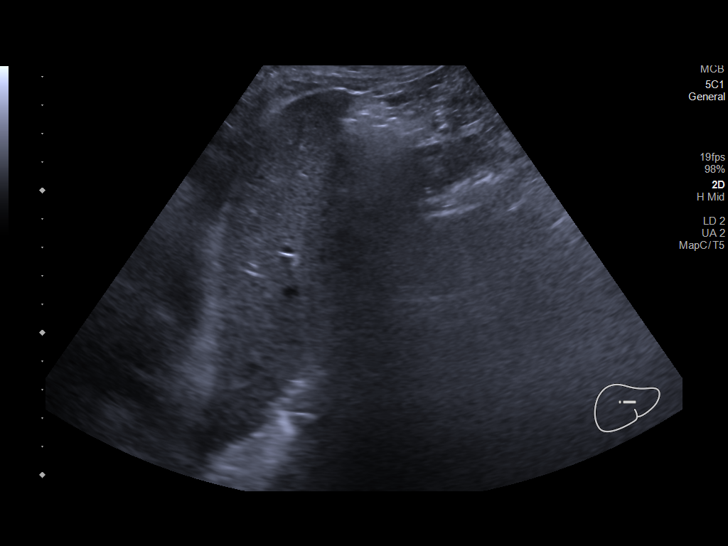
[im 46/55]
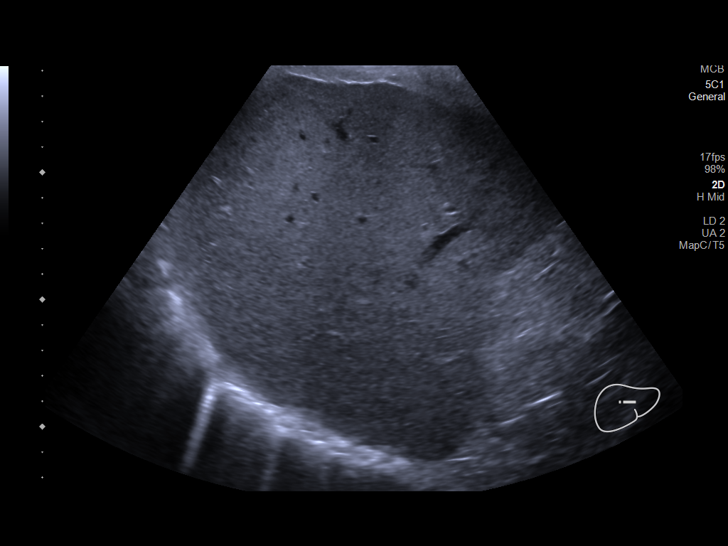
[im 50/55]
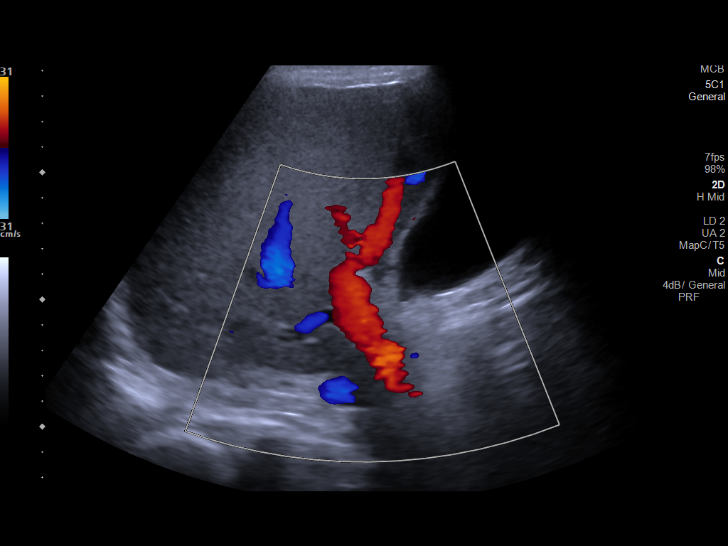
[im 55/55]
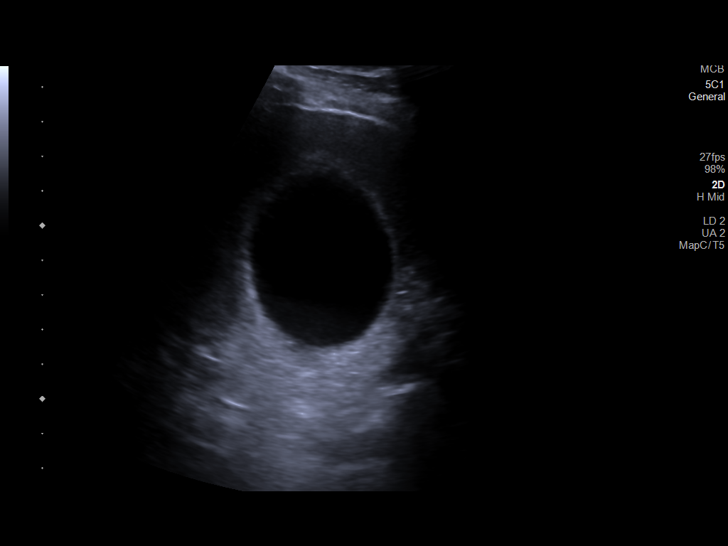

[14 of 25 positions shown; findings below may reference images not displayed]

FINDINGS: Gallbladder:

There is a 1.3 cm calculus adherent in the neck of the gallbladder.
The gallbladder wall is thickened with pericholecystic fluid. There
is tenderness focally over the gallbladder.

Common bile duct:

Diameter: 4 mm. No intrahepatic or extrahepatic biliary duct
dilatation.

Liver:

No focal lesion identified. Within normal limits in parenchymal
echogenicity. Portal vein is patent on color Doppler imaging with
normal direction of blood flow towards the liver.

Other: None.
IMPRESSION: There is a gallstone imbedded in the neck of the gallbladder with
gallbladder wall thickening, pericholecystic fluid, and focal
tenderness over the gallbladder. These are findings indicative of
acute cholecystitis.

Study otherwise unremarkable.

## 2020-02-25 ENCOUNTER — Ambulatory Visit: Payer: Medicare Other | Admitting: Podiatry

## 2020-02-25 ENCOUNTER — Other Ambulatory Visit: Payer: Self-pay | Admitting: *Deleted

## 2020-02-25 ENCOUNTER — Ambulatory Visit (INDEPENDENT_AMBULATORY_CARE_PROVIDER_SITE_OTHER): Payer: Medicare Other | Admitting: Podiatry

## 2020-02-25 ENCOUNTER — Encounter: Payer: Self-pay | Admitting: Podiatry

## 2020-02-25 ENCOUNTER — Other Ambulatory Visit: Payer: Self-pay

## 2020-02-25 DIAGNOSIS — M2042 Other hammer toe(s) (acquired), left foot: Secondary | ICD-10-CM

## 2020-02-25 DIAGNOSIS — L84 Corns and callosities: Secondary | ICD-10-CM

## 2020-02-25 DIAGNOSIS — M79675 Pain in left toe(s): Secondary | ICD-10-CM | POA: Diagnosis not present

## 2020-02-25 DIAGNOSIS — M79674 Pain in right toe(s): Secondary | ICD-10-CM | POA: Diagnosis not present

## 2020-02-25 DIAGNOSIS — B351 Tinea unguium: Secondary | ICD-10-CM

## 2020-02-25 DIAGNOSIS — M2012 Hallux valgus (acquired), left foot: Secondary | ICD-10-CM

## 2020-02-25 DIAGNOSIS — E119 Type 2 diabetes mellitus without complications: Secondary | ICD-10-CM

## 2020-02-25 DIAGNOSIS — M2011 Hallux valgus (acquired), right foot: Secondary | ICD-10-CM

## 2020-02-25 DIAGNOSIS — M2041 Other hammer toe(s) (acquired), right foot: Secondary | ICD-10-CM

## 2020-02-28 NOTE — Progress Notes (Signed)
  Subjective:  Patient ID: Mark Bowen, male    DOB: 1935-08-06,  MRN: [id number]  85 y.o. male presents with preventative diabetic foot care and corn(s) , interdigitally, right 2nd digit and painful thick toenails that are difficult to trim. Painful toenails interfere with ambulation. Aggravating factors include wearing enclosed shoe gear. Pain is relieved with periodic professional debridement. Painful corns are aggravated when weightbearing when wearing enclosed shoe gear. Pain is relieved with periodic professional debridement..    Patient did not check blood glucose this morning.  PCP: [name], MD and last visit was: 2-3 weeks ago.  Allergies  Allergen Reactions  . Iodine Hives  . Iohexol Hives         Objective:  There were no vitals filed for this visit. Constitutional Patient is a pleasant 85 y.o. Caucasian male WD, WN in NAD. AAO x 3.  Vascular Capillary fill time to digits <3 seconds b/l lower extremities. Faintly palpable DP pulse(s) b/l lower extremities. Pedal hair present. Lower extremity skin temperature gradient within normal limits. No cyanosis or clubbing noted.  Neurologic Normal speech. Protective sensation intact 5/5 intact bilaterally with 10g monofilament b/l.  Dermatologic Pedal skin with normal turgor, texture and tone bilaterally. No open wounds bilaterally. No interdigital macerations bilaterally. Toenails 1-5 b/l elongated, discolored, dystrophic, thickened, crumbly with subungual debris and tenderness to dorsal palpation. Hyperkeratotic lesion(s) R 2nd toe.  No erythema, no edema, no drainage, no fluctuance.  Orthopedic: Normal muscle strength 5/5 to all lower extremity muscle groups bilaterally. No pain crepitus or joint limitation noted with ROM b/l. Hallux valgus with bunion deformity noted b/l lower extremities. Hammertoes noted to the b/l lower extremities.   Hemoglobin A1C Latest Ref Rng & Units 04/14/2019  HGBA1C 4.8 - 5.6 % 6.5(H)  Some recent  data might be hidden   Assessment:   1. Pain due to onychomycosis of toenails of both feet   2. Corn of toe   3. Hallux valgus, acquired, bilateral   4. Acquired hammertoes of both feet   5. Diabetes mellitus without complication ([place])    Plan:  Patient was evaluated and treated and all questions answered.  Onychomycosis with pain -Nails palliatively debridement as below. -Educated on self-care  Procedure: Nail Debridement Rationale: Pain Type of Debridement: manual, sharp debridement. Instrumentation: Nail nipper, rotary burr. Number of Nails: 10  -Examined patient. -No new findings. No new orders. -Continue diabetic foot care principles. -Patient to continue soft, supportive shoe gear daily. -Toenails 1-5 b/l were debrided in length and girth with sterile nail nippers and dremel without iatrogenic bleeding.  -Corn(s) R 2nd toe pared utilizing sterile scalpel blade without complication or incident. Total number debrided=1. -Patient to report any pedal injuries to medical professional immediately. -Patient/POA to call should there be question/concern in the interim.  Return in about 3 months (around 05/24/2020).  Marzetta Board, DPM

## 2020-04-10 IMAGING — RF DG C-ARM 1-60 MIN
1 series · 2 of 2 positions shown · non-contrast
Comparison: None.

CLINICAL DATA: Anterior cervical fusion.

EXAM:
CERVICAL SPINE - 2-3 VIEW; DG C-ARM 1-60 MIN
FLUOROSCOPY TIME:  18 seconds.

[Series 1: run · 2 of 2 slices shown]
[im 1/2]
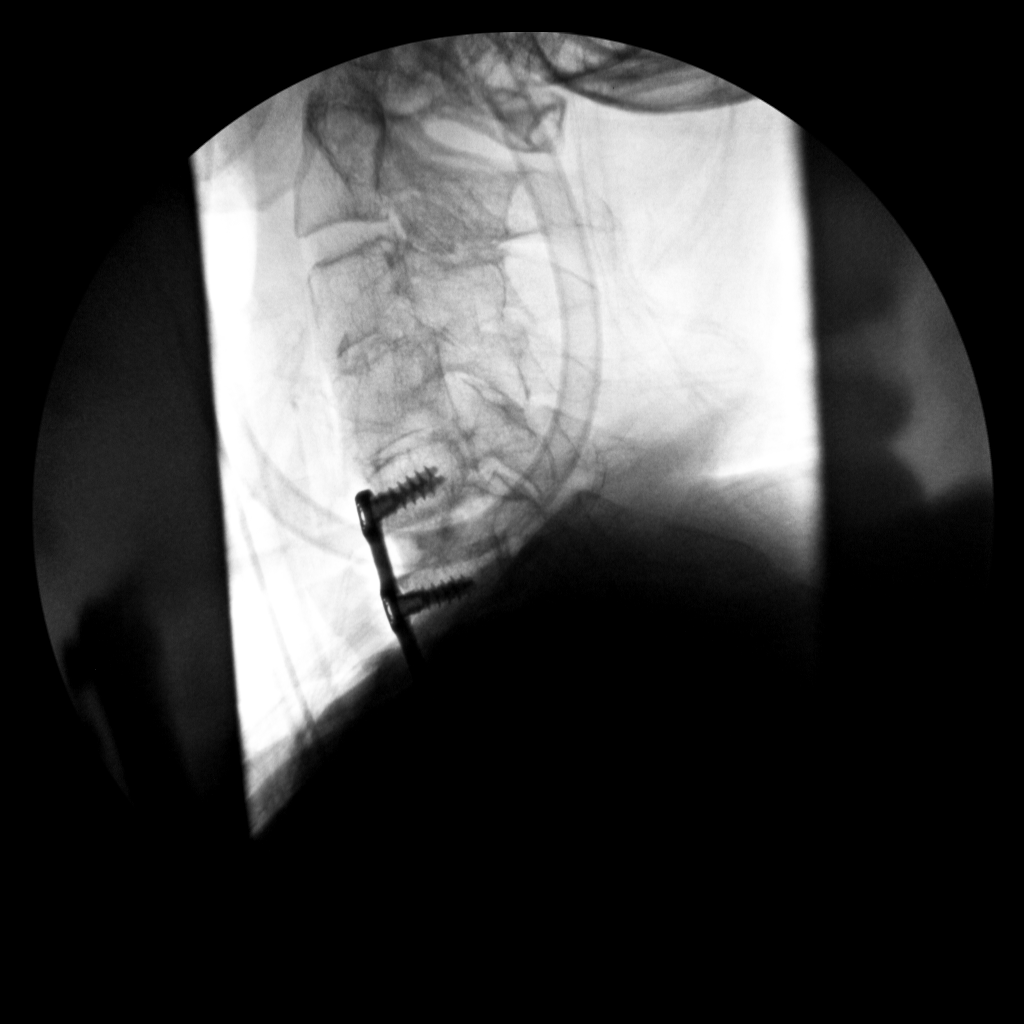
[im 2/2]
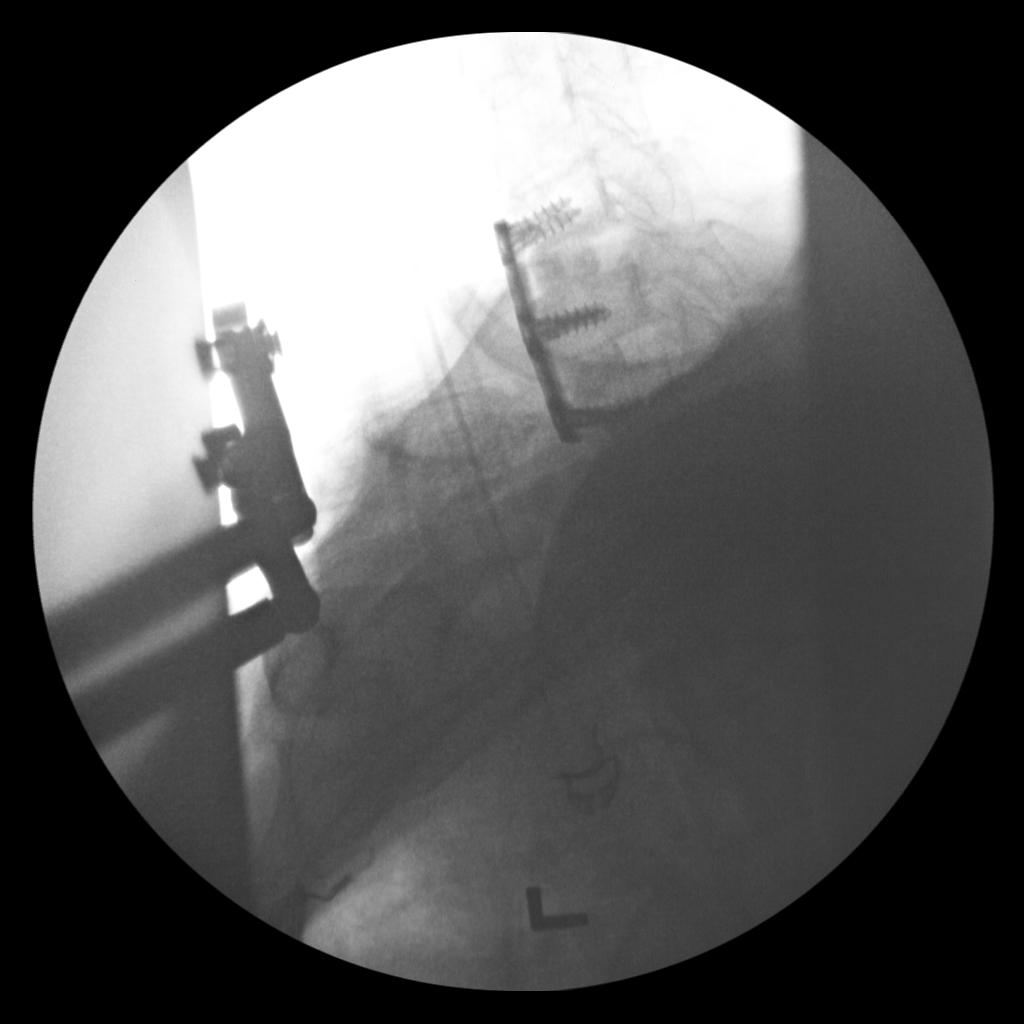

[2 of 2 positions shown; findings below may reference images not displayed]

FINDINGS: Two intraoperative fluoroscopic images were obtained of the cervical
spine. These demonstrate the patient be status post surgical
anterior fusion of C5-6 and C6-7. Good alignment of vertebral bodies
is noted.
IMPRESSION: Status post surgical anterior fusion of C5-6 and C6-7.

## 2020-06-16 ENCOUNTER — Encounter: Payer: Self-pay | Admitting: Podiatry

## 2020-06-16 ENCOUNTER — Ambulatory Visit (INDEPENDENT_AMBULATORY_CARE_PROVIDER_SITE_OTHER): Payer: Medicare Other | Admitting: Podiatry

## 2020-06-16 ENCOUNTER — Other Ambulatory Visit: Payer: Self-pay

## 2020-06-16 DIAGNOSIS — M79675 Pain in left toe(s): Secondary | ICD-10-CM | POA: Diagnosis not present

## 2020-06-16 DIAGNOSIS — M2041 Other hammer toe(s) (acquired), right foot: Secondary | ICD-10-CM

## 2020-06-16 DIAGNOSIS — M2042 Other hammer toe(s) (acquired), left foot: Secondary | ICD-10-CM

## 2020-06-16 DIAGNOSIS — L84 Corns and callosities: Secondary | ICD-10-CM | POA: Diagnosis not present

## 2020-06-16 DIAGNOSIS — M79674 Pain in right toe(s): Secondary | ICD-10-CM | POA: Diagnosis not present

## 2020-06-16 DIAGNOSIS — E119 Type 2 diabetes mellitus without complications: Secondary | ICD-10-CM

## 2020-06-16 DIAGNOSIS — B351 Tinea unguium: Secondary | ICD-10-CM

## 2020-06-16 NOTE — Patient Instructions (Signed)
Apply triple antibiotic ointment to left great toe once daily for 3 days. Call office if you have any problems.

## 2020-06-16 NOTE — Progress Notes (Signed)
ANNUAL DIABETIC FOOT EXAM  Subjective: Mark Bowen presents today for preventative diabetic foot care, for annual diabetic foot examination, and corn(s) right 2nd toe and painful thick toenails that are difficult to trim. Painful toenails interfere with ambulation. Aggravating factors include wearing enclosed shoe gear. Pain is relieved with periodic professional debridement. Painful corns are aggravated when weightbearing when wearing enclosed shoe gear. Pain is relieved with periodic professional debridement..  Patient relates several year h/o diabetes.  Patient denies any h/o foot wounds.  Patient denies symptoms of foot numbness.  Patient denies symptoms of foot tingling.  Patient denies symptoms of burning in feet.  Patient denies symptoms of pins/needles in feet.  Patient's blood sugar was 130 mg/dl one week ago.  Patient did not check blood glucose this morning.  [name], MD is patient's PCP. Last visit was four months ago.  Past Medical History:  Diagnosis Date   Acute cholecystitis 01/2019   Arthritis    Diabetes mellitus 2007   type 2   Diverticulitis    GERD (gastroesophageal reflux disease)    02/27/2019- not currently   Hypercholesterolemia since 2008 preventative   taking Zocor, now cholesterol WNL   Hypertension since 1970   Prostate CA [facility])    Shingles    Patient Active Problem List   Diagnosis Date Noted   S/P cervical spinal fusion 03/02/2019   Encounter for removal of biliary stent    Choledocholithiasis    Bile leak    Acute cholecystitis 01/12/2019   Cervical radiculitis 12/09/2018   Elevated blood-pressure reading, without diagnosis of hypertension 12/09/2018   Foraminal stenosis of cervical region 12/09/2018   Neck pain 11/13/2018   Chronic pain of right knee 09/05/2018   Pain 11/01/2017   HLD (hyperlipidemia) 06/10/2016   Sepsis ([place]) 06/10/2016   Diabetes mellitus without complication ([place]) 40/98/1191   Fever    Lumbar  spondylosis with myelopathy 08/10/2014   DIVERTICULITIS-COLON 01/08/2008   CHOLELITHIASIS 01/08/2008   PERSONAL HX COLONIC POLYPS 01/08/2008   DIABETES MELLITUS, TYPE I, CONTROLLED 12/31/2007   Essential hypertension 12/31/2007   GERD 12/31/2007   ARTHRITIS 12/31/2007   HEMORRHOIDS, INTERNAL 05/02/2006   DIVERTICULOSIS, COLON 05/02/2006   Past Surgical History:  Procedure Laterality Date   ANTERIOR CERVICAL DECOMP/DISCECTOMY FUSION N/A 03/02/2019   Procedure: ANTERIOR CERVICAL DECOMPRESSION FUSION - CERVICAL FIVE-CERVICAL SIX - CERVICAL SIX-CERVICAL SEVEN;  Surgeon: Eustace Moore, MD;  Location: [place];  Service: Neurosurgery;  Laterality: N/A;   [place] 2009   2009 - discectomy   BILIARY STENT PLACEMENT  01/14/2019   Procedure: BILIARY STENT PLACEMENT;  Surgeon: Irene Shipper, MD;  Location: [place];  Service: Endoscopy;;   CHOLECYSTECTOMY N/A 01/13/2019   Procedure: LAPAROSCOPIC SUBTOTAL CHOLECYSTECTOMY, WITH PLACEMENT OF DRAIN;  Surgeon: Ileana Roup, MD;  Location: [place];  Service: General;  Laterality: N/A;   COLONOSCOPY     ERCP N/A 01/14/2019   Procedure: ENDOSCOPIC RETROGRADE CHOLANGIOPANCREATOGRAPHY (ERCP);  Surgeon: Irene Shipper, MD;  Location: [facility] ENDOSCOPY;  Service: Endoscopy;  Laterality: N/A;   ERCP N/A 02/23/2019   Procedure: ENDOSCOPIC RETROGRADE CHOLANGIOPANCREATOGRAPHY (ERCP);  Surgeon: Irene Shipper, MD;  Location: [facility] END[facility]COPY;  Service: Endoscopy;  Laterality: N/A;   INGUINAL HERNIA REPAIR Bilateral 1970   bilateral   KNEE SURGERY Right 11/2018   PROSTATE SURGERY     SPHINCTEROTOMY  01/14/2019   Procedure: SPHINCTEROTOMY;  Surgeon: Irene Shipper, MD;  Location: [facility] ENDOSCOPY;  Service: Endoscopy;;   STENT REMOVAL  02/23/2019   Procedure: STENT REMOVAL;  Surgeon: Irene Shipper, MD;  Location: [facility] ENDOSCOPY;  Service: Endoscopy;;   TONSILLECTOMY  1960   Current Outpatient Medications on File Prior to Visit  Medication Sig  Dispense Refill   amLODipine (NORVASC) 5 MG tablet Take 5 mg by mouth daily.     aspirin 81 MG tablet Take 81 mg by mouth daily.     benazepril (LOTENSIN) 20 MG tablet Take 1 tablet (20 mg total) by mouth daily. 30 tablet 1   benazepril (LOTENSIN) 40 MG tablet Take 40 mg by mouth daily.     Carboxymethylcellul-Glycerin (LUBRICATING EYE DROPS OP) Place 1 drop into both eyes daily as needed (irritation).     fish oil-omega-3 fatty acids 1000 MG capsule Take 1 g by mouth daily.      gabapentin (NEURONTIN) 600 MG tablet Take 0.5 tablets (300 mg total) by mouth 2 (two) times daily. 30 tablet 0   metFORMIN (GLUCOPHAGE-XR) 500 MG 24 hr tablet Take 1,000 mg by mouth 2 (two) times daily.     metroNIDAZOLE (FLAGYL) 500 MG tablet Take 500 mg by mouth 3 (three) times daily.     omeprazole (PRILOSEC) 20 MG capsule Take 20 mg by mouth daily.     polyethylene glycol (MIRALAX / GLYCOLAX) 17 g packet Take 17 g by mouth daily as needed for mild constipation. 14 each 0   saccharomyces boulardii (FLORASTOR) 250 MG capsule Take 1 capsule (250 mg total) by mouth 2 (two) times daily. You can find a probiotic to take over the counter.     simvastatin (ZOCOR) 20 MG tablet Take 20 mg by mouth at bedtime.     sitaGLIPtin (JANUVIA) 100 MG tablet Take 100 mg by mouth daily.     trolamine salicylate (ASPERCREME) 10 % cream Apply 1 application topically as needed for muscle pain.     No current facility-administered medications on file prior to visit.    Allergies  Allergen Reactions   Iodine Hives   Iohexol Hives        Social History   Occupational History   Not on file  Tobacco Use   Smoking status: Former    Pack years: 0.00    Types: Cigarettes    Quit date: 04/04/1959    Years since quitting: 61.2   Smokeless tobacco: Never  Vaping Use   Vaping Use: Never used  Substance and Sexual Activity   Alcohol use: No    Comment: non achololic   Drug use: No   Sexual activity: Not on file   Family History   Problem Relation Age of Onset   Lung cancer Father    Prostate cancer Brother        x 3 brothers   Colon cancer Neg Hx    Stomach cancer Neg Hx    Immunization History  Administered Date(s) Administered   PFIZER(Purple Top)SARS-COV-2 Vaccination 03/08/2019, 04/07/2019     Review of Systems: Negative except as noted in the HPI.  Objective: There were no vitals filed for this visit.  CHIDIEBERE WYNN is a pleasant 85 y.o. male in NAD. AAO X 3.  Vascular Examination: Capillary fill time to digits <3 seconds b/l lower extremities. Faintly palpable pedal pulses b/l. Pedal hair present. Lower extremity skin temperature gradient within normal limits. No pain with calf compression b/l.  Dermatological Examination: Pedal skin with normal turgor, texture and tone bilaterally. No open wounds bilaterally. No interdigital macerations  bilaterally. Toenails 1-5 b/l elongated, discolored, dystrophic, thickened, crumbly with subungual debris and tenderness to dorsal palpation. Hyperkeratotic lesion(s) R 2nd toe.  No erythema, no edema, no drainage, no fluctuance.  Musculoskeletal Examination: Normal muscle strength 5/5 to all lower extremity muscle groups bilaterally. No pain crepitus or joint limitation noted with ROM b/l. Hallux valgus with bunion deformity noted b/l lower extremities. Hammertoe(s) noted to the 2-5 bilaterally.  Footwear Assessment: Does the patient wear appropriate shoes? Yes. Does the patient need inserts/orthotics? No.  Neurological Examination: Protective sensation intact 5/5 intact bilaterally with 10g monofilament b/l. Vibratory sensation intact b/l.  Assessment: 1. Pain due to onychomycosis of toenails of both feet   2. Corn of toe   3. Diabetes mellitus without complication ([place])   4. Acquired hammertoes of both feet   5. Encounter for diabetic foot exam ([place])     ADA Risk Categorization: Low Risk :  Patient has all of the following: Intact protective  sensation No prior foot ulcer  No severe deformity Pedal pulses present  Plan: -Examined patient. -Diabetic foot examination performed today. -Patient to continue soft, supportive shoe gear daily. -Toenails 1-5 b/l were debrided in length and girth with sterile nail nippers and dremel. Iatrogenic laceration sustained during L hallux.  Treated with Lumicain Hemostatic Solution and alcohol. Patient instructed to apply Neosporin to L hallux once daily for 3 days. -Corn(s) R 2nd toe pared utilizing sterile scalpel blade without complication or incident. Total number debrided=1. -Patient to report any pedal injuries to medical professional immediately. -Dispensed digital toe caps for b/l 2nd toes. Apply every morning. Remove every evening. -Patient/POA to call should there be question/concern in the interim.  Return in about 3 months (around 09/16/2020).  Marzetta Board, DPM

## 2020-09-29 ENCOUNTER — Encounter: Payer: Self-pay | Admitting: Podiatry

## 2020-09-29 ENCOUNTER — Ambulatory Visit: Payer: Medicare Other | Admitting: Podiatry

## 2020-09-29 ENCOUNTER — Ambulatory Visit (INDEPENDENT_AMBULATORY_CARE_PROVIDER_SITE_OTHER): Payer: Medicare Other | Admitting: Podiatry

## 2020-09-29 ENCOUNTER — Other Ambulatory Visit: Payer: Self-pay

## 2020-09-29 DIAGNOSIS — M79674 Pain in right toe(s): Secondary | ICD-10-CM | POA: Diagnosis not present

## 2020-09-29 DIAGNOSIS — L84 Corns and callosities: Secondary | ICD-10-CM

## 2020-09-29 DIAGNOSIS — B351 Tinea unguium: Secondary | ICD-10-CM | POA: Diagnosis not present

## 2020-09-29 DIAGNOSIS — M2040 Other hammer toe(s) (acquired), unspecified foot: Secondary | ICD-10-CM | POA: Insufficient documentation

## 2020-09-29 DIAGNOSIS — E119 Type 2 diabetes mellitus without complications: Secondary | ICD-10-CM

## 2020-09-29 DIAGNOSIS — M79675 Pain in left toe(s): Secondary | ICD-10-CM

## 2020-09-29 DIAGNOSIS — U071 COVID-19: Secondary | ICD-10-CM | POA: Insufficient documentation

## 2020-10-04 NOTE — Progress Notes (Signed)
  Subjective:  Patient ID: Mark Bowen, male    DOB: [birth year],  MRN: [id number]  Mark Bowen presents to clinic today for preventative diabetic foot care and corn(s) right 2nd toe and painful thick toenails that are difficult to trim. Painful toenails interfere with ambulation. Aggravating factors include wearing enclosed shoe gear. Pain is relieved with periodic professional debridement. Painful corns are aggravated when weightbearing when wearing enclosed shoe gear. Pain is relieved with periodic professional debridement.  Patient states blood glucose was 150 mg/dl today.    He states he has been using the toe caps and they work well for his 2nd digit corn. Mr. Mark Bowen voices no new pedal concerns on today's visit.  PCP is Mark Infante, MD , and last visit was 07/27/2020.  Allergies  Allergen Reactions   Iodine Hives   Iohexol Hives        Lexapro [Escitalopram]     Review of Systems: Negative except as noted in the HPI. Objective:   Constitutional Mark Bowen is a pleasant 85 y.o. Caucasian male, WD, WN in NAD. AAO x 3.   Vascular Capillary fill time to digits <3 seconds b/l lower extremities. Faintly palpable DP pulse(s) b/l lower extremities. Faintly palpable PT pulse(s) b/l lower extremities. Pedal hair sparse. Lower extremity skin temperature gradient within normal limits. No pain with calf compression b/l. No edema noted b/l lower extremities. No cyanosis or clubbing noted.  Neurologic Normal speech. Oriented to person, place, and time. Protective sensation intact 5/5 intact bilaterally with 10g monofilament b/l.  Dermatologic Skin warm and supple b/l lower extremities. No open wounds b/l lower extremities. No interdigital macerations b/l lower extremities. Toenails 1-5 b/l elongated, discolored, dystrophic, thickened, crumbly with subungual debris and tenderness to dorsal palpation. Corn right 2nd digit resolved. Hyperkeratotic lesion(s) L hallux and R hallux.  No  erythema, no edema, no drainage, no fluctuance.  Orthopedic: Normal muscle strength 5/5 to all lower extremity muscle groups bilaterally. Hallux valgus with bunion deformity noted b/l lower extremities. Hammertoe(s) noted to the 2-5 bilaterally.   Radiographs: None Assessment:   1. Pain due to onychomycosis of toenails of both feet   2. Callus   3. Diabetes mellitus without complication (Mark Bowen)    Plan:  Patient was evaluated and treated and all questions answered. Consent given for treatment as described below: -Examined patient. -Continue diabetic foot care principles: inspect feet daily, monitor glucose as recommended by PCP and/or Endocrinologist, and follow prescribed diet per PCP, Endocrinologist and/or dietician. -Patient to continue soft, supportive shoe gear daily. -Toenails 1-5 b/l were debrided in length and girth with sterile nail nippers and dremel without iatrogenic bleeding.  -Callus(es) L hallux and R hallux pared utilizing sterile scalpel blade without complication or incident. Total number debrided =2. -Patient to report any pedal injuries to medical professional immediately. -Continue toe caps to afftected digits daily for protection. -Patient/POA to call should there be question/concern in the interim.  Return in about 3 months (around 12/29/2020).  Mark Bowen, DPM

## 2020-10-25 DIAGNOSIS — Z6825 Body mass index (BMI) 25.0-25.9, adult: Secondary | ICD-10-CM | POA: Insufficient documentation

## 2020-10-25 DIAGNOSIS — M545 Low back pain, unspecified: Secondary | ICD-10-CM | POA: Insufficient documentation

## 2021-01-19 ENCOUNTER — Encounter: Payer: Self-pay | Admitting: Podiatry

## 2021-01-19 ENCOUNTER — Other Ambulatory Visit: Payer: Self-pay

## 2021-01-19 ENCOUNTER — Ambulatory Visit (INDEPENDENT_AMBULATORY_CARE_PROVIDER_SITE_OTHER): Payer: Medicare Other | Admitting: Podiatry

## 2021-01-19 DIAGNOSIS — M79674 Pain in right toe(s): Secondary | ICD-10-CM | POA: Diagnosis not present

## 2021-01-19 DIAGNOSIS — M79675 Pain in left toe(s): Secondary | ICD-10-CM

## 2021-01-19 DIAGNOSIS — Q828 Other specified congenital malformations of skin: Secondary | ICD-10-CM | POA: Diagnosis not present

## 2021-01-19 DIAGNOSIS — B351 Tinea unguium: Secondary | ICD-10-CM | POA: Diagnosis not present

## 2021-01-19 DIAGNOSIS — M2041 Other hammer toe(s) (acquired), right foot: Secondary | ICD-10-CM

## 2021-01-19 DIAGNOSIS — E119 Type 2 diabetes mellitus without complications: Secondary | ICD-10-CM | POA: Diagnosis not present

## 2021-01-19 DIAGNOSIS — M2042 Other hammer toe(s) (acquired), left foot: Secondary | ICD-10-CM

## 2021-01-19 NOTE — Progress Notes (Signed)
ANNUAL DIABETIC FOOT EXAM  Subjective: Mark Bowen presents today for for annual diabetic foot examination and painful porokeratotic lesions right 2nd toe.  Pain prevent comfortable ambulation. Aggravating factor is weightbearing with or without shoegear..  Patient relates >10 year h/o diabetes.  Patient denies any h/o foot wounds.  Patient denies any numbness, tingling, burning, or pins/needle sensation in feet.  Patient's blood sugar was 135 mg/dl one month.  Patient does not monitor blood glucose daily.  Mark Infante, MD is patient's PCP. Last visit was 01/04/2021.  Past Medical History:  Diagnosis Date   Acute cholecystitis 01/2019   Arthritis    Diabetes mellitus 2007   type 2   Diverticulitis    GERD (gastroesophageal reflux disease)    02/27/2019- not currently   Hypercholesterolemia since 2008 preventative   taking Zocor, now cholesterol WNL   Hypertension since 1970   Prostate CA [facility])    Shingles    Patient Active Problem List   Diagnosis Date Noted   Acute low back pain 10/25/2020   Body mass index (BMI) 25.0-25.9, adult 10/25/2020   COVID-19 09/29/2020   Hammer toe 09/29/2020   Unspecified abdominal hernia without obstruction or gangrene 07/16/2019   Amnesia 04/02/2019   S/P cervical spinal fusion 03/02/2019   Encounter for removal of biliary stent    Choledocholithiasis    Bile leak    Acute cholecystitis 01/12/2019   Cervical radiculitis 12/09/2018   Elevated blood-pressure reading, without diagnosis of hypertension 12/09/2018   Foraminal stenosis of cervical region 12/09/2018   Neck pain 11/13/2018   Chronic pain of right knee 09/05/2018   Pain in left foot 01/23/2018   Pain 11/01/2017   Acquired trigger finger 10/21/2017   Pain in right hand 10/21/2017   Tinea pedis 03/19/2017   Bite of nonvenomous arthropod 06/21/2016   Encephalitis and encephalomyelitis, unspecified 06/21/2016   Hypo-osmolality and hyponatremia 06/21/2016   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/21/2016   Thrombocytopenia ([place]) 06/21/2016   HLD (hyperlipidemia) 06/10/2016   Sepsis ([place]) 06/10/2016   Diabetes mellitus without complication ([place]) 38/75/6433   Fever    Lumbar spondylosis with myelopathy 08/10/2014   Left lower quadrant pain 07/14/2014   Major depression, single episode 11/30/2013   Fatigue 10/29/2011   Benign neoplasm of colon 05/16/2009   Hearing loss 05/16/2009   Malignant tumor of prostate ([place]) 05/16/2009   DIVERTICULITIS-COLON 01/08/2008   CHOLELITHIASIS 01/08/2008   PERSONAL HX COLONIC POLYPS 01/08/2008   DIABETES MELLITUS, TYPE I, CONTROLLED 12/31/2007   Essential hypertension 12/31/2007   GERD 12/31/2007   ARTHRITIS 12/31/2007   HEMORRHOIDS, INTERNAL 05/02/2006   DIVERTICULOSIS, COLON 05/02/2006   Past Surgical History:  Procedure Laterality Date   ANTERIOR CERVICAL DECOMP/DISCECTOMY FUSION N/A 03/02/2019   Procedure: ANTERIOR CERVICAL DECOMPRESSION FUSION - CERVICAL FIVE-CERVICAL SIX - CERVICAL SIX-CERVICAL SEVEN;  Surgeon: Eustace Moore, MD;  Location: [place];  Service: Neurosurgery;  Laterality: N/A;   [place] 2009   2009 - discectomy   BILIARY STENT PLACEMENT  01/14/2019   Procedure: BILIARY STENT PLACEMENT;  Surgeon: Irene Shipper, MD;  Location: [place];  Service: Endoscopy;;   CHOLECYSTECTOMY N/A 01/13/2019   Procedure: LAPAROSCOPIC SUBTOTAL CHOLECYSTECTOMY, WITH PLACEMENT OF DRAIN;  Surgeon: Ileana Roup, MD;  Location: [place];  Service: General;  Laterality: N/A;   COLONOSCOPY     ERCP N/A 01/14/2019   Procedure: ENDOSCOPIC RETROGRADE CHOLANGIOPANCREATOGRAPHY (ERCP);  Surgeon: Irene Shipper, MD;  Location[facility] ENDOSCOPY;  Service: Endoscopy;  Laterality: N/A;   ERCP N/A 02/23/2019   Procedure: ENDOSCOPIC RETROGRADE CHOLANGIOPANCREATOGRAPHY (ERCP);  Surgeon: Irene Shipper, MD;  Location: [facility];  Service: Endoscopy;  Laterality: N/A;   INGUINAL HERNIA REPAIR Bilateral 1970   bilateral    KNEE SURGERY Right 11/2018   PROSTATE SURGERY     SPHINCTEROTOMY  01/14/2019   Procedure: SPHINCTEROTOMY;  Surgeon: Irene Shipper, MD;  Location: [facility] ENDOSCOPY;  Service: Endoscopy;;   STENT REMOVAL  02/23/2019   Procedure: STENT REMOVAL;  Surgeon: Irene Shipper, MD;  Location: [facility] ENDOSCOPY;  Service: Endoscopy;;   TONSILLECTOMY  1960   Current Outpatient Medications on File Prior to Visit  Medication Sig Dispense Refill   acetaminophen (TYLENOL) 325 MG tablet take 2 tablets by mouth every 6 hours prn mild pain fever or headache     amLODipine (NORVASC) 5 MG tablet Take 5 mg by mouth daily.     aspirin 81 MG tablet Take 81 mg by mouth daily.     benazepril (LOTENSIN) 40 MG tablet Take 40 mg by mouth daily.     Carboxymethylcellul-Glycerin (LUBRICATING EYE DROPS OP) Place 1 drop into both eyes daily as needed (irritation).     cholestyramine (QUESTRAN) 4 g packet 1 packet mixed with water or non-carbonated drink     fish oil-omega-3 fatty acids 1000 MG capsule Take 1 g by mouth daily.      gabapentin (NEURONTIN) 600 MG tablet take 0.5 tablet by mouth 2 times daily     metFORMIN (GLUCOPHAGE-XR) 500 MG 24 hr tablet Take 2 tablets by mouth 2 (two) times daily.     metroNIDAZOLE (FLAGYL) 500 MG tablet Take 500 mg by mouth 3 (three) times daily.     mirtazapine (REMERON) 15 MG tablet Take 15 mg by mouth at bedtime.     omeprazole (PRILOSEC) 20 MG capsule Take 20 mg by mouth daily.     polyethylene glycol (MIRALAX / GLYCOLAX) 17 g packet Take 17 g by mouth daily as needed for mild constipation. 14 each 0   saccharomyces boulardii (FLORASTOR) 250 MG capsule Take 1 capsule (250 mg total) by mouth 2 (two) times daily. You can find a probiotic to take over the counter.     simvastatin (ZOCOR) 20 MG tablet Take 20 mg by mouth at bedtime.     sitaGLIPtin (JANUVIA) 100 MG tablet Take 100 mg by mouth daily.     trolamine salicylate (ASPERCREME) 10 % cream Apply 1 application topically as needed for muscle  pain.     No current facility-administered medications on file prior to visit.    Allergies  Allergen Reactions   Iodine Hives   Iohexol Hives        Lexapro [Escitalopram]    Social History   Occupational History   Not on file  Tobacco Use   Smoking status: Former    Types: Cigarettes    Quit date: 04/04/1959    Years since quitting: 61.8   Smokeless tobacco: Never  Vaping Use   Vaping Use: Never used  Substance and Sexual Activity   Alcohol use: No    Comment: non achololic   Drug use: No   Sexual activity: Not on file   Family History  Problem Relation Age of Onset   Lung cancer Father    Prostate cancer Brother        x 3 brothers   Colon cancer Neg Hx    Stomach cancer Neg Hx  Immunization History  Administered Date(s) Administered   PFIZER(Purple Top)SARS-COV-2 Vaccination 03/08/2019, 04/07/2019     Review of Systems: Negative except as noted in the HPI.   Objective: There were no vitals filed for this visit.  Mark Bowen is a pleasant 86 y.o. male in NAD. AAO X 3.  Vascular Examination: Palpable DP pulse(s) b/l LE. Faintly palpable PT pulse(s) b/l LE. Pedal hair sparse. No pain with calf compression b/l. No edema noted b/l LE. No cyanosis or clubbing noted b/l LE.  Dermatological Examination: Pedal integument with normal turgor, texture and tone b/l LE. No open wounds b/l. No interdigital macerations b/l. Toenails 1-5 b/l elongated, thickened, discolored with subungual debris. +Tenderness with dorsal palpation of nailplates. Porokeratotic lesion(s) noted R 2nd toe.  Musculoskeletal Examination: Normal muscle strength 5/5 to all lower extremity muscle groups bilaterally. HAV with bunion deformity noted b/l LE. Hammertoe(s) noted to the bilateral 2nd toes.. No pain, crepitus or joint limitation noted with ROM b/l LE.  Patient ambulates independently without assistive aids.  Footwear Assessment: Does the patient wear appropriate shoes? Yes. Does  the patient need inserts/orthotics? Yes.  Neurological Examination: Protective sensation intact 5/5 intact bilaterally with 10g monofilament b/l. Vibratory sensation intact b/l.  Assessment: 1. Pain due to onychomycosis of toenails of both feet   2. Porokeratosis   3. Acquired hammertoes of both feet   4. Diabetes mellitus without complication ([place])   5. Encounter for diabetic foot exam ([place])     ADA Risk Categorization: Low Risk :  Patient has all of the following: Intact protective sensation No prior foot ulcer  No severe deformity Pedal pulses present  Plan: -Diabetic foot examination performed today. -Continue foot and shoe inspections daily. Monitor blood glucose per PCP/Endocrinologist's recommendations. -Mycotic toenails 1-5 bilaterally were debrided in length and girth with sterile nail nippers and dremel without incident. -Painful porokeratotic lesion(s) R 2nd toe pared and enucleated with sterile scalpel blade without incident. Total number of lesions debrided=1. -Patient would like to discuss surgical options for painful interdigital corn right 2nd digit with Dr. Cannon Kettle. -Dispensed foam toe separator. Apply to 1st webspace right foot every morning. Remove every evening. -Patient/POA to call should there be question/concern in the interim.  Return in about 3 months (around 04/19/2021).  Marzetta Board, DPM

## 2021-01-23 DIAGNOSIS — M810 Age-related osteoporosis without current pathological fracture: Secondary | ICD-10-CM | POA: Insufficient documentation

## 2021-01-23 DIAGNOSIS — J019 Acute sinusitis, unspecified: Secondary | ICD-10-CM | POA: Insufficient documentation

## 2021-02-28 ENCOUNTER — Encounter: Payer: Self-pay | Admitting: Sports Medicine

## 2021-02-28 ENCOUNTER — Ambulatory Visit (INDEPENDENT_AMBULATORY_CARE_PROVIDER_SITE_OTHER): Payer: Medicare Other | Admitting: Sports Medicine

## 2021-02-28 DIAGNOSIS — Q828 Other specified congenital malformations of skin: Secondary | ICD-10-CM

## 2021-02-28 DIAGNOSIS — M2041 Other hammer toe(s) (acquired), right foot: Secondary | ICD-10-CM

## 2021-02-28 DIAGNOSIS — M2042 Other hammer toe(s) (acquired), left foot: Secondary | ICD-10-CM

## 2021-02-28 DIAGNOSIS — E119 Type 2 diabetes mellitus without complications: Secondary | ICD-10-CM

## 2021-02-28 NOTE — Progress Notes (Signed)
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  Patient reports that since he has been coming in for routine trimming that has been doing well and the toe cushions have helped.  Patient denies any other pedal complaints at this time.    Patient Active Problem List   Diagnosis Date Noted   Acute sinusitis 01/23/2021   Age-related osteoporosis without current pathological fracture 01/23/2021   Acute low back pain 10/25/2020   Body mass index (BMI) 25.0-25.9, adult 10/25/2020   COVID-19 09/29/2020   Hammer toe 09/29/2020   Unspecified abdominal hernia without obstruction or gangrene 07/16/2019   Amnesia 04/02/2019   S/P cervical spinal fusion 03/02/2019   Encounter for removal of biliary stent    Choledocholithiasis    Bile leak    Acute cholecystitis 01/12/2019   Cervical radiculitis 12/09/2018   Elevated blood-pressure reading, without diagnosis of hypertension 12/09/2018   Foraminal stenosis of cervical region 12/09/2018   Neck pain 11/13/2018   Chronic pain of right knee 09/05/2018   Pain in left foot 01/23/2018   Pain 11/01/2017   Acquired trigger finger 10/21/2017   Pain in right hand 10/21/2017   Tinea pedis 03/19/2017   Bite of nonvenomous arthropod 06/21/2016   Encephalitis and encephalomyelitis, unspecified 06/21/2016   Hypo-osmolality and hyponatremia 06/21/2016   Luetscher's syndrome 06/21/2016   Thrombocytopenia ([place]) 06/21/2016   HLD (hyperlipidemia) 06/10/2016   Sepsis ([place]) 06/10/2016   Diabetes mellitus without complication ([place]) 29/56/2130   Fever    Lumbar spondylosis with myelopathy 08/10/2014   Left lower quadrant pain 07/14/2014   Major depression, single episode 11/30/2013   Fatigue 10/29/2011   Benign neoplasm of colon  05/16/2009   Hearing loss 05/16/2009   Malignant tumor of prostate ([place]) 05/16/2009   DIVERTICULITIS-COLON 01/08/2008   CHOLELITHIASIS 01/08/2008   PERSONAL HX COLONIC POLYPS 01/08/2008   DIABETES MELLITUS, TYPE I, CONTROLLED 12/31/2007   Essential hypertension 12/31/2007   GERD 12/31/2007   ARTHRITIS 12/31/2007   HEMORRHOIDS, INTERNAL 05/02/2006   DIVERTICULOSIS, COLON 05/02/2006   Current Outpatient Medications on File Prior to Visit  Medication Sig Dispense Refill   azithromycin (ZITHROMAX) 250 MG tablet 2 tabs day 1, then 1 tab 4 more days     predniSONE (STERAPRED UNI-PAK 21 TAB) 5 MG (21) TBPK tablet 30mg  to 5mg  taper over 6 days     acetaminophen (TYLENOL) 325 MG tablet take 2 tablets by mouth every 6 hours prn mild pain fever or headache     amLODipine (NORVASC) 5 MG tablet Take 5 mg by mouth daily.     aspirin 81 MG tablet Take 81 mg by mouth daily.     aspirin EC 81 MG tablet SMARTSIG:1 By Mouth     benazepril (LOTENSIN) 40 MG tablet Take 40 mg by mouth daily.     Carboxymethylcellul-Glycerin (LUBRICATING EYE DROPS OP) Place 1 drop into both eyes daily as needed (irritation).     cholestyramine (QUESTRAN) 4 g packet 1 packet mixed with water or non-carbonated drink     fish oil-omega-3 fatty acids 1000 MG capsule Take 1 g by mouth daily.      fluticasone (FLONASE) 50 MCG/ACT nasal spray SPRAY 2 SPRAYS IN EACH NOSTRIL DAILY 30 DAY(S)     gabapentin (NEURONTIN) 600  MG tablet take 0.5 tablet by mouth 2 times daily     Garlic Oil 6295 MG CAPS SMARTSIG:1 By Mouth     metFORMIN (GLUCOPHAGE-XR) 500 MG 24 hr tablet Take 2 tablets by mouth 2 (two) times daily.     methocarbamol (ROBAXIN) 500 MG tablet 1 tablet     metroNIDAZOLE (FLAGYL) 500 MG tablet Take 500 mg by mouth 3 (three) times daily.     mirtazapine (REMERON) 15 MG tablet Take 15 mg by mouth at bedtime.     mupirocin ointment (BACTROBAN) 2 % Apply 1 application topically daily.     omeprazole (PRILOSEC) 20 MG capsule Take  20 mg by mouth daily.     polyethylene glycol (MIRALAX / GLYCOLAX) 17 g packet Take 17 g by mouth daily as needed for mild constipation. 14 each 0   saccharomyces boulardii (FLORASTOR) 250 MG capsule Take 1 capsule (250 mg total) by mouth 2 (two) times daily. You can find a probiotic to take over the counter.     simvastatin (ZOCOR) 20 MG tablet Take 20 mg by mouth at bedtime.     simvastatin (ZOCOR) 40 MG tablet Take by mouth.     sitaGLIPtin (JANUVIA) 100 MG tablet Take 100 mg by mouth daily.     trolamine salicylate (ASPERCREME) 10 % cream Apply 1 application topically as needed for muscle pain.     No current facility-administered medications on file prior to visit.   Allergies  Allergen Reactions   Escitalopram Oxalate     Other reaction(s): may have caused nightmares.   Iodine Hives   Iohexol Hives        Lexapro [Escitalopram]     No results found for this or any previous visit (from the past 2160 hour(s)).  Objective: General: Patient is awake, alert, and oriented x 3 and in no acute distress.  Integument: Skin is warm, dry and supple bilateral. Nails are short thickened and dystrophic with subungual debris, consistent with onychomycosis, 1-5 bilateral. No signs of infection.  Very minimal corn noted at right second toe medial aspect.  Remaining integument unremarkable.  Vasculature:  Dorsalis Pedis pulse 1 /4 bilateral. Posterior Tibial pulse  1/4 bilateral. Capillary fill time <3 sec 1-5 bilateral. Positive hair growth to the level of the digits. Temperature gradient within normal limits. No varicosities present bilateral. No edema present bilateral.   Neurology: Vibratory sensation intact bilaterally.  Musculoskeletal: Asymptomatic hammertoe and bunion pedal deformities noted bilateral. Muscular strength 5/5 in all lower extremity muscular groups bilateral without pain on range of motion    Assessment and Plan: Problem List Items Addressed This Visit       Endocrine    Diabetes mellitus without complication (HCC)   Relevant Medications   aspirin EC 81 MG tablet   simvastatin (ZOCOR) 40 MG tablet   Other Visit Diagnoses     Porokeratosis    -  Primary   Acquired hammertoes of both feet           -Examined patient. -Discussed with patient since his symptoms are improving with routine foot care we will continue with this.  There is no need for surgery at this time since symptoms are doing better with routine foot care. -At no additional charge mechanically smoothed using a rotary Dremel all of his nails patient to return for routine foot care including nail and corn trimming as scheduled in April -Dispensed new toe caps for patient to use at second toes bilateral -Answered all patient questions -  Patient to return as scheduled for diabetic foot care -Patient advised to call the office if any problems or questions arise in the meantime.  Landis Martins, DPM

## 2021-05-04 ENCOUNTER — Ambulatory Visit: Payer: Medicare Other | Admitting: Podiatry

## 2021-12-08 ENCOUNTER — Encounter: Payer: Self-pay | Admitting: Podiatry

## 2021-12-08 ENCOUNTER — Ambulatory Visit (INDEPENDENT_AMBULATORY_CARE_PROVIDER_SITE_OTHER): Payer: Medicare Other | Admitting: Podiatry

## 2021-12-08 ENCOUNTER — Ambulatory Visit (INDEPENDENT_AMBULATORY_CARE_PROVIDER_SITE_OTHER): Payer: Medicare Other

## 2021-12-08 DIAGNOSIS — M21619 Bunion of unspecified foot: Secondary | ICD-10-CM

## 2021-12-08 DIAGNOSIS — M2042 Other hammer toe(s) (acquired), left foot: Secondary | ICD-10-CM

## 2021-12-08 DIAGNOSIS — M2041 Other hammer toe(s) (acquired), right foot: Secondary | ICD-10-CM

## 2021-12-08 DIAGNOSIS — M7751 Other enthesopathy of right foot: Secondary | ICD-10-CM

## 2021-12-08 MED ORDER — TRIAMCINOLONE ACETONIDE 10 MG/ML IJ SUSP
10.0000 mg | Freq: Once | INTRAMUSCULAR | Status: AC
  Administered 2021-12-08: 10 mg

## 2021-12-08 NOTE — Progress Notes (Signed)
Subjective:   Patient ID: Mark Bowen, male   DOB: 86 y.o.   MRN: [id number]   HPI Patient presents with painful digital deformity second digit right foot within the inner phalangeal joint with structural bunion and hammertoe deformity bilateral and states he would be interested in amputation if it was possible   ROS      Objective:  Physical Exam  Neurovascular status intact with significant structural bunion deformity bilateral inflammation of the inner phalangeal joint second digit right keratotic lesion x 2 and elevated second toe right over left     Assessment:  Hammertoe deformity structural bunion inflammatory capsulitis     Plan:  H&P reviewed x-rays would like to avoid surgery on this patient as best as possible went ahead did sterile prep injected the inner phalangeal joint 2 mg dexamethasone Kenalog 5 mg Xylocaine discussed bunion hammertoe do not recommend current surgery but may be necessary if we can get symptoms under control.  Debrided padded reappoint  X-rays indicate structural bunion deformity bilateral with digital deformity right over left foot with hammertoe bunion deformity noted

## 2021-12-21 ENCOUNTER — Ambulatory Visit: Payer: Medicare Other | Admitting: Podiatry

## 2023-08-05 ENCOUNTER — Ambulatory Visit (INDEPENDENT_AMBULATORY_CARE_PROVIDER_SITE_OTHER): Admitting: Podiatry

## 2023-08-05 ENCOUNTER — Encounter: Payer: Self-pay | Admitting: Podiatry

## 2023-08-05 DIAGNOSIS — L84 Corns and callosities: Secondary | ICD-10-CM

## 2023-08-05 DIAGNOSIS — M79675 Pain in left toe(s): Secondary | ICD-10-CM

## 2023-08-05 DIAGNOSIS — M79674 Pain in right toe(s): Secondary | ICD-10-CM

## 2023-08-05 DIAGNOSIS — B351 Tinea unguium: Secondary | ICD-10-CM | POA: Diagnosis not present

## 2023-08-05 DIAGNOSIS — E119 Type 2 diabetes mellitus without complications: Secondary | ICD-10-CM | POA: Diagnosis not present

## 2023-08-05 NOTE — Progress Notes (Signed)
 Chief Complaint  Patient presents with   [facility]    [facility]  Last A1c is unknown, takes ASA occasionally.     HPI: 88 y.o. male presents today for diabetic footcare.  Is been sometime since he has been seen in our office.  Does have some painful calluses as well mostly right second toe interdigital aspect.  Unsure of his last A1c but states that it has been under pretty good shape per his PCP.  Past Medical History:  Diagnosis Date   Acute cholecystitis 01/2019   Arthritis    Diabetes mellitus 2007   type 2   Diverticulitis    GERD (gastroesophageal reflux disease)    02/27/2019- not currently   Hypercholesterolemia since 2008 preventative   taking Zocor , now cholesterol WNL   Hypertension since 1970   Prostate CA (HCC)    Shingles     Past Surgical History:  Procedure Laterality Date   ANTERIOR CERVICAL DECOMP/DISCECTOMY FUSION N/A 03/02/2019   Procedure: ANTERIOR CERVICAL DECOMPRESSION FUSION - CERVICAL FIVE-CERVICAL SIX - CERVICAL SIX-CERVICAL SEVEN;  Surgeon: Joshua Alm RAMAN, MD;  Location: [facility] OR;  Service: Neurosurgery;  Laterality: N/A;   APPENDECTOMY  1960   BACK SURGERY  1960 & 2009   2009 - discectomy   BILIARY STENT PLACEMENT  01/14/2019   Procedure: BILIARY STENT PLACEMENT;  Surgeon: Abran Norleen SAILOR, MD;  Location: [facility] ENDOSCOPY;  Service: Endoscopy;;   CHOLECYSTECTOMY N/A 01/13/2019   Procedure: LAPAROSCOPIC SUBTOTAL CHOLECYSTECTOMY, WITH PLACEMENT OF DRAIN;  Surgeon: Teresa Lonni HERO, MD;  Location: [facility] OR;  Service: General;  Laterality: N/A;   COLONOSCOPY     ERCP N/A 01/14/2019   Procedure: ENDOSCOPIC RETROGRADE CHOLANGIOPANCREATOGRAPHY (ERCP);  Surgeon: Abran Norleen SAILOR, MD;  Location: [facility] ENDOSCOPY;  Service: Endoscopy;  Laterality: N/A;   ERCP N/A 02/23/2019   Procedure: ENDOSCOPIC RETROGRADE CHOLANGIOPANCREATOGRAPHY (ERCP);  Surgeon: Abran Norleen SAILOR, MD;  Location: [facility] ENDOSCOPY;  Service: Endoscopy;  Laterality: N/A;   INGUINAL HERNIA REPAIR Bilateral 1970   bilateral    KNEE SURGERY Right 11/2018   PROSTATE SURGERY     SPHINCTEROTOMY  01/14/2019   Procedure: SPHINCTEROTOMY;  Surgeon: Abran Norleen SAILOR, MD;  Location: [facility] ENDOSCOPY;  Service: Endoscopy;;   STENT REMOVAL  02/23/2019   Procedure: STENT REMOVAL;  Surgeon: Abran Norleen SAILOR, MD;  Location: [facility] ENDOSCOPY;  Service: Endoscopy;;   TONSILLECTOMY  1960    Allergies  Allergen Reactions   Escitalopram Oxalate     Other reaction(s): may have caused nightmares.   Iodine Hives   Iohexol  Hives        Lexapro [Escitalopram]     ROS    Physical Exam: There were no vitals filed for this visit.  General: The patient is alert and oriented x3 in no acute distress.  Dermatology: Pedal skin is thin and atrophic.  Decreased pedal hair growth. Nail plates x 10 bilaterally are thickened, elongated, dystrophic with yellow discoloration and subungual debris.  They are tender on direct dorsal palpation. Painful interdigital callus present right second toe PIPJ medial aspect  Vascular: DP and PT pulses palpable 1/4 bilaterally.  Capillary refill 3 seconds to the digits.  No appreciable edema.  Neurological: Light touch sensation grossly intact bilateral feet.  Protective sensation intact.  Musculoskeletal Exam: Muscle strength 5/5 in dorsiflexion, plantarflexion, inversion, eversion.  Moderate bunion deformities bilaterally.  Hammertoe contractures of lesser digits.   Assessment/Plan of Care: 1. Pain due to onychomycosis of toenails of both feet  2. Diabetes mellitus without complication (HCC)   3. Callus between toes      No orders of the defined types were placed in this encounter.  None  Discussed clinical findings with patient today.  # Right second toe callus medial aspect of PIPJ -Preulcerative callus x 1 was safely debrided with a sterile #15 blade to patient's level of comfort without incident. We discussed preventative and palliative care of these lesions including supportive and accommodative  shoegear, padding, prefabricated and custom molded accommodative orthoses, use of a pumice stone and lotions/creams daily. - High risk footcare due to diabetes with diminished pulses and trophic changes  #Onychomycosis with pain  -Nails palliatively debrided as below. -Educated on self-care  Procedure: Nail Debridement Rationale: Pain Type of Debridement: manual, sharp debridement. Instrumentation: Nail nipper, rotary burr. Number of Nails: 10  # Diabetes, decreased pulses Patient educated on diabetes. Discussed proper diabetic foot care and discussed risks and complications of disease. Educated patient in depth on reasons to return to the office immediately should he/she discover anything concerning or new on the feet. All questions answered. Discussed proper shoes as well.   Follow-up in 3 months for diabetic footcare or sooner if new pedal complaints arise  Mark Bowen L. Lamount MAUL, AACFAS [facility]     [address] [place], [place] [address]                Office [phone]  Fax [phone]

## 2023-11-04 ENCOUNTER — Ambulatory Visit (INDEPENDENT_AMBULATORY_CARE_PROVIDER_SITE_OTHER): Admitting: Podiatry

## 2023-11-04 ENCOUNTER — Encounter: Payer: Self-pay | Admitting: Podiatry

## 2023-11-04 DIAGNOSIS — M79675 Pain in left toe(s): Secondary | ICD-10-CM

## 2023-11-04 DIAGNOSIS — M79674 Pain in right toe(s): Secondary | ICD-10-CM

## 2023-11-04 DIAGNOSIS — E119 Type 2 diabetes mellitus without complications: Secondary | ICD-10-CM

## 2023-11-04 DIAGNOSIS — B351 Tinea unguium: Secondary | ICD-10-CM

## 2023-11-04 NOTE — Progress Notes (Unsigned)
 Chief Complaint  Patient presents with   [facility]    [facility] with callous A1c a little over 6 in Aug ASA    HPI: 88 y.o. male presents today for diabetic footcare.  History confirmed with patient.  He reports that A1c is in the sixes range.  Presents with painful, thickened, dystrophic mycotic toenails that the patient is unable to maintain himself due to their dystrophic nature and due to mobility issues.  They do cause pain with shoe gear and direct pressure.  No significant recurrence of the previous interdigital callus at this point.  Past Medical History:  Diagnosis Date   Acute cholecystitis 01/2019   Arthritis    Diabetes mellitus 2007   type 2   Diverticulitis    GERD (gastroesophageal reflux disease)    02/27/2019- not currently   Hypercholesterolemia since 2008 preventative   taking Zocor , now cholesterol WNL   Hypertension since 1970   Prostate CA (HCC)    Shingles     Past Surgical History:  Procedure Laterality Date   ANTERIOR CERVICAL DECOMP/DISCECTOMY FUSION N/A 03/02/2019   Procedure: ANTERIOR CERVICAL DECOMPRESSION FUSION - CERVICAL FIVE-CERVICAL SIX - CERVICAL SIX-CERVICAL SEVEN;  Surgeon: Joshua Alm RAMAN, MD;  Location: [facility] OR;  Service: Neurosurgery;  Laterality: N/A;   APPENDECTOMY  1960   BACK SURGERY  1960 & 2009   2009 - discectomy   BILIARY STENT PLACEMENT  01/14/2019   Procedure: BILIARY STENT PLACEMENT;  Surgeon: Abran Norleen SAILOR, MD;  Location: [facility] ENDOSCOPY;  Service: Endoscopy;;   CHOLECYSTECTOMY N/A 01/13/2019   Procedure: LAPAROSCOPIC SUBTOTAL CHOLECYSTECTOMY, WITH PLACEMENT OF DRAIN;  Surgeon: Teresa Lonni HERO, MD;  Location: [facility] OR;  Service: General;  Laterality: N/A;   COLONOSCOPY     ERCP N/A 01/14/2019   Procedure: ENDOSCOPIC RETROGRADE CHOLANGIOPANCREATOGRAPHY (ERCP);  Surgeon: Abran Norleen SAILOR, MD;  Location: [facility] ENDOSCOPY;  Service: Endoscopy;  Laterality: N/A;   ERCP N/A 02/23/2019   Procedure: ENDOSCOPIC RETROGRADE CHOLANGIOPANCREATOGRAPHY (ERCP);   Surgeon: Abran Norleen SAILOR, MD;  Location: [facility]OSCOPY;  Service: Endoscopy;  Laterality: N/A;   INGUINAL HERNIA REPAIR Bilateral 1970   bilateral   KNEE SURGERY Right 11/2018   PROSTATE SURGERY     SPHINCTEROTOMY  01/14/2019   Procedure: SPHINCTEROTOMY;  Surgeon: Abran Norleen SAILOR, MD;  Location: [facility] ENDOSCOPY;  Service: Endoscopy;;   STENT REMOVAL  02/23/2019   Procedure: STENT REMOVAL;  Surgeon: Abran Norleen SAILOR, MD;  Location: [facility] ENDOSCOPY;  Service: Endoscopy;;   TONSILLECTOMY  1960    Allergies  Allergen Reactions   Escitalopram Oxalate     Other reaction(s): may have caused nightmares.   Iodine Hives   Iohexol  Hives        Lexapro [Escitalopram]     ROS    Physical Exam: There were no vitals filed for this visit.  General: The patient is alert and oriented x3 in no acute distress.  Dermatology: Pedal skin is thin and atrophic.  Decreased pedal hair growth. Nail plates x 10 bilaterally are thickened, elongated, dystrophic with yellow discoloration and subungual debris.  They are tender on direct dorsal palpation. Prior interdigital callus right second toe has not recurred at this point.  Vascular: DP and PT pulses palpable 1/4 bilaterally.  Capillary refill 3 seconds to the digits.  No appreciable edema.  Neurological: Light touch sensation grossly intact bilateral feet.  Protective sensation intact.  Musculoskeletal Exam: Muscle strength 5/5 in dorsiflexion, plantarflexion, inversion, eversion.  Moderate bunion deformities bilaterally.  Hammertoe contractures of lesser digits.   Assessment/Plan of Care: 1. Pain due to onychomycosis of toenails of both feet   2. Diabetes mellitus without complication (HCC)      No orders of the defined types were placed in this encounter.  None  Discussed clinical findings with patient today.  #Onychomycosis with pain  -Nails palliatively debrided as below. -Educated on self-care  Procedure: Nail Debridement Rationale: Pain Type of  Debridement: manual, sharp debridement. Instrumentation: Nail nipper, rotary burr. Number of Nails: 10   # Diabetes, decreased pulses Patient educated on diabetes. Discussed proper diabetic foot care and discussed risks and complications of disease. Educated patient in depth on reasons to return to the office immediately should he/she discover anything concerning or new on the feet. All questions answered. Discussed proper shoes as well.    Follow-up in 3 months for diabetic footcare or sooner if new pedal complaints arise  Tauheed Mcfayden L. Lamount MAUL, AACFAS [facility]     [address] [place], [place] [address]                Office [phone]  Fax [phone]

## 2023-11-12 ENCOUNTER — Encounter (HOSPITAL_COMMUNITY): Payer: Self-pay | Admitting: Registered Nurse

## 2023-11-12 ENCOUNTER — Ambulatory Visit (HOSPITAL_COMMUNITY)
Admission: RE | Admit: 2023-11-12 | Discharge: 2023-11-12 | Disposition: A | Discharge status: Home / Self Care | Source: Ambulatory Visit | Attending: Registered Nurse | Admitting: Registered Nurse

## 2023-11-12 ENCOUNTER — Other Ambulatory Visit (HOSPITAL_COMMUNITY): Payer: Self-pay | Admitting: Registered Nurse

## 2023-11-12 DIAGNOSIS — R630 Anorexia: Secondary | ICD-10-CM

## 2023-11-12 DIAGNOSIS — R1032 Left lower quadrant pain: Secondary | ICD-10-CM | POA: Insufficient documentation

## 2023-11-12 DIAGNOSIS — K5730 Diverticulosis of large intestine without perforation or abscess without bleeding: Secondary | ICD-10-CM

## 2023-11-12 DIAGNOSIS — R197 Diarrhea, unspecified: Secondary | ICD-10-CM | POA: Insufficient documentation

## 2023-11-12 DIAGNOSIS — K30 Functional dyspepsia: Secondary | ICD-10-CM | POA: Insufficient documentation

## 2023-11-12 MED ORDER — BARIUM SULFATE 2 % PO SUSP
900.0000 mL | Freq: Once | ORAL | Status: AC
  Administered 2023-11-12: 900 mL via ORAL

## 2023-11-18 ENCOUNTER — Telehealth: Payer: Self-pay | Admitting: Internal Medicine

## 2023-11-18 NOTE — Telephone Encounter (Signed)
 Patient daughter called stating patient has been having severe abdominal pain. States he recently had a CT scan but it came back clear. Patient's daughter requesting to speak with nurse further. Please advise, thank you

## 2023-11-18 NOTE — Telephone Encounter (Signed)
 Left message for pt to call back.  Pts daughter called back and states her dad has been referred for abd pain, ct was done and showed nothing. She states his abd is tender and even after being on bland foods the pain isnot better. Pt scheduled to see Alan Barr PA Wednesday at 8:40am, she is aware of appt.

## 2023-11-19 NOTE — Progress Notes (Unsigned)
 11/20/2023 Mark Bowen [id number] Nov 29, 1935  Referring provider: Shayne Anes, MD Primary GI doctor: Dr. 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 male with a past medical history listed below presents for  evaluation of AB pain/diarrhea.   Patient last in the office 2021 Dr. Abran for bile leak.  Discussed the use of AI scribe software for clinical note transcription with the patient, who gave verbal consent to proceed.  History of Present Illness   Mark Bowen is an 88 year old male with diverticulitis who presents with abdominal pain.  He has been experiencing abdominal pain for the past two to three weeks. The pain initially started in the left lower quadrant and has since moved to the center of the lower abdomen, just above the pelvis. It is described as aching and sometimes cramp-like, lasting all day and sometimes sore to touch. The pain does not radiate to the back or higher into the abdomen. It is not consistently triggered by food, bowel movements, or physical activity, although he did experience pain shortly after eating a meal recently, which was unusual for him. No recent weight loss, fever, or chills. He has dark brown stools but no recent black or bloody stools. A CT scan on November 4th showed diverticulosis without active inflammation. His last colonoscopy was in 2013.  He has been experiencing diarrhea for the past three to four years, which he attributes to taking a high dose of ibuprofen  (four tablets daily, two in the morning and two at night). The diarrhea worsened when he was on metformin  but has improved since discontinuing it. He typically has one to two bowel movements per day, sometimes three, and the diarrhea does not wake him at night. He takes Questran powder to manage diarrhea, which he finds difficult to remember to take. He had his gallbladder removed in 2021, which coincides with the onset  of his diarrhea.  He reports urinary incontinence, requiring him to wear a pad, but denies any burning or pain with urination. He has a family history of prostate cancer, with all his brothers and father having had the condition. He himself had his prostate removed due to cancer.         He  reports that he quit smoking about 64 years ago. His smoking use included cigarettes. He has never used smokeless tobacco. He reports that he does not drink alcohol and does not use drugs.  RELEVANT GI HISTORY, IMAGING AND LABS: Results   RADIOLOGY Abdominal CT: Diverticulosis without active inflammation (11/12/2023)      CBC    Component Value Date/Time   WBC 9.0 03/25/2019 1550   RBC 5.14 03/25/2019 1550   HGB 14.7 03/25/2019 1550   HCT 44.5 03/25/2019 1550   PLT 217 03/25/2019 1550   MCV 86.6 03/25/2019 1550   MCH 28.6 03/25/2019 1550   MCHC 33.0 03/25/2019 1550   RDW 13.4 03/25/2019 1550   LYMPHSABS 2.0 03/25/2019 1550   MONOABS 0.6 03/25/2019 1550   EOSABS 0.2 03/25/2019 1550   BASOSABS 0.1 03/25/2019 1550   No results for input(s): HGB in the last 8760 hours.  CMP     Component Value Date/Time   NA 140 03/25/2019 1550   K 4.5 03/25/2019 1550   CL 102 03/25/2019 1550   CO2 28 03/25/2019 1550   GLUCOSE 120 (H) 03/25/2019 1550   BUN 14 03/25/2019 1550   CREATININE 0.89 03/25/2019 1550   CALCIUM 9.8 03/25/2019 1550   PROT 7.3 03/25/2019 1550   ALBUMIN 4.4 03/25/2019 1550   AST 18 03/25/2019 1550   ALT 17 03/25/2019 1550   ALKPHOS 137 (H) 03/25/2019 1550   BILITOT 0.7 03/25/2019 1550   GFRNONAA >60 03/25/2019 1550   GFRAA >60 03/25/2019 1550      Latest Ref Rng & Units 03/25/2019    3:50 PM 01/16/2019    3:01 AM 01/15/2019    3:03 AM  Hepatic Function  Total Protein 6.5 - 8.1 g/dL 7.3  5.1  4.8   Albumin 3.5 - 5.0 g/dL 4.4  2.5  2.4   AST 15 - 41 U/L 18  20  23    ALT 0 - 44 U/L 17  37  38   Alk Phosphatase 38 - 126 U/L 137  60  51   Total Bilirubin 0.3 - 1.2 mg/dL 0.7  0.7  0.3       Current Medications:   Current Outpatient Medications (Endocrine & Metabolic):    metFORMIN  (GLUCOPHAGE -XR) 500 MG 24 hr tablet, Take 2 tablets by mouth 2 (two) times daily. (Patient not taking: Reported on 11/20/2023)   predniSONE (STERAPRED UNI-PAK 21 TAB) 5  MG (21) TBPK tablet, 30mg  to 5mg  taper over 6 days   sitaGLIPtin (JANUVIA) 100 MG tablet, Take 100 mg by mouth daily.  Current Outpatient Medications (Cardiovascular):    colestipol (COLESTID) 1 g tablet, Take 2 tablets (2 g total) by mouth 2 (two) times daily.   amLODipine (NORVASC) 5 MG tablet, Take 5 mg by mouth daily.   benazepril  (LOTENSIN ) 40 MG tablet, Take 40 mg by mouth daily.   simvastatin  (ZOCOR ) 20 MG tablet, Take 20 mg by mouth at bedtime.   simvastatin  (ZOCOR ) 40 MG tablet, Take by mouth.  Current Outpatient Medications (Respiratory):    fluticasone (FLONASE) 50 MCG/ACT nasal spray, SPRAY 2 SPRAYS IN EACH NOSTRIL DAILY 30 DAY(S)  Current Outpatient Medications (  Analgesics):    acetaminophen  (TYLENOL ) 325 MG tablet, take 2 tablets by mouth every 6 hours prn mild pain fever or headache   aspirin  81 MG tablet, Take 81 mg by mouth daily.   aspirin  EC 81 MG tablet, SMARTSIG:1 By Mouth   Current Outpatient Medications (Other):    dicyclomine (BENTYL) 10 MG capsule, Take 1 capsule (10 mg total) by mouth 3 (three) times daily as needed for spasms (for AB cramping).   azithromycin  (ZITHROMAX ) 250 MG tablet, 2 tabs day 1, then 1 tab 4 more days   Carboxymethylcellul-Glycerin (LUBRICATING EYE DROPS OP), Place 1 drop into both eyes daily as needed (irritation).   fish oil-omega-3 fatty acids  1000 MG capsule, Take 1 g by mouth daily.    gabapentin  (NEURONTIN ) 600 MG tablet, take 0.5 tablet by mouth 2 times daily   Garlic Oil 1000 MG CAPS, SMARTSIG:1 By Mouth   methocarbamol  (ROBAXIN ) 500 MG tablet, 1 tablet   metroNIDAZOLE  (FLAGYL ) 500 MG tablet, Take 1 tablet (500 mg total) by mouth 3 (three) times daily for 14 days.   mirtazapine (REMERON) 15 MG tablet, Take 15 mg by mouth at bedtime.   mupirocin ointment (BACTROBAN) 2 %, Apply 1 application topically daily.   omeprazole (PRILOSEC) 20 MG capsule, Take 20 mg by mouth daily.   polyethylene glycol (MIRALAX  / GLYCOLAX ) 17 g packet, Take  17 g by mouth daily as needed for mild constipation.   saccharomyces boulardii (FLORASTOR) 250 MG capsule, Take 1 capsule (250 mg total) by mouth 2 (two) times daily. You can find a probiotic to take over the counter.   trolamine salicylate (ASPERCREME) 10 % cream, Apply 1 application topically as needed for muscle pain.  Medical History:  Past Medical History:  Diagnosis Date   Acute cholecystitis 01/2019   Arthritis    Diabetes mellitus 2007   type 2   Diverticulitis    GERD (gastroesophageal reflux disease)    02/27/2019- not currently   Hypercholesterolemia since 2008 preventative   taking Zocor , now cholesterol WNL   Hypertension since 1970   Prostate CA (HCC)    Shingles    Allergies:  Allergies  Allergen Reactions   Escitalopram Oxalate     Other reaction(s): may have caused nightmares.   Iodine Hives   Iohexol  Hives        Lexapro [Escitalopram]      Surgical History:  He  has a past surgical history that includes Tonsillectomy (1960); Inguinal hernia repair (Bilateral, 1970); Appendectomy (1960); Back surgery (1960 & 2009); Prostate surgery; Knee surgery (Right, 11/2018); Cholecystectomy (N/A, 01/13/2019); ERCP (N/A, 01/14/2019); sphincterotomy (01/14/2019); biliary stent placement (01/14/2019); ERCP (N/A, 02/23/2019); Stent removal (02/23/2019); Colonoscopy; and Anterior cervical decomp/discectomy fusion (N/A, 03/02/2019). Family History:  His family history includes Lung cancer in his father; Prostate cancer in his brother.  REVIEW OF SYSTEMS  : All other systems reviewed and negative except where noted in the History of Present Illness.  PHYSICAL EXAM: BP 120/68   Pulse 76   Ht 5' 8 (1.727 m)   Wt 167 lb (75.8 kg)   BMI 25.39 kg/m  Physical Exam   GENERAL APPEARANCE: Well nourished, in no apparent distress. HEENT: No cervical lymphadenopathy, unremarkable thyroid, sclerae anicteric, conjunctiva pink. RESPIRATORY: Respiratory effort normal, breath sounds equal  bilaterally without rales, rhonchi, or wheezing. CARDIO: Regular rate and rhythm with no murmurs, rubs, or gallops, peripheral pulses intact. ABDOMEN: Soft, non-distended, active bowel sounds in all four quadrants, mild tenderness to palpation, no rebound tenderness,  no mass appreciated. RECTAL: Declines. MUSCULOSKELETAL: Full range of motion, normal gait, without edema. SKIN: Dry, intact without rashes or lesions. No jaundice. NEURO: Alert, oriented, no focal deficits. PSYCH: Cooperative, normal mood and affect.      Alan JONELLE Coombs, PA-C 10:40 AM

## 2023-11-20 ENCOUNTER — Ambulatory Visit: Payer: Self-pay | Admitting: Physician Assistant

## 2023-11-20 ENCOUNTER — Encounter: Payer: Self-pay | Admitting: Physician Assistant

## 2023-11-20 ENCOUNTER — Other Ambulatory Visit (INDEPENDENT_AMBULATORY_CARE_PROVIDER_SITE_OTHER)

## 2023-11-20 ENCOUNTER — Ambulatory Visit (INDEPENDENT_AMBULATORY_CARE_PROVIDER_SITE_OTHER): Admitting: Physician Assistant

## 2023-11-20 VITALS — BP 120/68 | HR 76 | Ht 68.0 in | Wt 167.0 lb

## 2023-11-20 DIAGNOSIS — R1030 Lower abdominal pain, unspecified: Secondary | ICD-10-CM

## 2023-11-20 DIAGNOSIS — K219 Gastro-esophageal reflux disease without esophagitis: Secondary | ICD-10-CM

## 2023-11-20 DIAGNOSIS — R197 Diarrhea, unspecified: Secondary | ICD-10-CM | POA: Diagnosis not present

## 2023-11-20 DIAGNOSIS — R32 Unspecified urinary incontinence: Secondary | ICD-10-CM | POA: Diagnosis not present

## 2023-11-20 LAB — CBC WITH DIFFERENTIAL/PLATELET
Basophils Absolute: 0 K/uL (ref 0.0–0.1)
Basophils Relative: 0.6 % (ref 0.0–3.0)
Eosinophils Absolute: 0.1 K/uL (ref 0.0–0.7)
Eosinophils Relative: 1.6 % (ref 0.0–5.0)
HCT: 42.4 % (ref 39.0–52.0)
Hemoglobin: 14.6 g/dL (ref 13.0–17.0)
Lymphocytes Relative: 24.9 % (ref 12.0–46.0)
Lymphs Abs: 1.3 K/uL (ref 0.7–4.0)
MCHC: 34.5 g/dL (ref 30.0–36.0)
MCV: 87.2 fl (ref 78.0–100.0)
Monocytes Absolute: 0.4 K/uL (ref 0.1–1.0)
Monocytes Relative: 8.5 % (ref 3.0–12.0)
Neutro Abs: 3.4 K/uL (ref 1.4–7.7)
Neutrophils Relative %: 64.4 % (ref 43.0–77.0)
Platelets: 223 K/uL (ref 150.0–400.0)
RBC: 4.87 Mil/uL (ref 4.22–5.81)
RDW: 13.5 % (ref 11.5–15.5)
WBC: 5.2 K/uL (ref 4.0–10.5)

## 2023-11-20 LAB — COMPREHENSIVE METABOLIC PANEL WITH GFR
ALT: 15 U/L (ref 0–53)
AST: 14 U/L (ref 0–37)
Albumin: 4.1 g/dL (ref 3.5–5.2)
Alkaline Phosphatase: 68 U/L (ref 39–117)
BUN: 11 mg/dL (ref 6–23)
CO2: 28 meq/L (ref 19–32)
Calcium: 9.1 mg/dL (ref 8.4–10.5)
Chloride: 104 meq/L (ref 96–112)
Creatinine, Ser: 0.97 mg/dL (ref 0.40–1.50)
GFR: 70.91 mL/min (ref >60.00)
Glucose, Bld: 140 mg/dL — ABNORMAL HIGH (ref 70–99)
Potassium: 4.2 meq/L (ref 3.5–5.1)
Sodium: 139 meq/L (ref 135–145)
Total Bilirubin: 0.9 mg/dL (ref 0.2–1.2)
Total Protein: 6.3 g/dL (ref 6.0–8.3)

## 2023-11-20 LAB — SEDIMENTATION RATE: Sed Rate: 3 mm/h (ref 0–20)

## 2023-11-20 MED ORDER — COLESTIPOL HCL 1 G PO TABS
2.0000 g | ORAL_TABLET | Freq: Two times a day (BID) | ORAL | 0 refills | Status: AC
Start: 2023-11-20 — End: ?

## 2023-11-20 MED ORDER — DICYCLOMINE HCL 10 MG PO CAPS: 10.0000 mg | ORAL_CAPSULE | Freq: Three times a day (TID) | ORAL | 0 refills | Status: AC | PRN

## 2023-11-20 MED ORDER — METRONIDAZOLE 500 MG PO TABS: 500.0000 mg | ORAL_TABLET | Freq: Three times a day (TID) | ORAL | 0 refills | Status: AC

## 2023-11-20 NOTE — Patient Instructions (Addendum)
 Your provider has requested that you go to the basement level for lab work before leaving today. Press B on the elevator. The lab is located at the first door on the left as you exit the elevator.  Due to recent changes in healthcare laws, you may see the results of your imaging and laboratory studies on MyChart before your provider has had a chance to review them.  We understand that in some cases there may be results that are confusing or concerning to you. Not all laboratory results come back in the same time frame and the provider may be waiting for multiple results in order to interpret others.  Please give us  48 hours in order for your provider to thoroughly review all the results before contacting the office for clarification of your results.   We are referring you to Pelvic Floor Physical Therapy.  They will contact you directly to schedule an appointment.  It may take a week or more before you hear from them.  Please feel free to contact us  if you have not heard from them within 2 weeks and we will follow up on the referral.    VISIT SUMMARY:  You visited us  today due to abdominal pain that has been ongoing for the past two to three weeks. We discussed your chronic diarrhea, possible segmental colitis associated with diverticulosis (SCAD), and your stable gastroesophageal reflux disease (GERD). We also addressed your urinary incontinence and provided a referral for pelvic floor therapy.  YOUR PLAN:  LOWER ABDOMINAL PAIN: Chronic lower abdominal pain, likely due to irritable bowel syndrome (IBS) or segmental colitis associated with diverticulosis (SCAD). -You have been prescribed a low dose antispasmodic to take up to three times daily. -We provided you with information on managing IBS.  CHRONIC DIARRHEA AFTER CHOLECYSTECTOMY (BILE ACID DIARRHEA): Chronic diarrhea likely due to bile acid diarrhea after gallbladder removal. -We switched your medication to colestipol pills for easier  use. -Start with one pill in the morning, and you can increase to two if needed, with a maximum of four pills per day. -Monitor your bowel habits and adjust the dosage as necessary.  SEGMENTAL COLITIS ASSOCIATED WITH DIVERTICULOSIS (SCAD): Possible SCAD due to diverticulosis, with no alarm symptoms. -You have been prescribed Flagyl  for 10-14 days. -Take Flagyl  with food and avoid alcohol. -Monitor for symptom improvement within a week.  GASTROESOPHAGEAL REFLUX DISEASE (GERD): Your GERD is stable. -Continue taking omeprazole 20 mg daily.  URINARY INCONTINENCE: Chronic urinary incontinence. -You have been referred to pelvic floor physical therapy.  First do a trial off milk/lactose products if you use them.  Add fiber like benefiber or citracel once a day Can send in an anti spasm medication, Bentyl, to take as needed Please try to decrease stress. consider talking with PCP about anti anxiety medication or try head space app for meditation. if any worsening symptoms like blood in stool, weight loss, please call the office    Thank you for trusting me with your gastrointestinal care!   Alan Coombs, PA-C   _______________________________________________________  If your blood pressure at your visit was 140/90 or greater, please contact your primary care physician to follow up on this.  _______________________________________________________  If you are age 51 or older, your body mass index should be between 23-30. Your Body mass index is 25.39 kg/m. If this is out of the aforementioned range listed, please consider follow up with your Primary Care Provider.  If you are age 64 or younger, your body mass index  should be between 19-25. Your Body mass index is 25.39 kg/m. If this is out of the aformentioned range listed, please consider follow up with your Primary Care Provider.   ________________________________________________________  The [facility] providers would like to  encourage you to use MYCHART to communicate with providers for non-urgent requests or questions.  Due to long hold times on the telephone, sending your provider a message by [facility] may be a faster and more efficient way to get a response.  Please allow 48 business hours for a response.  Please remember that this is for non-urgent requests.  _______________________________________________________  [facility] is using a team-based approach to care.  Your team is made up of your doctor and two to three APPS. Our APPS (Nurse Practitioners and Physician Assistants) work with your physician to ensure care continuity for you. They are fully qualified to address your health concerns and develop a treatment plan. They communicate directly with your gastroenterologist to care for you. Seeing the Advanced Practice Practitioners on your physician's team can help you by facilitating care more promptly, often allowing for earlier appointments, access to diagnostic testing, procedures, and other specialty referrals.       FODMAP stands for fermentable oligo-, di-, mono-saccharides and polyols (1). These are the scientific terms used to classify groups of carbs that are difficult for our body to digest and that are notorious for triggering digestive symptoms like bloating, gas, loose stools and stomach pain.   You can try low FODMAP diet  - start with eliminating just one column at a time that you feel may be a trigger for you. - the table at the very bottom contains foods that are low in FODMAPs   Sometimes trying to eliminate the FODMAP's from your diet is difficult or tricky, if you are stuggling with trying to do the elimination diet you can try an enzyme.  There is a food enzymes that you sprinkle in or on your food that helps break down the FODMAP. You can read more about the enzyme by going to this site: https://fodzyme.com/

## 2023-11-20 NOTE — Progress Notes (Signed)
 Noted

## 2023-12-27 ENCOUNTER — Other Ambulatory Visit: Payer: Self-pay | Admitting: Physician Assistant

## 2023-12-31 NOTE — Progress Notes (Signed)
 "    01/06/2024 Mark Bowen [id number] 10-13-1935  Referring provider: Shayne Anes, MD Primary GI doctor: Dr. 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 diverticulosis throughout no neoplasia recall 5 years 11/12/2023 CTAP W0 moderate to severe diverticulosis without active inflammation, stable left renal cyst aortic atherosclerosis No alarm symptoms 11/20/2023 CBC without anemia, sed rate normal, given trial of Flagyl  ( no help) and Bentyl  ( no help, still having spasms) Likely MSK versus SCAD, pelvic floor - try salon pas patches -FODMAP,  and lifestyle changes discussed - consider colonoscopy if any worsening symptoms  Diarrhea improved stopping metformin  Stopping metformin , now having 1 BM daily and formed no nocturnal symptoms, no fever, chills. 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 male with a past medical history listed below presents for evaluation of AB pain/diarrhea.   I last saw the patient in the office 11/20/2023 for abdominal pain  and diarrhea.  Patient had recent unremarkable CT other than diverticulosis, normal labs including sed rate CRP, did trial of Flagyl  and Bentyl .  Switch Questran to colestipol   Discussed the use of AI scribe software for clinical note transcription with the patient, who gave verbal consent to proceed.  History of Present Illness   Mark Bowen is an 88 year old male with type 2 diabetes mellitus and prior chronic diarrhea who presents for follow-up of gastrointestinal symptoms.  The patient reports that his chronic loose stools improved after discontinuing metformin . Previous treatments included a 10-day course of Flagyl , dicyclomine , and switching from Questran to colestipol , but he is not currently taking either bile acid sequestrant. Bowel movements are now regular and formed, typically once daily, with no recurrence of diarrhea, no dark or bloody stools, and no associated fevers or chills.  He continues to experience intermittent abdominal growling and weird roar noises, which have improved but persist. Reports a brief sensation of muscle trembling in the right lower abdomen, lasting a few seconds, not associated with significant pain. A recent episode of sneezing precipitated a brief, severe stomach cramp, after which the abdominal trembling began. The sensation is not affected by position, movement, eating, or bowel movements, and does not occur daily. No pain radiating to the hip or leg. Denies ongoing abdominal pain, weight loss, nausea, vomiting, or dysphagia.  He is not currently taking metformin . Morning fasting blood glucose readings are elevated, around 175-177 mg/dL. He is taking extended-release Januvia for diabetes and is not on any other diabetes medications to his knowledge. Experiences chills and gets cold easily when outside. Denies shortness of breath or chest pain.        He  reports that he quit smoking  about 64 years ago. His smoking use included cigarettes. He has never  used smokeless tobacco. He reports that he does not drink alcohol and does not use drugs.  RELEVANT GI HISTORY, IMAGING AND LABS: Results   Radiology Abdominal CT: No hernia. No abnormal findings.      CBC    Component Value Date/Time   WBC 5.2 11/20/2023 0901   RBC 4.87 11/20/2023 0901   HGB 14.6 11/20/2023 0901   HCT 42.4 11/20/2023 0901   PLT 223.0 11/20/2023 0901   MCV 87.2 11/20/2023 0901   MCH 28.6 03/25/2019 1550   MCHC 34.5 11/20/2023 0901   RDW 13.5 11/20/2023 0901   LYMPHSABS 1.3 11/20/2023 0901   MONOABS 0.4 11/20/2023 0901   EOSABS 0.1 11/20/2023 0901   BASOSABS 0.0 11/20/2023 0901   Recent Labs    11/20/23 0901  HGB 14.6    CMP     Component Value Date/Time   NA 139 11/20/2023 0901   K 4.2 11/20/2023 0901   CL 104 11/20/2023 0901   CO2 28 11/20/2023 0901   GLUCOSE 140 (H) 11/20/2023 0901   BUN 11 11/20/2023 0901   CREATININE 0.97 11/20/2023 0901   CALCIUM 9.1 11/20/2023 0901   PROT 6.3 11/20/2023 0901   ALBUMIN 4.1 11/20/2023 0901   AST 14 11/20/2023 0901   ALT 15 11/20/2023 0901   ALKPHOS 68 11/20/2023 0901   BILITOT 0.9 11/20/2023 0901   GFRNONAA >60 03/25/2019 1550   GFRAA >60 03/25/2019 1550      Latest Ref Rng & Units 11/20/2023    9:01 AM 03/25/2019    3:50 PM 01/16/2019    3:01 AM  Hepatic Function  Total Protein 6.0 - 8.3 g/dL 6.3  7.3  5.1   Albumin 3.5 - 5.2 g/dL 4.1  4.4  2.5   AST 0 - 37 U/L 14  18  20    ALT 0 - 53 U/L 15  17  37   Alk Phosphatase 39 - 117 U/L 68  137  60   Total Bilirubin 0.2 - 1.2 mg/dL 0.9  0.7  0.7       Current Medications:   Current Outpatient Medications (Endocrine & Metabolic):    ibandronate (BONIVA) 150 MG tablet, Take 150 mg by mouth every 30 (thirty) days. Take in the morning with a full glass of water , on an empty stomach, and do not take anything else by mouth or lie down for the next 30 min.   sitaGLIPtin (JANUVIA) 100 MG tablet, Take 100 mg by mouth daily.  Current Outpatient Medications  (Cardiovascular):    amLODipine (NORVASC) 5 MG tablet, Take 5 mg by mouth daily.   benazepril  (LOTENSIN ) 40 MG tablet, Take 40 mg by mouth daily.   cholestyramine (QUESTRAN) 4 g packet, Take 4 g by mouth as needed.   simvastatin  (ZOCOR ) 20 MG tablet, Take 20 mg by mouth at bedtime.   colestipol  (COLESTID ) 1 g tablet, TAKE 2 TABLETS BY MOUTH 2 TIMES DAILY. (Patient not taking: Reported on 01/06/2024)  Current Outpatient Medications (Respiratory):    fluticasone (FLONASE) 50 MCG/ACT nasal spray, SPRAY 2 SPRAYS IN EACH NOSTRIL DAILY 30 DAY(S) (Patient taking differently: as needed.)  Current Outpatient Medications (Analgesics):    acetaminophen  (TYLENOL ) 325 MG tablet, take 2 tablets by mouth every 6 hours prn mild pain fever or headache  Current Outpatient Medications (Other):    methocarbamol  (ROBAXIN ) 500 MG tablet, 1 tablet (Patient taking differently: as needed.)   mirtazapine (REMERON) 15  MG tablet, Take 15 mg by mouth at bedtime. (Patient taking differently: Take 15 mg by mouth as needed.)   mupirocin ointment (BACTROBAN) 2 %, Apply 1 application topically daily.   omeprazole (PRILOSEC) 20 MG capsule, Take 20 mg by mouth daily.   trolamine salicylate (ASPERCREME) 10 % cream, Apply 1 application topically as needed for muscle pain.   dicyclomine  (BENTYL ) 10 MG capsule, Take 1 capsule (10 mg total) by mouth 3 (three) times daily as needed for spasms (for AB cramping). (Patient not taking: Reported on 01/06/2024)  Medical History:  Past Medical History:  Diagnosis Date   Acute cholecystitis 01/2019   Arthritis    Diabetes mellitus 2007   type 2   Diverticulitis    GERD (gastroesophageal reflux disease)    02/27/2019- not currently   Hypercholesterolemia since 2008 preventative   taking Zocor , now cholesterol WNL   Hypertension since 1970   Prostate CA (HCC)    Shingles    Allergies:  Allergies  Allergen Reactions   Escitalopram Oxalate     Other reaction(s): may have caused  nightmares.   Iodine Hives   Iohexol  Hives        Lexapro [Escitalopram]      Surgical History:  He  has a past surgical history that includes Tonsillectomy (1960); Inguinal hernia repair (Bilateral, 1970); Appendectomy (1960); Back surgery (1960 & 2009); Prostate surgery; Knee surgery (Right, 11/2018); Cholecystectomy (N/A, 01/13/2019); ERCP (N/A, 01/14/2019); sphincterotomy (01/14/2019); biliary stent placement (01/14/2019); ERCP (N/A, 02/23/2019); Stent removal (02/23/2019); Colonoscopy; and Anterior cervical decomp/discectomy fusion (N/A, 03/02/2019). Family History:  His family history includes Lung cancer in his father; Prostate cancer in his brother.  REVIEW OF SYSTEMS  : All other systems reviewed and negative except where noted in the History of Present Illness.  PHYSICAL EXAM: BP 136/78   Pulse 65   Ht 5' 8 (1.727 m)   Wt 165 lb 8 oz (75.1 kg)   BMI 25.16 kg/m  Physical Exam   GENERAL APPEARANCE: Well nourished, in no apparent distress. HEENT: No cervical lymphadenopathy, unremarkable thyroid, sclerae anicteric, conjunctiva pink. RESPIRATORY: Respiratory effort normal, breath sounds equal bilaterally without rales, rhonchi, or wheezing. Lungs clear to auscultation bilaterally. CARDIO: Regular rate and rhythm with no murmurs, rubs, or gallops, peripheral pulses intact. ABDOMEN: Soft, non-distended, active bowel sounds in all four quadrants, right lower abdomen tender to palpation, no rebound, no mass appreciated. RECTAL: Declines. MUSCULOSKELETAL: Full range of motion, normal gait, without edema. SKIN: Dry, intact without rashes or lesions. No jaundice. NEURO: Alert, oriented, no focal deficits. PSYCH: Cooperative, normal mood and affect.      Alan JONELLE Coombs, PA-C 11:37 AM   "

## 2024-01-06 ENCOUNTER — Ambulatory Visit: Admitting: Physician Assistant

## 2024-01-06 ENCOUNTER — Encounter: Payer: Self-pay | Admitting: Physician Assistant

## 2024-01-06 VITALS — BP 136/78 | HR 65 | Ht 68.0 in | Wt 165.5 lb

## 2024-01-06 DIAGNOSIS — R198 Other specified symptoms and signs involving the digestive system and abdomen: Secondary | ICD-10-CM

## 2024-01-06 DIAGNOSIS — K219 Gastro-esophageal reflux disease without esophagitis: Secondary | ICD-10-CM | POA: Diagnosis not present

## 2024-01-06 DIAGNOSIS — R1030 Lower abdominal pain, unspecified: Secondary | ICD-10-CM

## 2024-01-06 DIAGNOSIS — R197 Diarrhea, unspecified: Secondary | ICD-10-CM | POA: Diagnosis not present

## 2024-01-06 DIAGNOSIS — Z860101 Personal history of adenomatous and serrated colon polyps: Secondary | ICD-10-CM | POA: Diagnosis not present

## 2024-01-06 DIAGNOSIS — K5730 Diverticulosis of large intestine without perforation or abscess without bleeding: Secondary | ICD-10-CM

## 2024-01-06 DIAGNOSIS — R1031 Right lower quadrant pain: Secondary | ICD-10-CM

## 2024-01-06 DIAGNOSIS — Z9049 Acquired absence of other specified parts of digestive tract: Secondary | ICD-10-CM

## 2024-01-06 NOTE — Patient Instructions (Signed)
 Try salon pas patches from over the counter.  Call if any new or worsening symptoms.     FODMAP stands for fermentable oligo-, di-, mono-saccharides and polyols (1). These are the scientific terms used to classify groups of carbs that are difficult for our body to digest and that are notorious for triggering digestive symptoms like bloating, gas, loose stools and stomach pain.   You can try low FODMAP diet  - start with eliminating just one column at a time that you feel may be a trigger for you. - the table at the very bottom contains foods that are low in FODMAPs   Sometimes trying to eliminate the FODMAP's from your diet is difficult or tricky, if you are stuggling with trying to do the elimination diet you can try an enzyme.  There is a food enzymes that you sprinkle in or on your food that helps break down the FODMAP. You can read more about the enzyme by going to this site: https://fodzyme.com/

## 2024-02-03 ENCOUNTER — Ambulatory Visit: Admitting: Podiatry

## 2024-02-05 ENCOUNTER — Ambulatory Visit: Admitting: Podiatry

## 2024-02-05 DIAGNOSIS — L84 Corns and callosities: Secondary | ICD-10-CM

## 2024-02-05 DIAGNOSIS — M79675 Pain in left toe(s): Secondary | ICD-10-CM | POA: Diagnosis not present

## 2024-02-05 DIAGNOSIS — M79674 Pain in right toe(s): Secondary | ICD-10-CM | POA: Diagnosis not present

## 2024-02-05 DIAGNOSIS — E1151 Type 2 diabetes mellitus with diabetic peripheral angiopathy without gangrene: Secondary | ICD-10-CM

## 2024-02-05 DIAGNOSIS — B351 Tinea unguium: Secondary | ICD-10-CM | POA: Diagnosis not present

## 2024-02-05 NOTE — Progress Notes (Signed)
"   °  °  Subjective:  Patient ID: Jarryd E Fang, male    DOB: 12/12/1935,  MRN: [id number]  Jamse FORBES Moats presents to clinic today for:  Chief Complaint  Patient presents with   [facility]    [facility] with callus. Started using aquafor on his feet and this has helped the dry skin. A1c is unknown but said is was high.  No anti coag.    Patient notes nails are thick and elongated, causing pain in shoe gear when ambulating.  He has small pinch calluses on bilateral hallux IPJ's  PCP is Shayne Anes, MD. last seen around 01/20/2024  Past Medical History:  Diagnosis Date   Acute cholecystitis 01/2019   Arthritis    Diabetes mellitus 2007   type 2   Diverticulitis    GERD (gastroesophageal reflux disease)    02/27/2019- not currently   Hypercholesterolemia since 2008 preventative   taking Zocor , now cholesterol WNL   Hypertension since 1970   Prostate CA (HCC)    Shingles    Allergies[1]  Objective:  Bronson E Bronaugh is a pleasant 89 y.o. male in NAD. AAO x 3.  Vascular Examination: Patient has palpable DP pulse, absent PT pulse bilateral.  Delayed capillary refill bilateral toes.  Sparse digital hair bilateral.  Proximal to distal cooling WNL bilateral.    Dermatological Examination: Interspaces are clear with no open lesions noted bilateral.  Skin is shiny and atrophic bilateral.  Nails are 3-30mm thick, with yellowish/brown discoloration, subungual debris and distal onycholysis x10.  There is pain with compression of nails x10.  There are small hyperkeratotic lesions noted b/l hallux IPJ .  Patient qualifies for at-risk foot care because of diabetes and PVD.  Assessment/Plan: 1. Pain due to onychomycosis of toenails of both feet   2. Type II diabetes mellitus with peripheral circulatory disorder (HCC)   3. Callus of foot     Mycotic nails x10 were sharply debrided with sterile nail nippers and power debriding burr to decrease bulk and length.  Hyperkeratotic lesions were sanded with the  power bur.  Follow-up 3 months   Kushal Saunders DSABRA [place], DPM, FACFAS [facility]     [address] [place], [place] [address]                Office [phone]  Fax [phone]     [1]  Allergies Allergen Reactions   Escitalopram Oxalate     Other reaction(s): may have caused nightmares.   Iodine Hives   Iohexol  Hives        Lexapro [Escitalopram]    "

## 2024-05-06 ENCOUNTER — Ambulatory Visit: Admitting: Podiatry
